# Patient Record
Sex: Female | Born: 1945
Health system: Southern US, Community
[De-identification: ages and names within clinical notes are randomized; demographics above are authoritative.]

## PROBLEM LIST (undated history)

## (undated) DIAGNOSIS — E785 Hyperlipidemia, unspecified: Secondary | ICD-10-CM

## (undated) DIAGNOSIS — F419 Anxiety disorder, unspecified: Secondary | ICD-10-CM

## (undated) DIAGNOSIS — R569 Unspecified convulsions: Secondary | ICD-10-CM

## (undated) DIAGNOSIS — I82409 Acute embolism and thrombosis of unspecified deep veins of unspecified lower extremity: Secondary | ICD-10-CM

## (undated) HISTORY — DX: Hyperlipidemia, unspecified: E78.5

## (undated) HISTORY — DX: Anxiety disorder, unspecified: F41.9

## (undated) HISTORY — PX: TUMOR EXCISION: SHX421

## (undated) HISTORY — DX: Unspecified convulsions: R56.9

---

## 2009-07-11 ENCOUNTER — Encounter: Admission: RE | Admit: 2009-07-11 | Discharge: 2009-07-11 | Payer: Self-pay | Admitting: Family Medicine

## 2010-02-25 ENCOUNTER — Encounter: Payer: Self-pay | Admitting: Gastroenterology

## 2012-02-27 DIAGNOSIS — D329 Benign neoplasm of meninges, unspecified: Secondary | ICD-10-CM | POA: Insufficient documentation

## 2012-11-12 ENCOUNTER — Other Ambulatory Visit: Payer: Self-pay | Admitting: *Deleted

## 2012-11-12 MED ORDER — ATORVASTATIN CALCIUM 40 MG PO TABS: 40.0000 mg | ORAL_TABLET | Freq: Every day | ORAL | Status: DC

## 2012-11-12 NOTE — Telephone Encounter (Signed)
LAST LABS 1/14. DISCUSSED LIPTRUZET ON LAB NOTE BUT PT STILL TAKING LIPITOR. LAST RF 10/11/12. NTBS

## 2012-11-12 NOTE — Telephone Encounter (Signed)
Need to know what pharmacy she uses to refill  lipitor

## 2013-01-05 ENCOUNTER — Encounter (INDEPENDENT_AMBULATORY_CARE_PROVIDER_SITE_OTHER): Payer: Self-pay

## 2013-01-05 ENCOUNTER — Encounter: Payer: Self-pay | Admitting: Family Medicine

## 2013-01-05 ENCOUNTER — Ambulatory Visit (INDEPENDENT_AMBULATORY_CARE_PROVIDER_SITE_OTHER): Payer: Medicare Other | Admitting: Family Medicine

## 2013-01-05 VITALS — BP 141/83 | HR 97 | Temp 96.5°F | Wt 171.0 lb

## 2013-01-05 DIAGNOSIS — J069 Acute upper respiratory infection, unspecified: Secondary | ICD-10-CM

## 2013-01-05 DIAGNOSIS — Z0000 Encounter for general adult medical examination without abnormal findings: Secondary | ICD-10-CM

## 2013-01-05 DIAGNOSIS — E785 Hyperlipidemia, unspecified: Secondary | ICD-10-CM

## 2013-01-05 MED ORDER — ATORVASTATIN CALCIUM 40 MG PO TABS: 40.0000 mg | ORAL_TABLET | Freq: Every day | ORAL | Status: DC

## 2013-01-05 MED ORDER — BENZONATATE 100 MG PO CAPS: 200.0000 mg | ORAL_CAPSULE | Freq: Three times a day (TID) | ORAL | Status: DC | PRN

## 2013-01-05 MED ORDER — AZITHROMYCIN 250 MG PO TABS: ORAL_TABLET | ORAL | Status: DC

## 2013-01-05 NOTE — Progress Notes (Signed)
   Subjective:    Patient ID: Luna Glasgow, female    DOB: [birth year], 67 y.o.   MRN: [id number]  HPI   This 67 y.o. female presents for evaluation of URI sx's and head congestion. Review of Systems No chest pain, SOB, HA, dizziness, vision change, N/V, diarrhea, constipation, dysuria, urinary urgency or frequency, myalgias, arthralgias or rash.     Objective:   Physical Exam  Vital signs noted  Well developed well nourished female.  HEENT - Head atraumatic Normocephalic                Eyes - PERRLA, Conjuctiva - clear Sclera- Clear EOMI                Ears - EAC's Wnl TM's Wnl Gross Hearing WNL                Throat - oropharanx wnl Respiratory - Lungs CTA bilateral Cardiac - RRR S1 and S2 without murmur GI - Abdomen soft Nontender and bowel sounds active x 4 Extremities - No edema. Neuro - Grossly intact.      Assessment & Plan:  URI (upper respiratory infection) - Plan: azithromycin (ZITHROMAX) 250 MG tablet, benzonatate (TESSALON PERLES) 100 MG capsule, CBC With differential/Platelet  Routine general medical examination at a health care facility - Plan: CMP14+EGFR, Lipid panel, Thyroid Panel With TSH, Vit D  25 hydroxy (rtn osteoporosis monitoring), CBC With differential/Platelet, CANCELED: POCT CBC  Hyperlipidemia - Plan: atorvastatin (LIPITOR) 40 MG tablet, CBC With differential/Platelet, DISCONTINUED: atorvastatin (LIPITOR) 40 MG tablet  Deatra Canter FNP

## 2013-01-06 ENCOUNTER — Other Ambulatory Visit: Payer: Self-pay | Admitting: Family Medicine

## 2013-01-06 LAB — CBC WITH DIFFERENTIAL
Basophils Absolute: 0 10*3/uL (ref 0.0–0.2)
Basos: 0 %
Eos: 2 %
Eosinophils Absolute: 0.2 10*3/uL (ref 0.0–0.4)
HCT: 35.1 % (ref 34.0–46.6)
Hemoglobin: 12 g/dL (ref 11.1–15.9)
Immature Grans (Abs): 0 10*3/uL (ref 0.0–0.1)
Immature Granulocytes: 0 %
Lymphocytes Absolute: 2.3 10*3/uL (ref 0.7–3.1)
Lymphs: 27 %
MCH: 27.3 pg (ref 26.6–33.0)
MCHC: 34.2 g/dL (ref 31.5–35.7)
MCV: 80 fL (ref 79–97)
Monocytes Absolute: 0.8 10*3/uL (ref 0.1–0.9)
Monocytes: 9 %
Neutrophils Absolute: 5.2 10*3/uL (ref 1.4–7.0)
Neutrophils Relative %: 62 %
Platelets: 241 10*3/uL (ref 150–379)
RBC: 4.4 x10E6/uL (ref 3.77–5.28)
RDW: 15.6 % — ABNORMAL HIGH (ref 12.3–15.4)
WBC: 8.5 10*3/uL (ref 3.4–10.8)

## 2013-01-06 LAB — CMP14+EGFR
ALT: 62 IU/L — ABNORMAL HIGH (ref 0–32)
AST: 49 IU/L — ABNORMAL HIGH (ref 0–40)
Albumin/Globulin Ratio: 1.5 (ref 1.1–2.5)
Albumin: 4.1 g/dL (ref 3.6–4.8)
Alkaline Phosphatase: 48 IU/L (ref 39–117)
BUN/Creatinine Ratio: 22 (ref 11–26)
BUN: 14 mg/dL (ref 8–27)
CO2: 25 mmol/L (ref 18–29)
Calcium: 10.1 mg/dL (ref 8.6–10.2)
Chloride: 101 mmol/L (ref 97–108)
Creatinine, Ser: 0.64 mg/dL (ref 0.57–1.00)
GFR calc Af Amer: 107 mL/min/{1.73_m2} (ref >59)
GFR calc non Af Amer: 93 mL/min/{1.73_m2} (ref >59)
Globulin, Total: 2.8 g/dL (ref 1.5–4.5)
Glucose: 98 mg/dL (ref 65–99)
Potassium: 5.4 mmol/L — ABNORMAL HIGH (ref 3.5–5.2)
Sodium: 141 mmol/L (ref 134–144)
Total Bilirubin: 0.2 mg/dL (ref 0.0–1.2)
Total Protein: 6.9 g/dL (ref 6.0–8.5)

## 2013-01-06 LAB — LIPID PANEL
Chol/HDL Ratio: 3.2 ratio units (ref 0.0–4.4)
Cholesterol, Total: 191 mg/dL (ref 100–199)
HDL: 60 mg/dL (ref >39)
LDL Calculated: 104 mg/dL — ABNORMAL HIGH (ref 0–99)
Triglycerides: 135 mg/dL (ref 0–149)
VLDL Cholesterol Cal: 27 mg/dL (ref 5–40)

## 2013-01-06 LAB — THYROID PANEL WITH TSH
Free Thyroxine Index: 2.2 (ref 1.2–4.9)
T3 Uptake Ratio: 28 % (ref 24–39)
T4, Total: 7.9 ug/dL (ref 4.5–12.0)
TSH: 1.83 u[IU]/mL (ref 0.450–4.500)

## 2013-01-06 LAB — VITAMIN D 25 HYDROXY (VIT D DEFICIENCY, FRACTURES): Vit D, 25-Hydroxy: 20.1 ng/mL — ABNORMAL LOW (ref 30.0–100.0)

## 2013-01-06 MED ORDER — VITAMIN D (ERGOCALCIFEROL) 1.25 MG (50000 UNIT) PO CAPS: 50000.0000 [IU] | ORAL_CAPSULE | ORAL | Status: DC

## 2013-01-06 NOTE — Patient Instructions (Signed)

## 2013-01-25 ENCOUNTER — Telehealth: Payer: Self-pay | Admitting: *Deleted

## 2013-01-25 NOTE — Telephone Encounter (Signed)
Message copied by Baltazar Apo on Mon Jan 25, 2013  2:23 PM ------      Message from: Deatra Canter      Created: Wed Jan 06, 2013  9:25 AM       Potassium is elevated and would repeat bmp, transaminases elevated so would repeat LFT and vitamin D is low so would take vitamin D otc 1000 iu poqd and will send in a rx for vitamin D q week ------

## 2013-01-25 NOTE — Telephone Encounter (Signed)
Pt notified and verbalized understanding.

## 2013-02-09 ENCOUNTER — Encounter: Payer: Self-pay | Admitting: *Deleted

## 2013-04-15 ENCOUNTER — Encounter: Payer: Self-pay | Admitting: Family Medicine

## 2013-04-15 ENCOUNTER — Ambulatory Visit (INDEPENDENT_AMBULATORY_CARE_PROVIDER_SITE_OTHER): Payer: Medicare HMO | Admitting: Family Medicine

## 2013-04-15 ENCOUNTER — Encounter (INDEPENDENT_AMBULATORY_CARE_PROVIDER_SITE_OTHER): Payer: Self-pay

## 2013-04-15 VITALS — BP 140/76 | HR 91 | Temp 98.1°F | Ht 62.0 in | Wt 172.0 lb

## 2013-04-15 DIAGNOSIS — J069 Acute upper respiratory infection, unspecified: Secondary | ICD-10-CM

## 2013-04-15 MED ORDER — AZITHROMYCIN 250 MG PO TABS: ORAL_TABLET | ORAL | Status: DC

## 2013-04-15 NOTE — Progress Notes (Signed)
   Subjective:    Patient ID: Christina Gonzales, female    DOB: 02/06/1945, 68 y.o.   MRN: [id number]  HPI This 68 y.o. female presents for evaluation of URI sx's for over a week.   Review of Systems No chest pain, SOB, HA, dizziness, vision change, N/V, diarrhea, constipation, dysuria, urinary urgency or frequency, myalgias, arthralgias or rash.     Objective:   Physical Exam  Vital signs noted  Well developed well nourished female.  HEENT - Head atraumatic Normocephalic                Eyes - PERRLA, Conjuctiva - clear Sclera- Clear EOMI                Ears - EAC's Wnl TM's Wnl Gross Hearing WNL                Nose - Nares patent                 Throat - oropharanx wnl Respiratory - Lungs CTA bilateral Cardiac - RRR S1 and S2 without murmur GI - Abdomen soft Nontender and bowel sounds active x 4 Extremities - No edema. Neuro - Grossly intact.      Assessment & Plan:  URI (upper respiratory infection) - Plan: azithromycin (ZITHROMAX) 250 MG tablet Push po fluids, rest, tylenol and motrin otc prn as directed for fever, arthralgias, and myalgias.  Follow up prn if sx's continue or persist. Lysbeth Penner FNP

## 2013-09-01 ENCOUNTER — Ambulatory Visit (INDEPENDENT_AMBULATORY_CARE_PROVIDER_SITE_OTHER): Payer: Medicare HMO | Admitting: Family

## 2013-09-01 ENCOUNTER — Encounter: Payer: Self-pay | Admitting: Family

## 2013-09-01 VITALS — BP 146/66 | HR 74 | Temp 98.0°F | Ht 62.0 in | Wt 180.8 lb

## 2013-09-01 DIAGNOSIS — R569 Unspecified convulsions: Secondary | ICD-10-CM

## 2013-09-01 DIAGNOSIS — Z1321 Encounter for screening for nutritional disorder: Secondary | ICD-10-CM

## 2013-09-01 DIAGNOSIS — E785 Hyperlipidemia, unspecified: Secondary | ICD-10-CM | POA: Insufficient documentation

## 2013-09-01 DIAGNOSIS — I10 Essential (primary) hypertension: Secondary | ICD-10-CM | POA: Insufficient documentation

## 2013-09-01 MED ORDER — HYDROCHLOROTHIAZIDE 12.5 MG PO TABS: 12.5000 mg | ORAL_TABLET | Freq: Every day | ORAL | Status: DC

## 2013-09-01 NOTE — Patient Instructions (Signed)
Health Maintenance Adopting a healthy lifestyle and getting preventive care can go a long way to promote health and wellness. Talk with your health care provider about what schedule of regular examinations is right for you. This is a good chance for you to check in with your provider about disease prevention and staying healthy. In between checkups, there are plenty of things you can do on your own. Experts have done a lot of research about which lifestyle changes and preventive measures are most likely to keep you healthy. Ask your health care provider for more information. WEIGHT AND DIET  Eat a healthy diet  Be sure to include plenty of vegetables, fruits, low-fat dairy products, and lean protein.  Do not eat a lot of foods high in solid fats, added sugars, or salt.  Get regular exercise. This is one of the most important things you can do for your health.  Most adults should exercise for at least 150 minutes each week. The exercise should increase your heart rate and make you sweat (moderate-intensity exercise).  Most adults should also do strengthening exercises at least twice a week. This is in addition to the moderate-intensity exercise.  Maintain a healthy weight  Body mass index (BMI) is a measurement that can be used to identify possible weight problems. It estimates body fat based on height and weight. Your health care provider can help determine your BMI and help you achieve or maintain a healthy weight.  For females 25 years of age and older:   A BMI below 18.5 is considered underweight.  A BMI of 18.5 to 24.9 is normal.  A BMI of 25 to 29.9 is considered overweight.  A BMI of 30 and above is considered obese.  Watch levels of cholesterol and blood lipids  You should start having your blood tested for lipids and cholesterol at 68 years of age, then have this test every 5 years.  You may need to have your cholesterol levels checked more often if:  Your lipid or  cholesterol levels are high.  You are older than 68 years of age.  You are at high risk for heart disease.  CANCER SCREENING   Lung Cancer  Lung cancer screening is recommended for adults 97-92 years old who are at high risk for lung cancer because of a history of smoking.  A yearly low-dose CT scan of the lungs is recommended for people who:  Currently smoke.  Have quit within the past 15 years.  Have at least a 30-pack-year history of smoking. A pack year is smoking an average of one pack of cigarettes a day for 1 year.  Yearly screening should continue until it has been 15 years since you quit.  Yearly screening should stop if you develop a health problem that would prevent you from having lung cancer treatment.  Breast Cancer  Practice breast self-awareness. This means understanding how your breasts normally appear and feel.  It also means doing regular breast self-exams. Let your health care provider know about any changes, no matter how small.  If you are in your 20s or 30s, you should have a clinical breast exam (CBE) by a health care provider every 1-3 years as part of a regular health exam.  If you are 76 or older, have a CBE every year. Also consider having a breast X-ray (mammogram) every year.  If you have a family history of breast cancer, talk to your health care provider about genetic screening.  If you are  at high risk for breast cancer, talk to your health care provider about having an MRI and a mammogram every year.  Breast cancer gene (BRCA) assessment is recommended for women who have family members with BRCA-related cancers. BRCA-related cancers include:  Breast.  Ovarian.  Tubal.  Peritoneal cancers.  Results of the assessment will determine the need for genetic counseling and BRCA1 and BRCA2 testing. Cervical Cancer Routine pelvic examinations to screen for cervical cancer are no longer recommended for nonpregnant women who are considered low  risk for cancer of the pelvic organs (ovaries, uterus, and vagina) and who do not have symptoms. A pelvic examination may be necessary if you have symptoms including those associated with pelvic infections. Ask your health care provider if a screening pelvic exam is right for you.   The Pap test is the screening test for cervical cancer for women who are considered at risk.  If you had a hysterectomy for a problem that was not cancer or a condition that could lead to cancer, then you no longer need Pap tests.  If you are older than 65 years, and you have had normal Pap tests for the past 10 years, you no longer need to have Pap tests.  If you have had past treatment for cervical cancer or a condition that could lead to cancer, you need Pap tests and screening for cancer for at least 20 years after your treatment.  If you no longer get a Pap test, assess your risk factors if they change (such as having a new sexual partner). This can affect whether you should start being screened again.  Some women have medical problems that increase their chance of getting cervical cancer. If this is the case for you, your health care provider may recommend more frequent screening and Pap tests.  The human papillomavirus (HPV) test is another test that may be used for cervical cancer screening. The HPV test looks for the virus that can cause cell changes in the cervix. The cells collected during the Pap test can be tested for HPV.  The HPV test can be used to screen women 30 years of age and older. Getting tested for HPV can extend the interval between normal Pap tests from three to five years.  An HPV test also should be used to screen women of any age who have unclear Pap test results.  After 68 years of age, women should have HPV testing as often as Pap tests.  Colorectal Cancer  This type of cancer can be detected and often prevented.  Routine colorectal cancer screening usually begins at 68 years of  age and continues through 68 years of age.  Your health care provider may recommend screening at an earlier age if you have risk factors for colon cancer.  Your health care provider may also recommend using home test kits to check for hidden blood in the stool.  A small camera at the end of a tube can be used to examine your colon directly (sigmoidoscopy or colonoscopy). This is done to check for the earliest forms of colorectal cancer.  Routine screening usually begins at age 50.  Direct examination of the colon should be repeated every 5-10 years through 68 years of age. However, you may need to be screened more often if early forms of precancerous polyps or small growths are found. Skin Cancer  Check your skin from head to toe regularly.  Tell your health care provider about any new moles or changes in   moles, especially if there is a change in a mole's shape or color.  Also tell your health care provider if you have a mole that is larger than the size of a pencil eraser.  Always use sunscreen. Apply sunscreen liberally and repeatedly throughout the day.  Protect yourself by wearing long sleeves, pants, a wide-brimmed hat, and sunglasses whenever you are outside. HEART DISEASE, DIABETES, AND HIGH BLOOD PRESSURE   Have your blood pressure checked at least every 1-2 years. High blood pressure causes heart disease and increases the risk of stroke.  If you are between 75 years and 42 years old, ask your health care provider if you should take aspirin to prevent strokes.  Have regular diabetes screenings. This involves taking a blood sample to check your fasting blood sugar level.  If you are at a normal weight and have a low risk for diabetes, have this test once every three years after 68 years of age.  If you are overweight and have a high risk for diabetes, consider being tested at a younger age or more often. PREVENTING INFECTION  Hepatitis B  If you have a higher risk for  hepatitis B, you should be screened for this virus. You are considered at high risk for hepatitis B if:  You were born in a country where hepatitis B is common. Ask your health care provider which countries are considered high risk.  Your parents were born in a high-risk country, and you have not been immunized against hepatitis B (hepatitis B vaccine).  You have HIV or AIDS.  You use needles to inject street drugs.  You live with someone who has hepatitis B.  You have had sex with someone who has hepatitis B.  You get hemodialysis treatment.  You take certain medicines for conditions, including cancer, organ transplantation, and autoimmune conditions. Hepatitis C  Blood testing is recommended for:  Everyone born from [birth year] through 1965.  Anyone with known risk factors for hepatitis C. Sexually transmitted infections (STIs)  You should be screened for sexually transmitted infections (STIs) including gonorrhea and chlamydia if:  You are sexually active and are younger than 68 years of age.  You are older than 68 years of age and your health care provider tells you that you are at risk for this type of infection.  Your sexual activity has changed since you were last screened and you are at an increased risk for chlamydia or gonorrhea. Ask your health care provider if you are at risk.  If you do not have HIV, but are at risk, it may be recommended that you take a prescription medicine daily to prevent HIV infection. This is called pre-exposure prophylaxis (PrEP). You are considered at risk if:  You are sexually active and do not regularly use condoms or know the HIV status of your partner(s).  You take drugs by injection.  You are sexually active with a partner who has HIV. Talk with your health care provider about whether you are at high risk of being infected with HIV. If you choose to begin PrEP, you should first be tested for HIV. You should then be tested every 3 months for  as long as you are taking PrEP.  PREGNANCY   If you are premenopausal and you may become pregnant, ask your health care provider about preconception counseling.  If you may become pregnant, take 400 to 800 micrograms (mcg) of folic acid every day.  If you want to prevent pregnancy, talk to your  health care provider about birth control (contraception). OSTEOPOROSIS AND MENOPAUSE   Osteoporosis is a disease in which the bones lose minerals and strength with aging. This can result in serious bone fractures. Your risk for osteoporosis can be identified using a bone density scan.  If you are 65 years of age or older, or if you are at risk for osteoporosis and fractures, ask your health care provider if you should be screened.  Ask your health care provider whether you should take a calcium or vitamin D supplement to lower your risk for osteoporosis.  Menopause may have certain physical symptoms and risks.  Hormone replacement therapy may reduce some of these symptoms and risks. Talk to your health care provider about whether hormone replacement therapy is right for you.  HOME CARE INSTRUCTIONS   Schedule regular health, dental, and eye exams.  Stay current with your immunizations.   Do not use any tobacco products including cigarettes, chewing tobacco, or electronic cigarettes.  If you are pregnant, do not drink alcohol.  If you are breastfeeding, limit how much and how often you drink alcohol.  Limit alcohol intake to no more than 1 drink per day for nonpregnant women. One drink equals 12 ounces of beer, 5 ounces of wine, or 1 ounces of hard liquor.  Do not use street drugs.  Do not share needles.  Ask your health care provider for help if you need support or information about quitting drugs.  Tell your health care provider if you often feel depressed.  Tell your health care provider if you have ever been abused or do not feel safe at home. Document Released: 08/06/2010  Document Revised: 06/07/2013 Document Reviewed: 12/23/2012 Exit[employer] Patient Information 2015 [employer]. This information is not intended to replace advice given to you by your health care provider. Make sure you discuss any questions you have with your health care provider. Hypertension Hypertension, commonly called high blood pressure, is when the force of blood pumping through your arteries is too strong. Your arteries are the blood vessels that carry blood from your heart throughout your body. A blood pressure reading consists of a higher number over a lower number, such as 110/72. The higher number (systolic) is the pressure inside your arteries when your heart pumps. The lower number (diastolic) is the pressure inside your arteries when your heart relaxes. Ideally you want your blood pressure below 120/80. Hypertension forces your heart to work harder to pump blood. Your arteries may become narrow or stiff. Having hypertension puts you at risk for heart disease, stroke, and other problems.  RISK FACTORS Some risk factors for high blood pressure are controllable. Others are not.  Risk factors you cannot control include:   Race. You may be at higher risk if you are African American.  Age. Risk increases with age.  Gender. Men are at higher risk than women before age 45 years. After age 65, women are at higher risk than men. Risk factors you can control include:  Not getting enough exercise or physical activity.  Being overweight.  Getting too much fat, sugar, calories, or salt in your diet.  Drinking too much alcohol. SIGNS AND SYMPTOMS Hypertension does not usually cause signs or symptoms. Extremely high blood pressure (hypertensive crisis) may cause headache, anxiety, shortness of breath, and nosebleed. DIAGNOSIS  To check if you have hypertension, your health care provider will measure your blood pressure while you are seated, with your arm held at the level of your heart. It    should be measured at least twice using the same arm. Certain conditions can cause a difference in blood pressure between your right and left arms. A blood pressure reading that is higher than normal on one occasion does not mean that you need treatment. If one blood pressure reading is high, ask your health care provider about having it checked again. TREATMENT  Treating high blood pressure includes making lifestyle changes and possibly taking medicine. Living a healthy lifestyle can help lower high blood pressure. You may need to change some of your habits. Lifestyle changes may include:  Following the DASH diet. This diet is high in fruits, vegetables, and whole grains. It is low in salt, red meat, and added sugars.  Getting at least 2 hours of brisk physical activity every week.  Losing weight if necessary.  Not smoking.  Limiting alcoholic beverages.  Learning ways to reduce stress. If lifestyle changes are not enough to get your blood pressure under control, your health care provider may prescribe medicine. You may need to take more than one. Work closely with your health care provider to understand the risks and benefits. HOME CARE INSTRUCTIONS  Have your blood pressure rechecked as directed by your health care provider.   Take medicines only as directed by your health care provider. Follow the directions carefully. Blood pressure medicines must be taken as prescribed. The medicine does not work as well when you skip doses. Skipping doses also puts you at risk for problems.   Do not smoke.   Monitor your blood pressure at home as directed by your health care provider. SEEK MEDICAL CARE IF:   You think you are having a reaction to medicines taken.  You have recurrent headaches or feel dizzy.  You have swelling in your ankles.  You have trouble with your vision. SEEK IMMEDIATE MEDICAL CARE IF:  You develop a severe headache or confusion.  You have unusual weakness,  numbness, or feel faint.  You have severe chest or abdominal pain.  You vomit repeatedly.  You have trouble breathing. MAKE SURE YOU:   Understand these instructions.  Will watch your condition.  Will get help right away if you are not doing well or get worse. Document Released: 01/21/2005 Document Revised: 06/07/2013 Document Reviewed: 11/13/2012 Exit[employer] Patient Information 2015 [employer]. This information is not intended to replace advice given to you by your health care provider. Make sure you discuss any questions you have with your health care provider.  

## 2013-09-01 NOTE — Progress Notes (Signed)
   Subjective:    Patient ID: Christina Gonzales, female    DOB: Apr 07, 1945, 68 y.o.   MRN: [id number]  Hyperlipidemia This is a chronic problem. The current episode started more than 1 year ago. The problem is uncontrolled. Recent lipid tests were reviewed and are high. She has no history of diabetes or hypothyroidism. Pertinent negatives include no shortness of breath. Current antihyperlipidemic treatment includes diet change. The current treatment provides no improvement of lipids. Compliance problems include adherence to diet and medication side effects.  Risk factors for coronary artery disease include dyslipidemia, hypertension and post-menopausal.  Hypertension This is a new problem. The current episode started more than 1 month ago. The problem has been waxing and waning since onset. The problem is uncontrolled. Pertinent negatives include no anxiety, headaches, palpitations, peripheral edema or shortness of breath. Risk factors for coronary artery disease include dyslipidemia, obesity and post-menopausal state. Past treatments include nothing. The current treatment provides no improvement. There is no history of kidney disease, CAD/MI, heart failure or a thyroid problem. There is no history of sleep apnea.  Pt has hx of seizures-States last seizure was 8 months ago. Pt has a neurologists in [place] that manages her keppra doses.     Review of Systems  Constitutional: Negative.   HENT: Negative.   Eyes: Negative.   Respiratory: Negative.  Negative for shortness of breath.   Cardiovascular: Negative.  Negative for palpitations.  Gastrointestinal: Negative.   Endocrine: Negative.   Genitourinary: Negative.   Musculoskeletal: Negative.   Neurological: Negative.  Negative for headaches.  Hematological: Negative.   Psychiatric/Behavioral: Negative.   All other systems reviewed and are negative.      Objective:   Physical Exam  Vitals reviewed. Constitutional: She is oriented  to person, place, and time. She appears well-developed and well-nourished. No distress.  HENT:  Head: Normocephalic and atraumatic.  Right Ear: External ear normal.  Mouth/Throat: Oropharynx is clear and moist.  Eyes: Pupils are equal, round, and reactive to light.  Neck: Normal range of motion. Neck supple. No thyromegaly present.  Cardiovascular: Normal rate, regular rhythm, normal heart sounds and intact distal pulses.   No murmur heard. Pulmonary/Chest: Effort normal and breath sounds normal. No respiratory distress. She has no wheezes.  Abdominal: Soft. Bowel sounds are normal. She exhibits no distension. There is no tenderness.  Musculoskeletal: Normal range of motion. She exhibits no edema and no tenderness.  Neurological: She is alert and oriented to person, place, and time. She has normal reflexes. No cranial nerve deficit.  Skin: Skin is warm and dry.  Psychiatric: She has a normal mood and affect. Her behavior is normal. Judgment and thought content normal.    BP 146/66  Pulse 74  Temp(Src) 98 F (36.7 C) (Oral)  Ht _0  (1.575 m)  Wt 180 lb 12.8 oz (82.01 kg)  BMI 33.06 kg/m2       Assessment & Plan:  1. Hyperlipidemia - CMP14+EGFR; Future - Lipid panel  2. Essential hypertension, benign - CMP14+EGFR; Future - hydrochlorothiazide (HYDRODIURIL) 12.5 MG tablet; Take 1 tablet (12.5 mg total) by mouth daily.  Dispense: 90 tablet; Refill: 3  3. Seizures - CMP14+EGFR; Future  4. Encounter for vitamin deficiency screening - Vit D  25 hydroxy (rtn osteoporosis monitoring); Future   Continue all meds Labs pending Health Maintenance reviewed Diet and exercise encouraged RTO 2 weeks for blood pressure  [name], FNP

## 2013-11-12 ENCOUNTER — Telehealth: Payer: Self-pay | Admitting: Family Medicine

## 2013-11-16 ENCOUNTER — Ambulatory Visit (INDEPENDENT_AMBULATORY_CARE_PROVIDER_SITE_OTHER): Payer: Medicare HMO | Admitting: Family

## 2013-11-16 ENCOUNTER — Encounter: Payer: Self-pay | Admitting: Family

## 2013-11-16 VITALS — BP 157/79 | HR 79 | Temp 97.8°F | Ht 62.0 in | Wt 180.8 lb

## 2013-11-16 DIAGNOSIS — I10 Essential (primary) hypertension: Secondary | ICD-10-CM

## 2013-11-16 MED ORDER — LISINOPRIL 20 MG PO TABS: 20.0000 mg | ORAL_TABLET | Freq: Every day | ORAL | Status: DC

## 2013-11-16 NOTE — Patient Instructions (Signed)

## 2013-11-16 NOTE — Progress Notes (Signed)
   Subjective:    Patient ID: Christina Gonzales, female    DOB: 05/14/1945, 68 y.o.   MRN: [id number]  Hypertension This is a recurrent problem. The current episode started more than 1 month ago. The problem has been waxing and waning since onset. The problem is uncontrolled. Pertinent negatives include no blurred vision, headaches, palpitations, peripheral edema or shortness of breath. Risk factors for coronary artery disease include dyslipidemia, obesity, post-menopausal state and family history. Past treatments include diuretics. The current treatment provides mild improvement. There is no history of kidney disease, CAD/MI, CVA, heart failure or a thyroid problem. There is no history of sleep apnea.      Review of Systems  Constitutional: Negative.   HENT: Negative.   Eyes: Negative.  Negative for blurred vision.  Respiratory: Negative.  Negative for shortness of breath.   Cardiovascular: Negative.  Negative for palpitations.  Gastrointestinal: Negative.   Endocrine: Negative.   Genitourinary: Negative.   Musculoskeletal: Negative.   Neurological: Negative.  Negative for headaches.  Hematological: Negative.   Psychiatric/Behavioral: Negative.   All other systems reviewed and are negative.      Objective:   Physical Exam  Vitals reviewed. Constitutional: She is oriented to person, place, and time. She appears well-developed and well-nourished. No distress.  Eyes: Pupils are equal, round, and reactive to light.  Neck: Normal range of motion. Neck supple. No thyromegaly present.  Cardiovascular: Normal rate, regular rhythm, normal heart sounds and intact distal pulses.   No murmur heard. Pulmonary/Chest: Effort normal and breath sounds normal. No respiratory distress. She has no wheezes.  Abdominal: Soft. Bowel sounds are normal. She exhibits no distension. There is no tenderness.  Musculoskeletal: Normal range of motion. She exhibits no edema and no tenderness.  Neurological:  She is alert and oriented to person, place, and time. She has normal reflexes. No cranial nerve deficit.  Skin: Skin is warm and dry.  Psychiatric: She has a normal mood and affect. Her behavior is normal. Judgment and thought content normal.    BP 157/79  Pulse 79  Temp(Src) 97.8 F (36.6 C)  Ht 5\' 2"  (1.575 m)  Wt 180 lb 12.8 oz (82.01 kg)  BMI 33.06 kg/m2       Assessment & Plan:  1. Essential hypertension, benign -Daily blood pressure log given with instructions on how to fill out and told to bring to next visit -Dash diet information given -Exercise encouraged - Stress Management  -Continue current meds -RTO in 2 weeks for nurse to check BP - lisinopril (PRINIVIL,ZESTRIL) 20 MG tablet; Take 1 tablet (20 mg total) by mouth daily.  Dispense: 90 tablet; Refill: [place], FNP

## 2013-11-16 NOTE — Telephone Encounter (Signed)
Patient was seen today.

## 2013-11-17 ENCOUNTER — Other Ambulatory Visit: Payer: Medicare HMO

## 2013-12-03 ENCOUNTER — Encounter: Payer: Self-pay | Admitting: Nurse Practitioner

## 2013-12-03 ENCOUNTER — Ambulatory Visit (INDEPENDENT_AMBULATORY_CARE_PROVIDER_SITE_OTHER): Payer: Medicare HMO | Admitting: Nurse Practitioner

## 2013-12-03 VITALS — BP 129/70 | HR 71 | Temp 97.1°F | Ht 62.0 in | Wt 179.6 lb

## 2013-12-03 DIAGNOSIS — I10 Essential (primary) hypertension: Secondary | ICD-10-CM

## 2013-12-03 DIAGNOSIS — Z1321 Encounter for screening for nutritional disorder: Secondary | ICD-10-CM

## 2013-12-03 DIAGNOSIS — E785 Hyperlipidemia, unspecified: Secondary | ICD-10-CM

## 2013-12-03 DIAGNOSIS — R569 Unspecified convulsions: Secondary | ICD-10-CM

## 2013-12-03 NOTE — Progress Notes (Signed)
   Subjective:    Patient ID: Christina Gonzales, female    DOB: [birth year], 68 y.o.   MRN: [id number]  HPI Patient here today for recheck of blood pressure. She was put on lisinopril several weeks ago and needs recheck- SHe says thatshe feels better and energy level has increased- Denies any side effects.    Review of Systems  Constitutional: Negative.   HENT: Negative.   Respiratory: Negative.   Cardiovascular: Negative.   Genitourinary: Negative.   Psychiatric/Behavioral: Negative.   All other systems reviewed and are negative.      Objective:   Physical Exam  Constitutional: She is oriented to person, place, and time. She appears well-developed and well-nourished.  Cardiovascular: Normal rate, regular rhythm and normal heart sounds.   Pulmonary/Chest: Effort normal.  Neurological: She is alert and oriented to person, place, and time.  Skin: Skin is warm and dry.  Psychiatric: She has a normal mood and affect. Her behavior is normal. Judgment and thought content normal.    BP 129/70  Pulse 71  Temp(Src) 97.1 F (36.2 C) (Oral)  Ht 5\' 2"  (1.575 m)  Wt 179 lb 9.6 oz (81.466 kg)  BMI 32.84 kg/m2       Assessment & Plan:  1. Essential hypertension, benign Low fat low NA+ diet  Labs pending Health maintenance reviewed Diet and exercise encouraged Continue all meds Follow up  In 3 months   [place], FNP

## 2013-12-03 NOTE — Addendum Note (Signed)
Addended by: Selmer Dominion on: 12/03/2013 09:48 AM   Modules accepted: Orders

## 2013-12-03 NOTE — Patient Instructions (Signed)

## 2013-12-04 LAB — CMP14+EGFR
A/G RATIO: 1.6 (ref 1.1–2.5)
ALBUMIN: 4.2 g/dL (ref 3.6–4.8)
ALT: 34 IU/L — ABNORMAL HIGH (ref 0–32)
AST: 29 IU/L (ref 0–40)
Alkaline Phosphatase: 33 IU/L — ABNORMAL LOW (ref 39–117)
BILIRUBIN TOTAL: 0.3 mg/dL (ref 0.0–1.2)
BUN / CREAT RATIO: 25 (ref 11–26)
BUN: 18 mg/dL (ref 8–27)
CO2: 21 mmol/L (ref 18–29)
Calcium: 9.2 mg/dL (ref 8.7–10.3)
Chloride: 102 mmol/L (ref 97–108)
Creatinine, Ser: 0.72 mg/dL (ref 0.57–1.00)
GFR, EST AFRICAN AMERICAN: 100 mL/min/{1.73_m2} (ref >59)
GFR, EST NON AFRICAN AMERICAN: 86 mL/min/{1.73_m2} (ref >59)
GLUCOSE: 100 mg/dL — AB (ref 65–99)
Globulin, Total: 2.7 g/dL (ref 1.5–4.5)
Potassium: 4.6 mmol/L (ref 3.5–5.2)
Sodium: 139 mmol/L (ref 134–144)
TOTAL PROTEIN: 6.9 g/dL (ref 6.0–8.5)

## 2013-12-04 LAB — LIPID PANEL
CHOL/HDL RATIO: 4.3 ratio (ref 0.0–4.4)
Cholesterol, Total: 251 mg/dL — ABNORMAL HIGH (ref 100–199)
HDL: 58 mg/dL (ref >39)
LDL Calculated: 167 mg/dL — ABNORMAL HIGH (ref 0–99)
Triglycerides: 130 mg/dL (ref 0–149)
VLDL CHOLESTEROL CAL: 26 mg/dL (ref 5–40)

## 2013-12-04 LAB — VITAMIN D 25 HYDROXY (VIT D DEFICIENCY, FRACTURES): Vit D, 25-Hydroxy: 20 ng/mL — ABNORMAL LOW (ref 30.0–100.0)

## 2013-12-14 ENCOUNTER — Telehealth: Payer: Self-pay | Admitting: Nurse Practitioner

## 2013-12-14 NOTE — Telephone Encounter (Signed)
Kidney and liver function stable ldl are up at 157- low fat diet and exercise Vitamin d low- vitamin d 2000iu OTC daily  Continue current meds- low fat diet and exercise and recheck in 3 months

## 2013-12-14 NOTE — Telephone Encounter (Signed)
Can you review labs for 12/03/13 and send back to pool to call pt?

## 2013-12-15 NOTE — Telephone Encounter (Signed)
Stp reviewed results and provider feedback, advised to follow up in 3 months for repeat labs, pt voiced understanding, will close call-kc

## 2014-01-02 ENCOUNTER — Other Ambulatory Visit: Payer: Self-pay | Admitting: Family

## 2014-01-02 DIAGNOSIS — E785 Hyperlipidemia, unspecified: Secondary | ICD-10-CM

## 2014-01-02 MED ORDER — PRAVASTATIN SODIUM 20 MG PO TABS: 20.0000 mg | ORAL_TABLET | Freq: Every day | ORAL | Status: DC

## 2014-04-06 ENCOUNTER — Ambulatory Visit (INDEPENDENT_AMBULATORY_CARE_PROVIDER_SITE_OTHER): Payer: Medicare HMO | Admitting: Family Medicine

## 2014-04-06 VITALS — BP 148/70 | HR 86 | Temp 98.9°F | Ht 62.0 in | Wt 184.6 lb

## 2014-04-06 DIAGNOSIS — J0101 Acute recurrent maxillary sinusitis: Secondary | ICD-10-CM | POA: Diagnosis not present

## 2014-04-06 MED ORDER — LEVOFLOXACIN 750 MG PO TABS: 750.0000 mg | ORAL_TABLET | Freq: Every day | ORAL | Status: DC

## 2014-04-06 MED ORDER — PSEUDOEPHEDRINE-GUAIFENESIN ER 60-600 MG PO TB12: 1.0000 | ORAL_TABLET | Freq: Two times a day (BID) | ORAL | Status: DC

## 2014-04-06 NOTE — Progress Notes (Signed)
Subjective:  Patient ID: [name], female    DOB: 1945-05-13  Age: 69 y.o. MRN: [id number]  CC: URI   HPI JAZZY PARMER presents for Sinus Pain: Patient complains of congestion, facial pain, headache described as frontal, moderate pressure, nasal congestion,  non productive cough, post nasal drip and sinus pressure with no fever, chills, night sweats or weight loss. Onset of symptoms was 7days ago, gradually worsening since that time. She is drinking moderate amounts of fluids.  Past history is significant for occasionalsinus infections. History Ashyla has a past medical history of Anxiety; Hyperlipidemia; and Seizures.   She has past surgical history that includes Tumor excision.   Her family history is not on file.She reports that she has never smoked. She does not have any smokeless tobacco history on file. Her alcohol and drug histories are not on file.  Current Outpatient Prescriptions on File Prior to Visit  Medication Sig Dispense Refill  . cholecalciferol (VITAMIN D) 1000 UNITS tablet Take 1,000 Units by mouth daily.    [name] levETIRAcetam (KEPPRA) 500 MG tablet Take 500 mg by mouth 2 (two) times daily.     [name] lisinopril (PRINIVIL,ZESTRIL) 20 MG tablet Take 1 tablet (20 mg total) by mouth daily. 90 tablet 3  . Multiple Vitamins tablet Take 1 tablet by mouth.    . Omega-3 1000 MG CAPS Take 1 g by mouth.    . [DISCONTINUED] hydrochlorothiazide (HYDRODIURIL) 12.5 MG tablet Take 1 tablet (12.5 mg total) by mouth daily. 90 tablet 3   No current facility-administered medications on file prior to visit.    ROS Review of Systems  Constitutional: Negative for fever, chills, diaphoresis, appetite change and fatigue.  HENT: Negative for congestion, ear pain, hearing loss, postnasal drip, rhinorrhea, sore throat and trouble swallowing.   Respiratory: Negative for cough, chest tightness and shortness of breath.   Cardiovascular: Negative for chest pain and palpitations.    Gastrointestinal: Negative for abdominal pain.  Musculoskeletal: Negative for arthralgias.  Skin: Negative for rash.    Objective:  BP 148/70 mmHg  Pulse 86  Temp(Src) 98.9 F (37.2 C) (Oral)  Ht 5\' 2"  (1.575 m)  Wt 184 lb 9.6 oz (83.734 kg)  BMI 33.76 kg/m2  BP Readings from Last 3 Encounters:  04/06/14 148/70  12/03/13 129/70  11/16/13 157/79    Wt Readings from Last 3 Encounters:  04/06/14 184 lb 9.6 oz (83.734 kg)  12/03/13 179 lb 9.6 oz (81.466 kg)  11/16/13 180 lb 12.8 oz (82.01 kg)     Physical Exam  Constitutional: She appears well-developed and well-nourished.  HENT:  Head: Normocephalic and atraumatic.  Right Ear: Tympanic membrane and external ear normal. No decreased hearing is noted.  Left Ear: Tympanic membrane and external ear normal. No decreased hearing is noted.  Nose: Mucosal edema present. Right sinus exhibits maxillary sinus tenderness. Right sinus exhibits no frontal sinus tenderness. Left sinus exhibits maxillary sinus tenderness. Left sinus exhibits no frontal sinus tenderness.  Mouth/Throat: No oropharyngeal exudate or posterior oropharyngeal erythema.  Neck: No Brudzinski's sign noted.  Pulmonary/Chest: No respiratory distress. She has no rales. She exhibits no tenderness.  Lymphadenopathy:       Head (right side): No preauricular adenopathy present.       Head (left side): No preauricular adenopathy present.       Right cervical: No superficial cervical adenopathy present.      Left cervical: No superficial cervical adenopathy present.    No results found for: HGBA1C  Lab Results  Component Value Date   WBC 8.5 01/05/2013   HGB 12.0 01/05/2013   HCT 35.1 01/05/2013   PLT 241 01/05/2013   GLUCOSE 100* 12/03/2013   CHOL 251* 12/03/2013   TRIG 130 12/03/2013   HDL 58 12/03/2013   LDLCALC 167* 12/03/2013   ALT 34* 12/03/2013   AST 29 12/03/2013   NA 139 12/03/2013   K 4.6 12/03/2013   CL 102 12/03/2013   CREATININE 0.72 12/03/2013    BUN 18 12/03/2013   CO2 21 12/03/2013   TSH 1.830 01/05/2013    US Venous Imaging Unilateral Left  07/11/2009   Clinical Data: Left leg pain, swelling   LEFT LOWER EXTREMITY VENOUS DUPLEX ULTRASOUND   Technique:  Gray-scale sonography with graded compression, as well as color Doppler and duplex ultrasound were performed to evaluate the deep venous system of the lower extremity from the level of the common femoral vein through the popliteal and proximal calf veins. Spectral Doppler was utilized to evaluate flow at rest and with distal augmentation maneuvers.   Comparison:  None available   Findings:  Normal compressibility of the common femoral, superficial femoral, and popliteal veins is demonstrated, as well as the visualized proximal calf veins.  No filling defects to suggest DVT on grayscale or color Doppler imaging.  Doppler waveforms show normal direction of venous flow, normal respiratory phasicity and response to augmentation.   IMPRESSION: No evidence of lower extremity deep vein thrombosis.  Provider: [place]:   Katerra was seen today for uri.  Diagnoses and all orders for this visit:  Acute recurrent maxillary sinusitis  Other orders -     levofloxacin (LEVAQUIN) 750 MG tablet; Take 1 tablet (750 mg total) by mouth daily. -     pseudoephedrine-guaifenesin (MUCINEX D) 60-600 MG per tablet; Take 1 tablet by mouth every 12 (twelve) hours.  I have discontinued Ms. Wilbon's pyridOXINE and pravastatin. I am also having her start on levofloxacin and pseudoephedrine-guaifenesin. Additionally, I am having her maintain her levETIRAcetam, Multiple Vitamins, Omega-3, cholecalciferol, and lisinopril.  Meds ordered this encounter  Medications  . levofloxacin (LEVAQUIN) 750 MG tablet    Sig: Take 1 tablet (750 mg total) by mouth daily.    Dispense:  5 tablet    Refill:  0  . pseudoephedrine-guaifenesin (MUCINEX D) 60-600 MG per tablet    Sig: Take 1 tablet by  mouth every 12 (twelve) hours.    Dispense:  20 tablet    Refill:  0      Follow-up: Return if symptoms worsen or fail to improve.  Claretta Fraise, M.D.

## 2014-04-07 ENCOUNTER — Telehealth: Payer: Self-pay | Admitting: Family Medicine

## 2014-04-07 MED ORDER — SULFAMETHOXAZOLE-TRIMETHOPRIM 800-160 MG PO TABS: 1.0000 | ORAL_TABLET | Freq: Two times a day (BID) | ORAL | Status: DC

## 2014-04-07 NOTE — Telephone Encounter (Signed)
Patient prescribed Levaquin and it is too expensive. Can you change to something else?

## 2014-04-07 NOTE — Telephone Encounter (Signed)
Done, please tell patient 

## 2014-04-08 NOTE — Telephone Encounter (Signed)
Left detailed message on patients answering machine that another rx was sent to pharmacy

## 2014-09-20 ENCOUNTER — Encounter: Payer: Self-pay | Admitting: Nurse Practitioner

## 2014-09-20 ENCOUNTER — Ambulatory Visit (INDEPENDENT_AMBULATORY_CARE_PROVIDER_SITE_OTHER): Payer: Medicare HMO | Admitting: Nurse Practitioner

## 2014-09-20 VITALS — BP 149/83 | HR 76 | Temp 98.6°F | Ht 62.0 in | Wt 168.4 lb

## 2014-09-20 DIAGNOSIS — R040 Epistaxis: Secondary | ICD-10-CM | POA: Diagnosis not present

## 2014-09-20 DIAGNOSIS — I10 Essential (primary) hypertension: Secondary | ICD-10-CM

## 2014-09-20 DIAGNOSIS — J0100 Acute maxillary sinusitis, unspecified: Secondary | ICD-10-CM | POA: Diagnosis not present

## 2014-09-20 MED ORDER — LISINOPRIL 20 MG PO TABS: 20.0000 mg | ORAL_TABLET | Freq: Every day | ORAL | Status: DC

## 2014-09-20 MED ORDER — AZITHROMYCIN 250 MG PO TABS: ORAL_TABLET | ORAL | Status: DC

## 2014-09-20 NOTE — Patient Instructions (Signed)

## 2014-09-20 NOTE — Progress Notes (Signed)
   Subjective:    Patient ID: Christina Gonzales, female    DOB: 12-20-1945, 69 y.o.   MRN: [id number]  HPI Patient in this evening c/o nose bleed intermittently for 2 weeks- last about 5 minutes when occurs- has nit done anything for it. She also has cough and congestion with slight facial pain.    Review of Systems  Constitutional: Negative for fever and chills.  HENT: Positive for congestion and sinus pressure. Negative for ear pain, postnasal drip, rhinorrhea and sore throat.   Respiratory: Positive for cough (productive at times- clear).   Cardiovascular: Negative.   Gastrointestinal: Negative.   Genitourinary: Negative.   Neurological: Negative.   Psychiatric/Behavioral: Negative.   All other systems reviewed and are negative.      Objective:   Physical Exam  Constitutional: She is oriented to person, place, and time. She appears well-developed and well-nourished.  HENT:  Right Ear: Hearing, tympanic membrane, external ear and ear canal normal.  Left Ear: Hearing, tympanic membrane, external ear and ear canal normal.  Nose: Mucosal edema and rhinorrhea present. Epistaxis is observed.  Mouth/Throat: Uvula is midline, oropharynx is clear and moist and mucous membranes are normal.  Neck: Normal range of motion.  Cardiovascular: Normal rate, regular rhythm and normal heart sounds.   Pulmonary/Chest: Effort normal and breath sounds normal.  Neurological: She is alert and oriented to person, place, and time.  Skin: Skin is warm and dry.  Psychiatric: She has a normal mood and affect. Her behavior is normal. Judgment and thought content normal.   BP 149/83 mmHg  Pulse 76  Temp(Src) 98.6 F (37 C) (Oral)  Ht 5\' 2"  (1.575 m)  Wt 168 lb 6.4 oz (76.386 kg)  BMI 30.79 kg/m2  * cauterization of area in right nostril       Assessment & Plan:  1. Epistaxis Triple antibiotic ointment in nostril Qhs RTO if bleeding still occuring on regular bais  2. Acute maxillary  sinusitis, recurrence not specified 1. Take meds as prescribed 2. Use a cool mist humidifier especially during the winter months and when heat has been humid. 3. Use saline nose sprays frequently 4. Saline irrigations of the nose can be very helpful if done frequently.  * 4X daily for 1 week*  * Use of a nettie pot can be helpful with this. Follow directions with this* 5. Drink plenty of fluids 6. Keep thermostat turn down low 7.For any cough or congestion  Use plain Mucinex- regular strength or max strength is fine   * Children- consult with Pharmacist for dosing 8. For fever or aces or pains- take tylenol or ibuprofen appropriate for age and weight.  * for fevers greater than 101 orally you may alternate ibuprofen and tylenol every  3 hours.   Mary-Margaret Hassell Done, FNP  - azithromycin (ZITHROMAX Z-PAK) 250 MG tablet; As directed  Dispense: 1 each; Refill: 0

## 2014-09-20 NOTE — Addendum Note (Signed)
Addended by: Chevis Pretty on: 09/20/2014 06:27 PM   Modules accepted: Orders

## 2014-09-26 ENCOUNTER — Telehealth: Payer: Self-pay | Admitting: Nurse Practitioner

## 2014-09-26 NOTE — Telephone Encounter (Signed)
Instructed pt to add Mucinex - regular or maximum strength for the cough, had not yet and informed her the shot is Prevnar 13 for pneumonia

## 2014-11-14 ENCOUNTER — Ambulatory Visit (INDEPENDENT_AMBULATORY_CARE_PROVIDER_SITE_OTHER): Payer: Medicare HMO | Admitting: Family

## 2014-11-14 ENCOUNTER — Telehealth: Payer: Self-pay | Admitting: Family

## 2014-11-14 ENCOUNTER — Encounter: Payer: Self-pay | Admitting: Family

## 2014-11-14 VITALS — BP 143/83 | HR 75 | Temp 97.5°F | Ht 62.0 in | Wt 164.2 lb

## 2014-11-14 DIAGNOSIS — Z23 Encounter for immunization: Secondary | ICD-10-CM | POA: Diagnosis not present

## 2014-11-14 DIAGNOSIS — F411 Generalized anxiety disorder: Secondary | ICD-10-CM

## 2014-11-14 DIAGNOSIS — R69 Illness, unspecified: Secondary | ICD-10-CM | POA: Diagnosis not present

## 2014-11-14 MED ORDER — ESCITALOPRAM OXALATE 10 MG PO TABS: 10.0000 mg | ORAL_TABLET | Freq: Every day | ORAL | Status: DC

## 2014-11-14 NOTE — Progress Notes (Signed)
   Subjective:    Patient ID: Christina Gonzales, female    DOB: [birth year], 69 y.o.   MRN: [id number]  HPI Pt presents to the office today for GAD. PT states her daughter's husband died and her mother-in-law died within the last 2-3 months. Pt states her daughter lives out of the state and her daughter is calling the patient for support. Pt states she just feels overwhelmed and feels like she needs something to help keep her calm. Pt states she is trying to get her daughter to move down her with her. Pt states she is worried about her daughter, because she has issues with alcohol. Pt states she does not want her health to be effected.    Review of Systems  Constitutional: Negative.   HENT: Negative.   Eyes: Negative.   Respiratory: Negative.  Negative for shortness of breath.   Cardiovascular: Negative.  Negative for palpitations.  Gastrointestinal: Negative.   Endocrine: Negative.   Genitourinary: Negative.   Musculoskeletal: Negative.   Neurological: Negative.  Negative for headaches.  Hematological: Negative.   Psychiatric/Behavioral: Negative.   All other systems reviewed and are negative.      Objective:   Physical Exam  Constitutional: She is oriented to person, place, and time. She appears well-developed and well-nourished. No distress.  HENT:  Head: Normocephalic and atraumatic.  Right Ear: External ear normal.  Left Ear: External ear normal.  Nose: Nose normal.  Mouth/Throat: Oropharynx is clear and moist.  Eyes: Pupils are equal, round, and reactive to light.  Neck: Normal range of motion. Neck supple. No thyromegaly present.  Cardiovascular: Normal rate, regular rhythm, normal heart sounds and intact distal pulses.   No murmur heard. Pulmonary/Chest: Effort normal and breath sounds normal. No respiratory distress. She has no wheezes.  Abdominal: Soft. Bowel sounds are normal. She exhibits no distension. There is no tenderness.  Musculoskeletal: Normal range of  motion. She exhibits no edema or tenderness.  Neurological: She is alert and oriented to person, place, and time. She has normal reflexes. No cranial nerve deficit.  Skin: Skin is warm and dry.  Psychiatric: She has a normal mood and affect. Her behavior is normal. Judgment and thought content normal.  Vitals reviewed.   BP 143/83 mmHg  Pulse 75  Temp(Src) 97.5 F (36.4 C) (Oral)  Ht 5\' 2"  (1.575 m)  Wt 164 lb 3.2 oz (74.481 kg)  BMI 30.03 kg/m2       Assessment & Plan:  1. GAD (generalized anxiety disorder) -Stress management discussed -PT to take lexapro daily -RTO in 2 months- Will increase lexapro as needed - escitalopram (LEXAPRO) 10 MG tablet; Take 1 tablet (10 mg total) by mouth daily.  Dispense: 90 tablet; Refill: 3  -Prevnar and Flu vaccine given today  [name], FNP

## 2014-11-14 NOTE — Patient Instructions (Signed)
Generalized Anxiety Disorder Generalized anxiety disorder (GAD) is a mental disorder. It interferes with life functions, including relationships, work, and school. GAD is different from normal anxiety, which everyone experiences at some point in their lives in response to specific life events and activities. Normal anxiety actually helps us prepare for and get through these life events and activities. Normal anxiety goes away after the event or activity is over.  GAD causes anxiety that is not necessarily related to specific events or activities. It also causes excess anxiety in proportion to specific events or activities. The anxiety associated with GAD is also difficult to control. GAD can vary from mild to severe. People with severe GAD can have intense waves of anxiety with physical symptoms (panic attacks).  SYMPTOMS The anxiety and worry associated with GAD are difficult to control. This anxiety and worry are related to many life events and activities and also occur more days than not for 6 months or longer. People with GAD also have three or more of the following symptoms (one or more in children):  Restlessness.   Fatigue.  Difficulty concentrating.   Irritability.  Muscle tension.  Difficulty sleeping or unsatisfying sleep. DIAGNOSIS GAD is diagnosed through an assessment by your health care provider. Your health care provider will ask you questions aboutyour mood,physical symptoms, and events in your life. Your health care provider may ask you about your medical history and use of alcohol or drugs, including prescription medicines. Your health care provider may also do a physical exam and blood tests. Certain medical conditions and the use of certain substances can cause symptoms similar to those associated with GAD. Your health care provider may refer you to a mental health specialist for further evaluation. TREATMENT The following therapies are usually used to treat GAD:    Medication. Antidepressant medication usually is prescribed for long-term daily control. Antianxiety medicines may be added in severe cases, especially when panic attacks occur.   Talk therapy (psychotherapy). Certain types of talk therapy can be helpful in treating GAD by providing support, education, and guidance. A form of talk therapy called cognitive behavioral therapy can teach you healthy ways to think about and react to daily life events and activities.  Stress managementtechniques. These include yoga, meditation, and exercise and can be very helpful when they are practiced regularly. A mental health specialist can help determine which treatment is best for you. Some people see improvement with one therapy. However, other people require a combination of therapies.   This information is not intended to replace advice given to you by your health care provider. Make sure you discuss any questions you have with your health care provider.   Document Released: 05/18/2012 Document Revised: 02/11/2014 Document Reviewed: 05/18/2012 Elsevier Interactive Patient Education 2016 [employer]  

## 2014-11-15 MED ORDER — SERTRALINE HCL 100 MG PO TABS: 100.0000 mg | ORAL_TABLET | Freq: Every day | ORAL | Status: DC

## 2014-11-15 NOTE — Telephone Encounter (Signed)
Left message stating that new Rx was sent to the pharmacy.

## 2014-11-15 NOTE — Telephone Encounter (Signed)
New RX sent to pharmacy.

## 2014-12-15 ENCOUNTER — Ambulatory Visit (INDEPENDENT_AMBULATORY_CARE_PROVIDER_SITE_OTHER): Payer: Medicare HMO | Admitting: Nurse Practitioner

## 2014-12-15 ENCOUNTER — Encounter: Payer: Self-pay | Admitting: Nurse Practitioner

## 2014-12-15 VITALS — BP 160/78 | HR 72 | Temp 97.6°F | Ht 62.0 in | Wt 163.0 lb

## 2014-12-15 DIAGNOSIS — S8011XA Contusion of right lower leg, initial encounter: Secondary | ICD-10-CM | POA: Diagnosis not present

## 2014-12-15 NOTE — Patient Instructions (Signed)

## 2014-12-15 NOTE — Progress Notes (Signed)
   Subjective:    Patient ID: Christina Gonzales, female    DOB: 1945-07-21, 69 y.o.   MRN: [id number]  HPI Patient in today c/o right shin pain- said that she has hit it a couple of times on door. Sore to touch.    Review of Systems  Constitutional: Negative.   HENT: Negative.   Respiratory: Negative.   Cardiovascular: Negative.   Gastrointestinal: Negative.   Genitourinary: Negative.   Neurological: Negative.   Psychiatric/Behavioral: Negative.   All other systems reviewed and are negative.      Objective:   Physical Exam  Constitutional: She is oriented to person, place, and time. She appears well-developed and well-nourished.  Cardiovascular: Normal rate, regular rhythm and normal heart sounds.   Pulmonary/Chest: Effort normal and breath sounds normal.  Musculoskeletal:  Pain on palpation right anterior shin- no erythema - no echymosis  Neurological: She is alert and oriented to person, place, and time.  Skin: Skin is warm.  Psychiatric: She has a normal mood and affect. Her behavior is normal. Judgment and thought content normal.   BP 160/78 mmHg  Pulse 72  Temp(Src) 97.6 F (36.4 C) (Oral)  Ht 5\' 2"  (1.575 m)  Wt 163 lb (73.936 kg)  BMI 29.81 kg/m2        Assessment & Plan:   1. Contusion of right leg, initial encounter    Motrin OTC every 6 hours Ice Elevate when sitting Ace wrap if helps RTO prn  Mary-Margaret Hassell Done, FNP

## 2014-12-23 ENCOUNTER — Other Ambulatory Visit: Payer: Self-pay | Admitting: Nurse Practitioner

## 2015-01-02 DIAGNOSIS — D329 Benign neoplasm of meninges, unspecified: Secondary | ICD-10-CM | POA: Diagnosis not present

## 2015-01-02 DIAGNOSIS — Z9889 Other specified postprocedural states: Secondary | ICD-10-CM | POA: Diagnosis not present

## 2015-01-19 ENCOUNTER — Ambulatory Visit: Payer: Medicare HMO | Admitting: Family

## 2015-01-24 DIAGNOSIS — D329 Benign neoplasm of meninges, unspecified: Secondary | ICD-10-CM | POA: Diagnosis not present

## 2015-01-24 DIAGNOSIS — R569 Unspecified convulsions: Secondary | ICD-10-CM | POA: Diagnosis not present

## 2015-03-21 ENCOUNTER — Telehealth: Payer: Self-pay | Admitting: Nurse Practitioner

## 2015-03-22 NOTE — Telephone Encounter (Signed)
WILL CALL BACK TO SCHEDULE °

## 2015-05-26 ENCOUNTER — Encounter: Payer: Self-pay | Admitting: Family Medicine

## 2015-05-26 ENCOUNTER — Encounter (INDEPENDENT_AMBULATORY_CARE_PROVIDER_SITE_OTHER): Payer: Self-pay

## 2015-05-26 ENCOUNTER — Ambulatory Visit (INDEPENDENT_AMBULATORY_CARE_PROVIDER_SITE_OTHER): Payer: Medicare HMO | Admitting: Family Medicine

## 2015-05-26 VITALS — BP 134/78 | HR 88 | Temp 97.8°F | Ht 62.0 in | Wt 168.0 lb

## 2015-05-26 DIAGNOSIS — J309 Allergic rhinitis, unspecified: Secondary | ICD-10-CM

## 2015-05-26 DIAGNOSIS — B079 Viral wart, unspecified: Secondary | ICD-10-CM | POA: Diagnosis not present

## 2015-05-26 MED ORDER — LISINOPRIL 20 MG PO TABS: 20.0000 mg | ORAL_TABLET | Freq: Every day | ORAL | Status: DC

## 2015-05-26 MED ORDER — FLUTICASONE PROPIONATE 50 MCG/ACT NA SUSP: 1.0000 | Freq: Two times a day (BID) | NASAL | Status: DC | PRN
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
## 2015-05-31 ENCOUNTER — Telehealth: Payer: Self-pay | Admitting: Nurse Practitioner

## 2015-05-31 NOTE — Telephone Encounter (Signed)
denied °

## 2015-08-09 ENCOUNTER — Encounter: Payer: Self-pay | Admitting: Family

## 2015-08-09 ENCOUNTER — Ambulatory Visit: Payer: Medicare HMO | Admitting: Physician Assistant

## 2015-08-09 ENCOUNTER — Ambulatory Visit (INDEPENDENT_AMBULATORY_CARE_PROVIDER_SITE_OTHER): Payer: Medicare HMO | Admitting: Family

## 2015-08-09 VITALS — BP 127/70 | HR 86 | Temp 98.8°F | Ht 62.0 in | Wt 166.0 lb

## 2015-08-09 DIAGNOSIS — F411 Generalized anxiety disorder: Secondary | ICD-10-CM | POA: Diagnosis not present

## 2015-08-09 MED ORDER — SERTRALINE HCL 100 MG PO TABS: 100.0000 mg | ORAL_TABLET | Freq: Every day | ORAL | Status: DC
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
## 2015-08-09 NOTE — Patient Instructions (Signed)
Stress and Stress Management Stress is a normal reaction to life events. It is what you feel when life demands more than you are used to or more than you can handle. Some stress can be useful. For example, the stress reaction can help you catch the last bus of the day, study for a test, or meet a deadline at work. But stress that occurs too often or for too long can cause problems. It can affect your emotional health and interfere with relationships and normal daily activities. Too much stress can weaken your immune system and increase your risk for physical illness. If you already have a medical problem, stress can make it worse. CAUSES  All sorts of life events may cause stress. An event that causes stress for one person may not be stressful for another person. Major life events commonly cause stress. These may be positive or negative. Examples include losing your job, moving into a new home, getting married, having a baby, or losing a loved one. Less obvious life events may also cause stress, especially if they occur day after day or in combination. Examples include working long hours, driving in traffic, caring for children, being in debt, or being in a difficult relationship. SIGNS AND SYMPTOMS Stress may cause emotional symptoms including, the following:  Anxiety. This is feeling worried, afraid, on edge, overwhelmed, or out of control.  Anger. This is feeling irritated or impatient.  Depression. This is feeling sad, down, helpless, or guilty.  Difficulty focusing, remembering, or making decisions. Stress may cause physical symptoms, including the following:   Aches and pains. These may affect your head, neck, back, stomach, or other areas of your body.  Tight muscles or clenched jaw.  Low energy or trouble sleeping. Stress may cause unhealthy behaviors, including the following:   Eating to feel better (overeating) or skipping meals.  Sleeping too little, too much, or both.  Working  too much or putting off tasks (procrastination).  Smoking, drinking alcohol, or using drugs to feel better. DIAGNOSIS  Stress is diagnosed through an assessment by your health care provider. Your health care provider will ask questions about your symptoms and any stressful life events.Your health care provider will also ask about your medical history and may order blood tests or other tests. Certain medical conditions and medicine can cause physical symptoms similar to stress. Mental illness can cause emotional symptoms and unhealthy behaviors similar to stress. Your health care provider may refer you to a mental health professional for further evaluation.  TREATMENT  Stress management is the recommended treatment for stress.The goals of stress management are reducing stressful life events and coping with stress in healthy ways.  Techniques for reducing stressful life events include the following:  Stress identification. Self-monitor for stress and identify what causes stress for you. These skills may help you to avoid some stressful events.  Time management. Set your priorities, keep a calendar of events, and learn to say "no." These tools can help you avoid making too many commitments. Techniques for coping with stress include the following:  Rethinking the problem. Try to think realistically about stressful events rather than ignoring them or overreacting. Try to find the positives in a stressful situation rather than focusing on the negatives.  Exercise. Physical exercise can release both physical and emotional tension. The key is to find a form of exercise you enjoy and do it regularly.  Relaxation techniques. These relax the body and mind. Examples include yoga, meditation, tai chi, biofeedback, deep  breathing, progressive muscle relaxation, listening to music, being out in nature, journaling, and other hobbies. Again, the key is to find one or more that you enjoy and can do  regularly.  Healthy lifestyle. Eat a balanced diet, get plenty of sleep, and do not smoke. Avoid using alcohol or drugs to relax.  Strong support network. Spend time with family, friends, or other people you enjoy being around.Express your feelings and talk things over with someone you trust. Counseling or talktherapy with a mental health professional may be helpful if you are having difficulty managing stress on your own. Medicine is typically not recommended for the treatment of stress.Talk to your health care provider if you think you need medicine for symptoms of stress. HOME CARE INSTRUCTIONS  Keep all follow-up visits as directed by your health care provider.  Take all medicines as directed by your health care provider. SEEK MEDICAL CARE IF:  Your symptoms get worse or you start having new symptoms.  You feel overwhelmed by your problems and can no longer manage them on your own. SEEK IMMEDIATE MEDICAL CARE IF:  You feel like hurting yourself or someone else.   This information is not intended to replace advice given to you by your health care provider. Make sure you discuss any questions you have with your health care provider.   Document Released: 07/17/2000 Document Revised: 02/11/2014 Document Reviewed: 09/15/2012 Elsevier Interactive Patient Education 2016 [employer]  

## 2015-08-15 DIAGNOSIS — R569 Unspecified convulsions: Secondary | ICD-10-CM | POA: Diagnosis not present

## 2015-08-15 DIAGNOSIS — D329 Benign neoplasm of meninges, unspecified: Secondary | ICD-10-CM | POA: Diagnosis not present

## 2015-09-04 ENCOUNTER — Encounter: Payer: Medicare HMO | Admitting: *Deleted

## 2015-09-07 ENCOUNTER — Encounter: Payer: Self-pay | Admitting: Family

## 2015-09-07 ENCOUNTER — Ambulatory Visit (INDEPENDENT_AMBULATORY_CARE_PROVIDER_SITE_OTHER): Payer: Medicare HMO | Admitting: Family

## 2015-09-07 VITALS — BP 139/72 | HR 81 | Temp 98.5°F | Ht 62.0 in | Wt 164.4 lb

## 2015-09-07 DIAGNOSIS — F411 Generalized anxiety disorder: Secondary | ICD-10-CM | POA: Diagnosis not present

## 2015-09-07 DIAGNOSIS — F32A Depression, unspecified: Secondary | ICD-10-CM

## 2015-09-07 DIAGNOSIS — F329 Major depressive disorder, single episode, unspecified: Secondary | ICD-10-CM

## 2015-09-07 MED ORDER — LISINOPRIL 20 MG PO TABS: 20.0000 mg | ORAL_TABLET | Freq: Every day | ORAL | 1 refills | Status: DC

## 2015-09-07 NOTE — Progress Notes (Signed)
   Subjective:    Patient ID: Christina Gonzales, female    DOB: 1945-06-20, 70 y.o.   MRN: [id number]  Pt presents to the office today recheck GAD. PT was placed on zoloft 100 mg. PT states she is doing better. Depression       The patient presents with depression.  This is a chronic problem.  The current episode started more than 1 year ago.   The onset quality is gradual.   The problem occurs intermittently.  The problem has been rapidly improving since onset.  Associated symptoms include no helplessness, no hopelessness, does not have insomnia, no restlessness, no decreased interest, no headaches, not sad and no suicidal ideas.     The symptoms are aggravated by family issues.  Past treatments include SSRIs - Selective serotonin reuptake inhibitors.  Past compliance problems include medical issues.  Risk factors include major life event.   Past medical history includes anxiety and depression.   Anxiety  Presents for follow-up visit. Onset was 1 to 5 years ago. Patient reports no depressed mood, excessive worry, insomnia, irritability, nervous/anxious behavior, palpitations, panic, restlessness, shortness of breath or suicidal ideas. Symptoms occur rarely. The severity of symptoms is moderate. The symptoms are aggravated by family issues. The quality of sleep is good. Nighttime awakenings: occasional.   Her past medical history is significant for anxiety/panic attacks and depression. Past treatments include nothing.      Review of Systems  Constitutional: Negative for irritability.  HENT: Negative.   Eyes: Negative.   Respiratory: Negative.  Negative for shortness of breath.   Cardiovascular: Negative.  Negative for palpitations.  Gastrointestinal: Negative.   Endocrine: Negative.   Genitourinary: Negative.   Musculoskeletal: Negative.   Neurological: Negative.  Negative for headaches.  Hematological: Negative.   Psychiatric/Behavioral: Positive for depression. Negative for suicidal  ideas. The patient is not nervous/anxious and does not have insomnia.   All other systems reviewed and are negative.      Objective:   Physical Exam  Constitutional: She is oriented to person, place, and time. She appears well-developed and well-nourished. No distress.  HENT:  Head: Normocephalic and atraumatic.  Right Ear: External ear normal.  Cardiovascular: Normal rate, regular rhythm, normal heart sounds and intact distal pulses.   No murmur heard. Pulmonary/Chest: Effort normal and breath sounds normal. No respiratory distress. She has no wheezes.  Abdominal: Soft. Bowel sounds are normal. She exhibits no distension. There is no tenderness.  Musculoskeletal: Normal range of motion. She exhibits no edema or tenderness.  Neurological: She is alert and oriented to person, place, and time. She has normal reflexes. No cranial nerve deficit.  Skin: Skin is warm and dry.  Psychiatric: She has a normal mood and affect. Her behavior is normal. Judgment and thought content normal.  Vitals reviewed.     BP 139/72   Pulse 81   Temp 98.5 F (36.9 C) (Oral)   Ht 5\' 2"  (1.575 m)   Wt 164 lb 6.4 oz (74.6 kg)   BMI 30.07 kg/m      Assessment & Plan:  1. GAD (generalized anxiety disorder)  2. Depression  Continue zoloft at current dose Stress management discussed RTO in 6 months   [name], FNP

## 2015-09-07 NOTE — Patient Instructions (Signed)
Generalized Anxiety Disorder Generalized anxiety disorder (GAD) is a mental disorder. It interferes with life functions, including relationships, work, and school. GAD is different from normal anxiety, which everyone experiences at some point in their lives in response to specific life events and activities. Normal anxiety actually helps us prepare for and get through these life events and activities. Normal anxiety goes away after the event or activity is over.  GAD causes anxiety that is not necessarily related to specific events or activities. It also causes excess anxiety in proportion to specific events or activities. The anxiety associated with GAD is also difficult to control. GAD can vary from mild to severe. People with severe GAD can have intense waves of anxiety with physical symptoms (panic attacks).  SYMPTOMS The anxiety and worry associated with GAD are difficult to control. This anxiety and worry are related to many life events and activities and also occur more days than not for 6 months or longer. People with GAD also have three or more of the following symptoms (one or more in children):  Restlessness.   Fatigue.  Difficulty concentrating.   Irritability.  Muscle tension.  Difficulty sleeping or unsatisfying sleep. DIAGNOSIS GAD is diagnosed through an assessment by your health care provider. Your health care provider will ask you questions aboutyour mood,physical symptoms, and events in your life. Your health care provider may ask you about your medical history and use of alcohol or drugs, including prescription medicines. Your health care provider may also do a physical exam and blood tests. Certain medical conditions and the use of certain substances can cause symptoms similar to those associated with GAD. Your health care provider may refer you to a mental health specialist for further evaluation. TREATMENT The following therapies are usually used to treat GAD:    Medication. Antidepressant medication usually is prescribed for long-term daily control. Antianxiety medicines may be added in severe cases, especially when panic attacks occur.   Talk therapy (psychotherapy). Certain types of talk therapy can be helpful in treating GAD by providing support, education, and guidance. A form of talk therapy called cognitive behavioral therapy can teach you healthy ways to think about and react to daily life events and activities.  Stress managementtechniques. These include yoga, meditation, and exercise and can be very helpful when they are practiced regularly. A mental health specialist can help determine which treatment is best for you. Some people see improvement with one therapy. However, other people require a combination of therapies.   This information is not intended to replace advice given to you by your health care provider. Make sure you discuss any questions you have with your health care provider.   Document Released: 05/18/2012 Document Revised: 02/11/2014 Document Reviewed: 05/18/2012 Elsevier Interactive Patient Education 2016 [employer]  

## 2015-12-25 DIAGNOSIS — R69 Illness, unspecified: Secondary | ICD-10-CM | POA: Diagnosis not present

## 2016-04-10 ENCOUNTER — Encounter: Payer: Self-pay | Admitting: Physician Assistant

## 2016-04-10 ENCOUNTER — Ambulatory Visit (INDEPENDENT_AMBULATORY_CARE_PROVIDER_SITE_OTHER): Payer: Medicare HMO | Admitting: Physician Assistant

## 2016-04-10 ENCOUNTER — Encounter (INDEPENDENT_AMBULATORY_CARE_PROVIDER_SITE_OTHER): Payer: Self-pay

## 2016-04-10 VITALS — BP 124/61 | HR 85 | Temp 97.6°F | Ht 62.0 in | Wt 172.0 lb

## 2016-04-10 DIAGNOSIS — N3001 Acute cystitis with hematuria: Secondary | ICD-10-CM | POA: Diagnosis not present

## 2016-04-10 DIAGNOSIS — R300 Dysuria: Secondary | ICD-10-CM

## 2016-04-10 LAB — MICROSCOPIC EXAMINATION: Renal Epithel, UA: NONE SEEN /hpf

## 2016-04-10 LAB — URINALYSIS, COMPLETE
Bilirubin, UA: NEGATIVE
GLUCOSE, UA: NEGATIVE
KETONES UA: NEGATIVE
NITRITE UA: NEGATIVE
SPEC GRAV UA: 1.01 (ref 1.005–1.030)
UUROB: 0.2 mg/dL (ref 0.2–1.0)
pH, UA: 7 (ref 5.0–7.5)

## 2016-04-10 MED ORDER — SULFAMETHOXAZOLE-TRIMETHOPRIM 800-160 MG PO TABS: 1.0000 | ORAL_TABLET | Freq: Two times a day (BID) | ORAL | 0 refills | Status: DC

## 2016-04-10 MED ORDER — LISINOPRIL 20 MG PO TABS: 20.0000 mg | ORAL_TABLET | Freq: Every day | ORAL | 3 refills | Status: DC

## 2016-04-10 NOTE — Patient Instructions (Signed)

## 2016-04-10 NOTE — Progress Notes (Signed)
BP 124/61   Pulse 85   Temp 97.6 F (36.4 C) (Oral)   Ht 5\' 2"  (1.575 m)   Wt 172 lb (78 kg)   BMI 31.46 kg/m    Subjective:    Patient ID: Christina Gonzales, female    DOB: January 14, 1946, 71 y.o.   MRN: [id number]  HPI: Christina Gonzales is a 71 y.o. female presenting on 04/10/2016 for Urinary Frequency and Hematuria  This patient has had a little over 2 days of increased frequency and urgency. And at times she'll have very minimal output. She has even had nocturia or the sense to go at night but still very little voiding at times. She is very tender over the suprapubic region. She denies any fever or chills at this time.  Relevant past medical, surgical, family and social history reviewed and updated as indicated. Allergies and medications reviewed and updated.  Past Medical History:  Diagnosis Date  . Anxiety   . Hyperlipidemia   . Seizures ([place])     Past Surgical History:  Procedure Laterality Date  . TUMOR EXCISION     Behind left eye    Review of Systems  Constitutional: Negative.   HENT: Negative.   Eyes: Negative.   Respiratory: Negative.   Gastrointestinal: Negative.   Genitourinary: Positive for difficulty urinating, dysuria and urgency. Negative for flank pain.    Allergies as of 04/10/2016      Reactions   Heparin Nausea And Vomiting   Warfarin Other (See Comments)   unknown   Penicillins Rash      Medication List       Accurate as of 04/10/16 11:15 AM. Always use your most recent med list.          cholecalciferol 1000 units tablet Commonly known as:  VITAMIN D Take 1,000 Units by mouth daily. Reported on 05/26/2015   fluticasone 50 MCG/ACT nasal spray Commonly known as:  FLONASE Place 1 spray into both nostrils 2 (two) times daily as needed for allergies or rhinitis.   lisinopril 20 MG tablet Commonly known as:  PRINIVIL,ZESTRIL Take 1 tablet (20 mg total) by mouth daily.   Multiple Vitamins tablet Take 1 tablet by mouth. Reported on  05/26/2015   Omega-3 1000 MG Caps Take 1 g by mouth. Reported on 05/26/2015   sulfamethoxazole-trimethoprim 800-160 MG tablet Commonly known as:  BACTRIM DS Take 1 tablet by mouth 2 (two) times daily.          Objective:    BP 124/61   Pulse 85   Temp 97.6 F (36.4 C) (Oral)   Ht 5\' 2"  (1.575 m)   Wt 172 lb (78 kg)   BMI 31.46 kg/m   Allergies  Allergen Reactions  . Heparin Nausea And Vomiting  . Warfarin Other (See Comments)    unknown  . Penicillins Rash    Physical Exam  Constitutional: She is oriented to person, place, and time. She appears well-developed and well-nourished.  HENT:  Head: Normocephalic and atraumatic.  Eyes: Conjunctivae are normal. Pupils are equal, round, and reactive to light.  Cardiovascular: Normal rate, regular rhythm, normal heart sounds and intact distal pulses.   Pulmonary/Chest: Effort normal and breath sounds normal.  Abdominal: Soft. Bowel sounds are normal. She exhibits no distension and no mass. There is tenderness in the suprapubic area. There is no rebound, no guarding and no CVA tenderness.  Neurological: She is alert and oriented to person, place, and time. She has normal reflexes.  Skin: Skin is warm and dry. No rash noted.  Psychiatric: She has a normal mood and affect. Her behavior is normal. Judgment and thought content normal.    Results for orders placed or performed in visit on 04/10/16  Microscopic Examination  Result Value Ref Range   WBC, UA >30 (A) 0 - 5 /hpf   RBC, UA >30 (A) 0 - 2 /hpf   Epithelial Cells (non renal) 0-10 0 - 10 /hpf   Renal Epithel, UA None seen None seen /hpf   Bacteria, UA Moderate (A) None seen/Few  Urinalysis, Complete  Result Value Ref Range   Specific Gravity, UA 1.010 1.005 - 1.030   pH, UA 7.0 5.0 - 7.5   Color, UA Brown (A) Yellow   Appearance Ur Hazy (A) Clear   Leukocytes, UA 2+ (A) Negative   Protein, UA 1+ (A) Negative/Trace   Glucose, UA Negative Negative   Ketones, UA  Negative Negative   RBC, UA 3+ (A) Negative   Bilirubin, UA Negative Negative   Urobilinogen, Ur 0.2 0.2 - 1.0 mg/dL   Nitrite, UA Negative Negative   Microscopic Examination See below:       Assessment & Plan:   1. Dysuria - Urine culture - Urinalysis, Complete - Microscopic Examination  2. Acute cystitis with hematuria - sulfamethoxazole-trimethoprim (BACTRIM DS) 800-160 MG tablet; Take 1 tablet by mouth 2 (two) times daily.  Dispense: 14 tablet; Refill: 0   Current Outpatient Prescriptions:  .  cholecalciferol (VITAMIN D) 1000 UNITS tablet, Take 1,000 Units by mouth daily. Reported on 05/26/2015, Disp: , Rfl:  .  fluticasone (FLONASE) 50 MCG/ACT nasal spray, Place 1 spray into both nostrils 2 (two) times daily as needed for allergies or rhinitis., Disp: 16 g, Rfl: 6 .  lisinopril (PRINIVIL,ZESTRIL) 20 MG tablet, Take 1 tablet (20 mg total) by mouth daily., Disp: 90 tablet, Rfl: 3 .  Multiple Vitamins tablet, Take 1 tablet by mouth. Reported on 05/26/2015, Disp: , Rfl:  .  Omega-3 1000 MG CAPS, Take 1 g by mouth. Reported on 05/26/2015, Disp: , Rfl:  .  sulfamethoxazole-trimethoprim (BACTRIM DS) 800-160 MG tablet, Take 1 tablet by mouth 2 (two) times daily., Disp: 14 tablet, Rfl: 0  Continue all other maintenance medications as listed above.  Follow up plan: Return if symptoms worsen or fail to improve.  Educational handout given for UTI  Terald Sleeper PA-C [facility] [address]  [place], [place] [address] [phone]   04/10/2016, 11:15 AM

## 2016-04-12 LAB — URINE CULTURE

## 2016-05-06 ENCOUNTER — Telehealth: Payer: Self-pay | Admitting: Nurse Practitioner

## 2016-05-06 NOTE — Telephone Encounter (Signed)
scheduled

## 2016-05-10 ENCOUNTER — Ambulatory Visit (INDEPENDENT_AMBULATORY_CARE_PROVIDER_SITE_OTHER): Payer: Medicare HMO | Admitting: Family

## 2016-05-10 ENCOUNTER — Encounter: Payer: Self-pay | Admitting: Family

## 2016-05-10 VITALS — BP 145/91 | HR 80 | Temp 96.4°F | Ht 62.0 in | Wt 172.8 lb

## 2016-05-10 DIAGNOSIS — E785 Hyperlipidemia, unspecified: Secondary | ICD-10-CM | POA: Diagnosis not present

## 2016-05-10 DIAGNOSIS — E669 Obesity, unspecified: Secondary | ICD-10-CM | POA: Diagnosis not present

## 2016-05-10 DIAGNOSIS — Z1231 Encounter for screening mammogram for malignant neoplasm of breast: Secondary | ICD-10-CM

## 2016-05-10 DIAGNOSIS — Z1211 Encounter for screening for malignant neoplasm of colon: Secondary | ICD-10-CM | POA: Diagnosis not present

## 2016-05-10 DIAGNOSIS — R69 Illness, unspecified: Secondary | ICD-10-CM | POA: Diagnosis not present

## 2016-05-10 DIAGNOSIS — I10 Essential (primary) hypertension: Secondary | ICD-10-CM

## 2016-05-10 DIAGNOSIS — Z1239 Encounter for other screening for malignant neoplasm of breast: Secondary | ICD-10-CM

## 2016-05-10 DIAGNOSIS — F411 Generalized anxiety disorder: Secondary | ICD-10-CM | POA: Diagnosis not present

## 2016-05-10 DIAGNOSIS — E663 Overweight: Secondary | ICD-10-CM | POA: Insufficient documentation

## 2016-05-10 NOTE — Progress Notes (Signed)
Subjective:    Patient ID: Christina Gonzales, female    DOB: February 01, 1946, 71 y.o.   MRN: [id number]  Pt presents to the office today for chronic follow up.  Hypertension  This is a chronic problem. The current episode started more than 1 year ago. The problem has been waxing and waning since onset. The problem is uncontrolled. Associated symptoms include anxiety. Pertinent negatives include no headaches, malaise/fatigue, palpitations, peripheral edema or shortness of breath. Risk factors for coronary artery disease include obesity and sedentary lifestyle. Past treatments include ACE inhibitors. The current treatment provides moderate improvement. There is no history of kidney disease, CAD/MI or heart failure.  Depression       The patient presents with depression.  This is a chronic problem.  The current episode started more than 1 year ago.   The onset quality is gradual.   The problem occurs intermittently.  The problem has been resolved since onset.  Associated symptoms include no helplessness, no hopelessness, does not have insomnia, no restlessness, no decreased interest, no headaches, not sad and no suicidal ideas.     The symptoms are aggravated by family issues.  Past treatments include nothing.  Past compliance problems include medical issues.  Risk factors include major life event.   Past medical history includes anxiety and depression.   Anxiety  Presents for follow-up visit. Onset was 1 to 5 years ago. Patient reports no depressed mood, excessive worry, insomnia, irritability, nervous/anxious behavior, palpitations, panic, restlessness, shortness of breath or suicidal ideas. Symptoms occur rarely. The severity of symptoms is moderate. The symptoms are aggravated by family issues. The quality of sleep is good. Nighttime awakenings: occasional.   Her past medical history is significant for anxiety/panic attacks and depression. Past treatments include nothing.  Hyperlipidemia  This is a  chronic problem. The current episode started more than 1 year ago. The problem is uncontrolled. Recent lipid tests were reviewed and are high. Exacerbating diseases include obesity. Pertinent negatives include no shortness of breath. Current antihyperlipidemic treatment includes diet change and herbal therapy. The current treatment provides no improvement of lipids. Risk factors for coronary artery disease include obesity, hypertension, a sedentary lifestyle and post-menopausal.      Review of Systems  Constitutional: Negative for irritability and malaise/fatigue.  HENT: Negative.   Eyes: Negative.   Respiratory: Negative.  Negative for shortness of breath.   Cardiovascular: Negative.  Negative for palpitations.  Gastrointestinal: Negative.   Endocrine: Negative.   Genitourinary: Negative.   Musculoskeletal: Negative.   Neurological: Negative.  Negative for headaches.  Hematological: Negative.   Psychiatric/Behavioral: Positive for depression. Negative for suicidal ideas. The patient is not nervous/anxious and does not have insomnia.   All other systems reviewed and are negative.      Objective:   Physical Exam  Constitutional: She is oriented to person, place, and time. She appears well-developed and well-nourished. No distress.  HENT:  Head: Normocephalic and atraumatic.  Right Ear: External ear normal.  Cardiovascular: Normal rate, regular rhythm, normal heart sounds and intact distal pulses.   No murmur heard. Pulmonary/Chest: Effort normal and breath sounds normal. No respiratory distress. She has no wheezes.  Abdominal: Soft. Bowel sounds are normal. She exhibits no distension. There is no tenderness.  Musculoskeletal: Normal range of motion. She exhibits no edema or tenderness.  Neurological: She is alert and oriented to person, place, and time. She has normal reflexes. No cranial nerve deficit.  Skin: Skin is warm and dry.  Psychiatric: She  has a normal mood and affect. Her  behavior is normal. Judgment and thought content normal.  Vitals reviewed.   BP (!) 148/85   Pulse 80   Temp (!) 96.4 F (35.8 C) (Oral)   Ht _0  (1.575 m)   Wt 172 lb 12.8 oz (78.4 kg)   BMI 31.61 kg/m      Assessment & Plan:  1. Hyperlipidemia, unspecified hyperlipidemia type - CMP14+EGFR - Lipid panel  2. GAD (generalized anxiety disorder) - CMP14+EGFR  3. Essential hypertension, benig - CMP14+EGFR  4. Obesity (BMI 30-39.9 - CMP14+EGFR  5. Colon cancer screening - CMP14+EGFR - Fecal occult blood, imunochemical; Future  6. Breast cancer screening - CMP14+EGFR - [facility] MAMMOGRAP[facility]   Continue all meds Labs pending Health Maintenance reviewed Diet and exercise encouraged RTO 6 months   [name], FNP

## 2016-05-10 NOTE — Patient Instructions (Signed)
Health Maintenance, Female Adopting a healthy lifestyle and getting preventive care can go a long way to promote health and wellness. Talk with your health care provider about what schedule of regular examinations is right for you. This is a good chance for you to check in with your provider about disease prevention and staying healthy. In between checkups, there are plenty of things you can do on your own. Experts have done a lot of research about which lifestyle changes and preventive measures are most likely to keep you healthy. Ask your health care provider for more information. Weight and diet Eat a healthy diet  Be sure to include plenty of vegetables, fruits, low-fat dairy products, and lean protein.  Do not eat a lot of foods high in solid fats, added sugars, or salt.  Get regular exercise. This is one of the most important things you can do for your health.  Most adults should exercise for at least 150 minutes each week. The exercise should increase your heart rate and make you sweat (moderate-intensity exercise).  Most adults should also do strengthening exercises at least twice a week. This is in addition to the moderate-intensity exercise. Maintain a healthy weight  Body mass index (BMI) is a measurement that can be used to identify possible weight problems. It estimates body fat based on height and weight. Your health care provider can help determine your BMI and help you achieve or maintain a healthy weight.  For females 76 years of age and older:  A BMI below 18.5 is considered underweight.  A BMI of 18.5 to 24.9 is normal.  A BMI of 25 to 29.9 is considered overweight.  A BMI of 30 and above is considered obese. Watch levels of cholesterol and blood lipids  You should start having your blood tested for lipids and cholesterol at 71 years of age, then have this test every 5 years.  You may need to have your cholesterol levels checked more often if:  Your lipid or  cholesterol levels are high.  You are older than 71 years of age.  You are at high risk for heart disease. Cancer screening Lung Cancer  Lung cancer screening is recommended for adults 64-42 years old who are at high risk for lung cancer because of a history of smoking.  A yearly low-dose CT scan of the lungs is recommended for people who:  Currently smoke.  Have quit within the past 15 years.  Have at least a 30-pack-year history of smoking. A pack year is smoking an average of one pack of cigarettes a day for 1 year.  Yearly screening should continue until it has been 15 years since you quit.  Yearly screening should stop if you develop a health problem that would prevent you from having lung cancer treatment. Breast Cancer  Practice breast self-awareness. This means understanding how your breasts normally appear and feel.  It also means doing regular breast self-exams. Let your health care provider know about any changes, no matter how small.  If you are in your 20s or 30s, you should have a clinical breast exam (CBE) by a health care provider every 1-3 years as part of a regular health exam.  If you are 34 or older, have a CBE every year. Also consider having a breast X-ray (mammogram) every year.  If you have a family history of breast cancer, talk to your health care provider about genetic screening.  If you are at high risk for breast cancer, talk  to your health care provider about having an MRI and a mammogram every year.  Breast cancer gene (BRCA) assessment is recommended for women who have family members with BRCA-related cancers. BRCA-related cancers include:  Breast.  Ovarian.  Tubal.  Peritoneal cancers.  Results of the assessment will determine the need for genetic counseling and BRCA1 and BRCA2 testing. Cervical Cancer  Your health care provider may recommend that you be screened regularly for cancer of the pelvic organs (ovaries, uterus, and vagina).  This screening involves a pelvic examination, including checking for microscopic changes to the surface of your cervix (Pap test). You may be encouraged to have this screening done every 3 years, beginning at age 24.  For women ages 66-65, health care providers may recommend pelvic exams and Pap testing every 3 years, or they may recommend the Pap and pelvic exam, combined with testing for human papilloma virus (HPV), every 5 years. Some types of HPV increase your risk of cervical cancer. Testing for HPV may also be done on women of any age with unclear Pap test results.  Other health care providers may not recommend any screening for nonpregnant women who are considered low risk for pelvic cancer and who do not have symptoms. Ask your health care provider if a screening pelvic exam is right for you.  If you have had past treatment for cervical cancer or a condition that could lead to cancer, you need Pap tests and screening for cancer for at least 20 years after your treatment. If Pap tests have been discontinued, your risk factors (such as having a new sexual partner) need to be reassessed to determine if screening should resume. Some women have medical problems that increase the chance of getting cervical cancer. In these cases, your health care provider may recommend more frequent screening and Pap tests. Colorectal Cancer  This type of cancer can be detected and often prevented.  Routine colorectal cancer screening usually begins at 71 years of age and continues through 71 years of age.  Your health care provider may recommend screening at an earlier age if you have risk factors for colon cancer.  Your health care provider may also recommend using home test kits to check for hidden blood in the stool.  A small camera at the end of a tube can be used to examine your colon directly (sigmoidoscopy or colonoscopy). This is done to check for the earliest forms of colorectal cancer.  Routine  screening usually begins at age 41.  Direct examination of the colon should be repeated every 5-10 years through 71 years of age. However, you may need to be screened more often if early forms of precancerous polyps or small growths are found. Skin Cancer  Check your skin from head to toe regularly.  Tell your health care provider about any new moles or changes in moles, especially if there is a change in a mole's shape or color.  Also tell your health care provider if you have a mole that is larger than the size of a pencil eraser.  Always use sunscreen. Apply sunscreen liberally and repeatedly throughout the day.  Protect yourself by wearing long sleeves, pants, a wide-brimmed hat, and sunglasses whenever you are outside. Heart disease, diabetes, and high blood pressure  High blood pressure causes heart disease and increases the risk of stroke. High blood pressure is more likely to develop in:  People who have blood pressure in the high end of the normal range (130-139/85-89 mm Hg).  People who are overweight or obese.  People who are African American.  If you are 59-24 years of age, have your blood pressure checked every 3-5 years. If you are 34 years of age or older, have your blood pressure checked every year. You should have your blood pressure measured twice-once when you are at a hospital or clinic, and once when you are not at a hospital or clinic. Record the average of the two measurements. To check your blood pressure when you are not at a hospital or clinic, you can use:  An automated blood pressure machine at a pharmacy.  A home blood pressure monitor.  If you are between 29 years and 60 years old, ask your health care provider if you should take aspirin to prevent strokes.  Have regular diabetes screenings. This involves taking a blood sample to check your fasting blood sugar level.  If you are at a normal weight and have a low risk for diabetes, have this test once  every three years after 71 years of age.  If you are overweight and have a high risk for diabetes, consider being tested at a younger age or more often. Preventing infection Hepatitis B  If you have a higher risk for hepatitis B, you should be screened for this virus. You are considered at high risk for hepatitis B if:  You were born in a country where hepatitis B is common. Ask your health care provider which countries are considered high risk.  Your parents were born in a high-risk country, and you have not been immunized against hepatitis B (hepatitis B vaccine).  You have HIV or AIDS.  You use needles to inject street drugs.  You live with someone who has hepatitis B.  You have had sex with someone who has hepatitis B.  You get hemodialysis treatment.  You take certain medicines for conditions, including cancer, organ transplantation, and autoimmune conditions. Hepatitis C  Blood testing is recommended for:  Everyone born from [birth year] through 1965.  Anyone with known risk factors for hepatitis C. Sexually transmitted infections (STIs)  You should be screened for sexually transmitted infections (STIs) including gonorrhea and chlamydia if:  You are sexually active and are younger than 71 years of age.  You are older than 70 years of age and your health care provider tells you that you are at risk for this type of infection.  Your sexual activity has changed since you were last screened and you are at an increased risk for chlamydia or gonorrhea. Ask your health care provider if you are at risk.  If you do not have HIV, but are at risk, it may be recommended that you take a prescription medicine daily to prevent HIV infection. This is called pre-exposure prophylaxis (PrEP). You are considered at risk if:  You are sexually active and do not regularly use condoms or know the HIV status of your partner(s).  You take drugs by injection.  You are sexually active with a partner  who has HIV. Talk with your health care provider about whether you are at high risk of being infected with HIV. If you choose to begin PrEP, you should first be tested for HIV. You should then be tested every 3 months for as long as you are taking PrEP. Pregnancy  If you are premenopausal and you may become pregnant, ask your health care provider about preconception counseling.  If you may become pregnant, take 400 to 800 micrograms (mcg) of folic acid  every day.  If you want to prevent pregnancy, talk to your health care provider about birth control (contraception). Osteoporosis and menopause  Osteoporosis is a disease in which the bones lose minerals and strength with aging. This can result in serious bone fractures. Your risk for osteoporosis can be identified using a bone density scan.  If you are 4 years of age or older, or if you are at risk for osteoporosis and fractures, ask your health care provider if you should be screened.  Ask your health care provider whether you should take a calcium or vitamin D supplement to lower your risk for osteoporosis.  Menopause may have certain physical symptoms and risks.  Hormone replacement therapy may reduce some of these symptoms and risks. Talk to your health care provider about whether hormone replacement therapy is right for you. Follow these instructions at home:  Schedule regular health, dental, and eye exams.  Stay current with your immunizations.  Do not use any tobacco products including cigarettes, chewing tobacco, or electronic cigarettes.  If you are pregnant, do not drink alcohol.  If you are breastfeeding, limit how much and how often you drink alcohol.  Limit alcohol intake to no more than 1 drink per day for nonpregnant women. One drink equals 12 ounces of beer, 5 ounces of wine, or 1 ounces of hard liquor.  Do not use street drugs.  Do not share needles.  Ask your health care provider for help if you need support  or information about quitting drugs.  Tell your health care provider if you often feel depressed.  Tell your health care provider if you have ever been abused or do not feel safe at home. This information is not intended to replace advice given to you by your health care provider. Make sure you discuss any questions you have with your health care provider. Document Released: 08/06/2010 Document Revised: 06/29/2015 Document Reviewed: 10/25/2014 Elsevier Interactive Patient Education  2017 [employer].

## 2016-05-11 LAB — LIPID PANEL
CHOL/HDL RATIO: 3.8 ratio (ref 0.0–4.4)
Cholesterol, Total: 238 mg/dL — ABNORMAL HIGH (ref 100–199)
HDL: 63 mg/dL (ref >39)
LDL CALC: 144 mg/dL — AB (ref 0–99)
TRIGLYCERIDES: 157 mg/dL — AB (ref 0–149)
VLDL Cholesterol Cal: 31 mg/dL (ref 5–40)

## 2016-05-11 LAB — CMP14+EGFR
ALBUMIN: 4 g/dL (ref 3.5–4.8)
ALK PHOS: 32 IU/L — AB (ref 39–117)
ALT: 58 IU/L — ABNORMAL HIGH (ref 0–32)
AST: 34 IU/L (ref 0–40)
Albumin/Globulin Ratio: 1.5 (ref 1.2–2.2)
BILIRUBIN TOTAL: 0.2 mg/dL (ref 0.0–1.2)
BUN / CREAT RATIO: 31 — AB (ref 12–28)
BUN: 21 mg/dL (ref 8–27)
CHLORIDE: 102 mmol/L (ref 96–106)
CO2: 25 mmol/L (ref 18–29)
CREATININE: 0.68 mg/dL (ref 0.57–1.00)
Calcium: 9.6 mg/dL (ref 8.7–10.3)
GFR calc Af Amer: 102 mL/min/{1.73_m2} (ref >59)
GFR calc non Af Amer: 88 mL/min/{1.73_m2} (ref >59)
GLUCOSE: 84 mg/dL (ref 65–99)
Globulin, Total: 2.7 g/dL (ref 1.5–4.5)
Potassium: 4.9 mmol/L (ref 3.5–5.2)
SODIUM: 142 mmol/L (ref 134–144)
Total Protein: 6.7 g/dL (ref 6.0–8.5)

## 2016-05-13 ENCOUNTER — Other Ambulatory Visit: Payer: Self-pay | Admitting: Family

## 2016-05-13 MED ORDER — ATORVASTATIN CALCIUM 20 MG PO TABS: 20.0000 mg | ORAL_TABLET | Freq: Every day | ORAL | 1 refills | Status: DC

## 2016-05-28 ENCOUNTER — Ambulatory Visit (INDEPENDENT_AMBULATORY_CARE_PROVIDER_SITE_OTHER): Payer: Medicare HMO | Admitting: Family

## 2016-05-28 ENCOUNTER — Encounter: Payer: Self-pay | Admitting: Family

## 2016-05-28 VITALS — BP 173/90 | HR 91 | Temp 97.0°F | Ht 62.0 in | Wt 172.8 lb

## 2016-05-28 DIAGNOSIS — R300 Dysuria: Secondary | ICD-10-CM | POA: Diagnosis not present

## 2016-05-28 DIAGNOSIS — N3001 Acute cystitis with hematuria: Secondary | ICD-10-CM | POA: Diagnosis not present

## 2016-05-28 LAB — MICROSCOPIC EXAMINATION

## 2016-05-28 LAB — URINALYSIS, COMPLETE
BILIRUBIN UA: NEGATIVE
GLUCOSE, UA: NEGATIVE
KETONES UA: NEGATIVE
NITRITE UA: NEGATIVE
SPEC GRAV UA: 1.01 (ref 1.005–1.030)
UUROB: 0.2 mg/dL (ref 0.2–1.0)
pH, UA: 7 (ref 5.0–7.5)

## 2016-05-28 MED ORDER — PRAVASTATIN SODIUM 20 MG PO TABS: 20.0000 mg | ORAL_TABLET | Freq: Every day | ORAL | 3 refills | Status: DC

## 2016-05-28 MED ORDER — CIPROFLOXACIN HCL 500 MG PO TABS: 500.0000 mg | ORAL_TABLET | Freq: Two times a day (BID) | ORAL | 0 refills | Status: DC

## 2016-05-28 NOTE — Patient Instructions (Signed)
Pyelonephritis, Adult Pyelonephritis is a kidney infection. The kidneys are the organs that filter a person's blood and move waste out of the bloodstream and into the urine. Urine passes from the kidneys, through the ureters, and into the bladder. There are two main types of pyelonephritis:  Infections that come on quickly without any warning (acute pyelonephritis).  Infections that last for a long period of time (chronic pyelonephritis). In most cases, the infection clears up with treatment and does not cause further problems. More severe infections or chronic infections can sometimes spread to the bloodstream or lead to other problems with the kidneys. What are the causes? This condition is usually caused by:  Bacteria traveling from the bladder to the kidney through infected urine. The urine in the bladder can become infected with bacteria from:  Bladder infection (cystitis).  Inflammation of the prostate gland (prostatitis).  Sexual intercourse, in females.  Bacteria traveling from the bloodstream to the kidney. What increases the risk? This condition is more likely to develop in:  Pregnant women.  Older people.  People who have diabetes.  People who have kidney stones or bladder stones.  People who have other abnormalities of the kidney or ureter.  People who have a catheter placed in the bladder.  People who have cancer.  People who are sexually active.  Women who use spermicides.  People who have had a prior urinary tract infection. What are the signs or symptoms? Symptoms of this condition include:  Frequent urination.  Strong or persistent urge to urinate.  Burning or stinging when urinating.  Abdominal pain.  Back pain.  Pain in the side or flank area.  Fever.  Chills.  Blood in the urine, or dark urine.  Nausea.  Vomiting. How is this diagnosed? This condition may be diagnosed based on:  Medical history and physical exam.  Urine  tests.  Blood tests. You may also have imaging tests of the kidneys, such as an ultrasound or CT scan. How is this treated? Treatment for this condition may depend on the severity of the infection.  If the infection is mild and is found early, you may be treated with antibiotic medicines taken by mouth. You will need to drink fluids to remain hydrated.  If the infection is more severe, you may need to stay in the hospital and receive antibiotics given directly into a vein through an IV tube. You may also need to receive fluids through an IV tube if you are not able to remain hydrated. After your hospital stay, you may need to take oral antibiotics for a period of time. Other treatments may be required, depending on the cause of the infection. Follow these instructions at home: Medicines   Take over-the-counter and prescription medicines only as told by your health care provider.  If you were prescribed an antibiotic medicine, take it as told by your health care provider. Do not stop taking the antibiotic even if you start to feel better. General instructions   Drink enough fluid to keep your urine clear or pale yellow.  Avoid caffeine, tea, and carbonated beverages. They tend to irritate the bladder.  Urinate often. Avoid holding in urine for long periods of time.  Urinate before and after sex.  After a bowel movement, women should cleanse from front to back. Use each tissue only once.  Keep all follow-up visits as told by your health care provider. This is important. Contact a health care provider if:  Your symptoms do not get better  after 2 days of treatment.  Your symptoms get worse.  You have a fever. Get help right away if:  You are unable to take your antibiotics or fluids.  You have shaking chills.  You vomit.  You have severe flank or back pain.  You have extreme weakness or fainting. This information is not intended to replace advice given to you by your  health care provider. Make sure you discuss any questions you have with your health care provider. Document Released: 01/21/2005 Document Revised: 06/29/2015 Document Reviewed: 05/16/2014 Elsevier Interactive Patient Education  2017 [employer].

## 2016-05-28 NOTE — Progress Notes (Signed)
   Subjective:    Patient ID: Christina Gonzales, female    DOB: 1945/03/01, 71 y.o.   MRN: [id number]  Dysuria   The current episode started in the past 7 days. The problem occurs every urination. The problem has been gradually worsening. The quality of the pain is described as aching and burning. The pain is at a severity of 10/10. The pain is moderate. Associated symptoms include frequency, hematuria, hesitancy and urgency. Pertinent negatives include no nausea or vomiting. She has tried increased fluids for the symptoms. The treatment provided mild relief.      Review of Systems  Gastrointestinal: Negative for nausea and vomiting.  Genitourinary: Positive for dysuria, frequency, hematuria, hesitancy and urgency.  All other systems reviewed and are negative.      Objective:   Physical Exam  Constitutional: She is oriented to person, place, and time. She appears well-developed and well-nourished. No distress.  HENT:  Head: Normocephalic.  Eyes: Pupils are equal, round, and reactive to light.  Neck: Normal range of motion. Neck supple. No thyromegaly present.  Cardiovascular: Normal rate, regular rhythm, normal heart sounds and intact distal pulses.   No murmur heard. Pulmonary/Chest: Effort normal and breath sounds normal. No respiratory distress. She has no wheezes.  Abdominal: Soft. Bowel sounds are normal. She exhibits no distension. There is tenderness (mild lower abd ).  Musculoskeletal: Normal range of motion. She exhibits no edema or tenderness.  Neurological: She is alert and oriented to person, place, and time.  Skin: Skin is warm and dry.  Psychiatric: She has a normal mood and affect. Her behavior is normal. Judgment and thought content normal.  Vitals reviewed.     BP (!) 191/88   Pulse 88   Temp 97 F (36.1 C) (Oral)   Ht 5\' 2"  (1.575 m)   Wt 172 lb 12.8 oz (78.4 kg)   BMI 31.61 kg/m      Assessment & Plan:  1. Dysuria - Urinalysis, Complete  2.  Acute cystitis with hematuria Force fluids AZO over the counter X2 days RTO prn Culture pending - ciprofloxacin (CIPRO) 500 MG tablet; Take 1 tablet (500 mg total) by mouth 2 (two) times daily.  Dispense: 20 tablet; Refill: 0 - Urine culture   [name], FNP

## 2016-05-30 ENCOUNTER — Other Ambulatory Visit: Payer: Self-pay | Admitting: Family

## 2016-05-30 LAB — URINE CULTURE

## 2016-05-30 MED ORDER — SULFAMETHOXAZOLE-TRIMETHOPRIM 800-160 MG PO TABS: 1.0000 | ORAL_TABLET | Freq: Two times a day (BID) | ORAL | 0 refills | Status: DC

## 2016-10-03 ENCOUNTER — Ambulatory Visit (INDEPENDENT_AMBULATORY_CARE_PROVIDER_SITE_OTHER): Payer: Medicare HMO | Admitting: Family

## 2016-10-03 ENCOUNTER — Encounter: Payer: Self-pay | Admitting: Family

## 2016-10-03 VITALS — BP 135/70 | HR 78 | Temp 98.1°F | Ht 62.0 in | Wt 182.0 lb

## 2016-10-03 DIAGNOSIS — E785 Hyperlipidemia, unspecified: Secondary | ICD-10-CM | POA: Diagnosis not present

## 2016-10-03 DIAGNOSIS — R69 Illness, unspecified: Secondary | ICD-10-CM | POA: Diagnosis not present

## 2016-10-03 DIAGNOSIS — R569 Unspecified convulsions: Secondary | ICD-10-CM | POA: Diagnosis not present

## 2016-10-03 DIAGNOSIS — E669 Obesity, unspecified: Secondary | ICD-10-CM

## 2016-10-03 DIAGNOSIS — L821 Other seborrheic keratosis: Secondary | ICD-10-CM | POA: Diagnosis not present

## 2016-10-03 DIAGNOSIS — F411 Generalized anxiety disorder: Secondary | ICD-10-CM | POA: Diagnosis not present

## 2016-10-03 DIAGNOSIS — I10 Essential (primary) hypertension: Secondary | ICD-10-CM | POA: Diagnosis not present

## 2016-10-03 DIAGNOSIS — B351 Tinea unguium: Secondary | ICD-10-CM

## 2016-10-03 NOTE — Patient Instructions (Addendum)
Onychomycosis/Fungal Toenails  WHAT IS IT? An infection that lies within the keratin of your nail plate that is caused by a fungus.  WHY ME? Fungal infections affect all ages, sexes, races, and creeds.  There may be many factors that predispose you to a fungal infection such as age, coexisting medical conditions such as diabetes, or an autoimmune disease; stress, medications, fatigue, genetics, etc.  Bottom line: fungus thrives in a warm, moist environment and your shoes offer such a location.  IS IT CONTAGIOUS? Theoretically, yes.  You do not want to share shoes, nail clippers or files with someone who has fungal toenails.  Walking around barefoot in the same room or sleeping in the same bed is unlikely to transfer the organism.  It is important to realize, however, that fungus can spread easily from one nail to the next on the same foot.  HOW DO WE TREAT THIS?  There are several ways to treat this condition.  Treatment may depend on many factors such as age, medications, pregnancy, liver and kidney conditions, etc.  It is best to ask your doctor which options are available to you.  1. No treatment.   Unlike many other medical concerns, you can live with this condition.  However for many people this can be a painful condition and may lead to ingrown toenails or a bacterial infection.  It is recommended that you keep the nails cut short to help reduce the amount of fungal nail. 2. Topical treatment.  These range from herbal remedies to prescription strength nail lacquers.  About 40-50% effective, topicals require twice daily application for approximately 9 to 12 months or until an entirely new nail has grown out.  The most effective topicals are medical grade medications available through physicians offices. 3. Oral antifungal medications.  With an 80-90% cure rate, the most common oral medication requires 3 to 4 months of therapy and stays in your system for a year as the new nail grows out.  Oral  antifungal medications do require blood work to make sure it is a safe drug for you.  A liver function panel will be performed prior to starting the medication and after the first month of treatment.  It is important to have the blood work performed to avoid any harmful side effects.  In general, this medication safe but blood work is required. 4. Laser Therapy.  This treatment is performed by applying a specialized laser to the affected nail plate.  This therapy is noninvasive, fast, and non-painful.  It is not covered by insurance and is therefore, out of pocket.  The results have been very good with a 80-95% cure rate.  [facility] is the only practice in the area to offer this therapy. 5. Permanent Nail Avulsion.  Removing the entire nail so that a new nail will not grow back.   Seborrheic Keratosis Seborrheic keratosis is a common, noncancerous (benign) skin growth. This condition causes waxy, rough, tan, brown, or black spots to appear on the skin. These skin growths can be flat or raised. What are the causes? The cause of this condition is not known. What increases the risk? This condition is more likely to develop in:  People who have a family history of seborrheic keratosis.  People who are 32 or older.  People who are pregnant.  People who have had estrogen replacement therapy.  What are the signs or symptoms? This condition often occurs on the face, chest, shoulders, back, or other areas.  These growths:  Are usually painless, but may become irritated and itchy.  Can be yellow, brown, black, or other colors.  Are slightly raised or have a flat surface.  Are sometimes rough or wart-like in texture.  Are often waxy on the surface.  Are round or oval-shaped.  Sometimes look like they are "stuck on."  Often occur in groups, but may occur as a single growth.  How is this diagnosed? This condition is diagnosed with a medical history and physical exam. A sample of  the growth may be tested (skin biopsy). You may need to see a skin specialist (dermatologist). How is this treated? Treatment is not usually needed for this condition, unless the growths are irritated or are often bleeding. You may also choose to have the growths removed if you do not like their appearance. Most commonly, these growths are treated with a procedure in which liquid nitrogen is applied to "freeze" off the growth (cryosurgery). They may also be burned off with electricity or cut off. Follow these instructions at home:  Watch your growth for any changes.  Keep all follow-up visits as told by your health care provider. This is important.  Do not scratch or pick at the growth or growths. This can cause them to become irritated or infected. Contact a health care provider if:  You suddenly have many new growths.  Your growth bleeds, itches, or hurts.  Your growth suddenly becomes larger or changes color. This information is not intended to replace advice given to you by your health care provider. Make sure you discuss any questions you have with your health care provider. Document Released: 02/23/2010 Document Revised: 06/29/2015 Document Reviewed: 06/08/2014 Elsevier Interactive Patient Education  2017 [employer].

## 2016-10-03 NOTE — Progress Notes (Signed)
Subjective:    Patient ID: Christina Gonzales, female    DOB: October 22, 1945, 71 y.o.   MRN: [id number]  Pt presents to the office today for chronic follow up. PT has not seen Neurologists in over a year and states she has not had a seizure in over a year and is doing well.    Pt complaining of toenail thickness and pain.  Hypertension  This is a chronic problem. The current episode started more than 1 year ago. The problem has been resolved since onset. The problem is controlled. Associated symptoms include anxiety and malaise/fatigue. Pertinent negatives include no peripheral edema or shortness of breath. Risk factors for coronary artery disease include dyslipidemia, obesity, post-menopausal state and sedentary lifestyle. The current treatment provides moderate improvement. There is no history of kidney disease, CAD/MI, CVA or heart failure.  Hyperlipidemia  This is a chronic problem. The current episode started more than 1 year ago. The problem is uncontrolled. Recent lipid tests were reviewed and are high. Exacerbating diseases include obesity. Pertinent negatives include no shortness of breath. Current antihyperlipidemic treatment includes diet change. The current treatment provides no improvement of lipids. Risk factors for coronary artery disease include dyslipidemia, obesity, post-menopausal, a sedentary lifestyle and family history.  Anxiety  Presents for follow-up visit. Symptoms include excessive worry and nervous/anxious behavior. Patient reports no decreased concentration, irritability or shortness of breath. Symptoms occur most days.    Rash  This is a new problem. The current episode started in the past 7 days. The problem is unchanged. The affected locations include the back. The rash is characterized by itchiness. She was exposed to a new detergent/soap. Pertinent negatives include no cough, diarrhea, joint pain, shortness of breath or vomiting. Past treatments include nothing.       Review of Systems  Constitutional: Positive for malaise/fatigue. Negative for irritability.  Respiratory: Negative for cough and shortness of breath.   Gastrointestinal: Negative for diarrhea and vomiting.  Musculoskeletal: Negative for joint pain.  Skin: Positive for rash.       Toenail changes  Psychiatric/Behavioral: Negative for decreased concentration. The patient is nervous/anxious.   All other systems reviewed and are negative.      Objective:   Physical Exam  Constitutional: She is oriented to person, place, and time. She appears well-developed and well-nourished. No distress.  HENT:  Head: Normocephalic and atraumatic.  Right Ear: External ear normal.  Left Ear: External ear normal.  Nose: Nose normal.  Mouth/Throat: Oropharynx is clear and moist.  Eyes: Pupils are equal, round, and reactive to light.  Neck: Normal range of motion. Neck supple. No thyromegaly present.  Cardiovascular: Normal rate, regular rhythm, normal heart sounds and intact distal pulses.   No murmur heard. Pulmonary/Chest: Effort normal and breath sounds normal. No respiratory distress. She has no wheezes.  Abdominal: Soft. Bowel sounds are normal. She exhibits no distension. There is no tenderness.  Musculoskeletal: Normal range of motion. She exhibits no edema or tenderness.  Neurological: She is alert and oriented to person, place, and time.  Skin: Skin is warm and dry.  Bilateral Great toenail thickness and discoloration, multiple seborrheic keratosis on back  Psychiatric: She has a normal mood and affect. Her behavior is normal. Judgment and thought content normal.  Vitals reviewed.     BP 135/70   Pulse 78   Temp 98.1 F (36.7 C) (Oral)   Ht '5\' 2"'  (1.575 m)   Wt 182 lb (82.6 kg)   BMI 33.29 kg/m  Assessment & Plan:  1. Hyperlipidemia, unspecified hyperlipidemia type - CMP14+EGFR - Lipid panel  2. GAD (generalized anxiety disorder) - CMP14+EGFR  3. Essential  hypertension, benign - CMP14+EGFR  4. Seizures (HCC) - CMP14+EGFR  5. Obesity (BMI 30-39.9)  - CMP14+EGFR  6. Onychomycosis Will check liver function today before ordering terbinafine - CMP14+EGFR  7. Seborrheic keratoses Do not pick at   Continue all meds Labs pending Health Maintenance reviewed Diet and exercise encouraged RTO 4 months  [name], FNP

## 2016-10-04 LAB — CMP14+EGFR
ALK PHOS: 31 IU/L — AB (ref 39–117)
ALT: 52 IU/L — ABNORMAL HIGH (ref 0–32)
AST: 35 IU/L (ref 0–40)
Albumin/Globulin Ratio: 1.6 (ref 1.2–2.2)
Albumin: 4.1 g/dL (ref 3.5–4.8)
BUN/Creatinine Ratio: 15 (ref 12–28)
BUN: 12 mg/dL (ref 8–27)
Bilirubin Total: 0.2 mg/dL (ref 0.0–1.2)
CO2: 26 mmol/L (ref 20–29)
CREATININE: 0.82 mg/dL (ref 0.57–1.00)
Calcium: 9.3 mg/dL (ref 8.7–10.3)
Chloride: 104 mmol/L (ref 96–106)
GFR calc Af Amer: 83 mL/min/{1.73_m2} (ref >59)
GFR calc non Af Amer: 72 mL/min/{1.73_m2} (ref >59)
GLOBULIN, TOTAL: 2.6 g/dL (ref 1.5–4.5)
GLUCOSE: 110 mg/dL — AB (ref 65–99)
Potassium: 5 mmol/L (ref 3.5–5.2)
SODIUM: 142 mmol/L (ref 134–144)
Total Protein: 6.7 g/dL (ref 6.0–8.5)

## 2016-10-04 LAB — LIPID PANEL
CHOL/HDL RATIO: 3.9 ratio (ref 0.0–4.4)
Cholesterol, Total: 243 mg/dL — ABNORMAL HIGH (ref 100–199)
HDL: 63 mg/dL (ref >39)
LDL CALC: 148 mg/dL — AB (ref 0–99)
TRIGLYCERIDES: 161 mg/dL — AB (ref 0–149)
VLDL Cholesterol Cal: 32 mg/dL (ref 5–40)

## 2016-10-24 DIAGNOSIS — L03031 Cellulitis of right toe: Secondary | ICD-10-CM | POA: Diagnosis not present

## 2016-10-24 DIAGNOSIS — M79674 Pain in right toe(s): Secondary | ICD-10-CM | POA: Diagnosis not present

## 2016-10-29 ENCOUNTER — Ambulatory Visit (INDEPENDENT_AMBULATORY_CARE_PROVIDER_SITE_OTHER): Payer: Medicare HMO | Admitting: Family Medicine

## 2016-10-29 ENCOUNTER — Encounter: Payer: Self-pay | Admitting: Family Medicine

## 2016-10-29 VITALS — BP 154/65 | HR 86 | Temp 96.8°F | Ht 62.0 in | Wt 183.0 lb

## 2016-10-29 DIAGNOSIS — R399 Unspecified symptoms and signs involving the genitourinary system: Secondary | ICD-10-CM

## 2016-10-29 LAB — URINALYSIS
BILIRUBIN UA: NEGATIVE
Glucose, UA: NEGATIVE
Ketones, UA: NEGATIVE
Nitrite, UA: POSITIVE — AB
PH UA: 5 (ref 5.0–7.5)
Specific Gravity, UA: 1.015 (ref 1.005–1.030)
Urobilinogen, Ur: 1 mg/dL (ref 0.2–1.0)

## 2016-10-29 MED ORDER — SULFAMETHOXAZOLE-TRIMETHOPRIM 800-160 MG PO TABS: 1.0000 | ORAL_TABLET | Freq: Two times a day (BID) | ORAL | 0 refills | Status: DC

## 2016-10-29 NOTE — Progress Notes (Signed)
Chief Complaint  Patient presents with  . Dysuria    HPI  Patient presents today for burning with urination and frequency for several days. Denies fever . No flank pain. No nausea, vomiting.   PMH: Smoking status noted ROS: Per HPI  Objective: BP (!) 154/65   Pulse 86   Temp (!) 96.8 F (36 C) (Oral)   Ht 5\' 2"  (1.575 m)   Wt 183 lb (83 kg)   BMI 33.47 kg/m  Gen: NAD, alert, cooperative with exam HEENT: NCAT, EOMI, PERRL  Abd: SNTND, BS present, no guarding or organomegaly Ext: No edema, warm Neuro: Alert and oriented, No gross deficits  Assessment and plan:  1. UTI symptoms     Meds ordered this encounter  Medications  . sulfamethoxazole-trimethoprim (BACTRIM DS) 800-160 MG tablet    Sig: Take 1 tablet by mouth 2 (two) times daily.    Dispense:  14 tablet    Refill:  0    Orders Placed This Encounter  Procedures  . Urine Culture  . Urinalysis    Follow up as needed.  Claretta Fraise, MD

## 2016-11-03 LAB — URINE CULTURE

## 2016-11-06 ENCOUNTER — Other Ambulatory Visit: Payer: Medicare HMO

## 2016-11-06 DIAGNOSIS — N390 Urinary tract infection, site not specified: Secondary | ICD-10-CM

## 2016-11-06 LAB — URINALYSIS
BILIRUBIN UA: NEGATIVE
GLUCOSE, UA: NEGATIVE
KETONES UA: NEGATIVE
LEUKOCYTES UA: NEGATIVE
Nitrite, UA: NEGATIVE
PH UA: 6 (ref 5.0–7.5)
PROTEIN UA: NEGATIVE
RBC UA: NEGATIVE
Urobilinogen, Ur: 0.2 mg/dL (ref 0.2–1.0)

## 2016-11-07 LAB — URINE CULTURE

## 2016-11-27 DIAGNOSIS — Z23 Encounter for immunization: Secondary | ICD-10-CM | POA: Diagnosis not present

## 2017-01-16 ENCOUNTER — Telehealth: Payer: Self-pay | Admitting: Family

## 2017-01-16 NOTE — Telephone Encounter (Signed)
Patient was not interested in an appointment for tomorrow for her infection because she would be working.  Advised to call in AM for same day appointment or after hours.

## 2017-02-26 ENCOUNTER — Ambulatory Visit (INDEPENDENT_AMBULATORY_CARE_PROVIDER_SITE_OTHER): Payer: Medicare HMO | Admitting: Physician Assistant

## 2017-02-26 ENCOUNTER — Encounter: Payer: Self-pay | Admitting: Physician Assistant

## 2017-02-26 VITALS — BP 135/63 | HR 79 | Temp 98.5°F | Ht 62.0 in | Wt 185.4 lb

## 2017-02-26 DIAGNOSIS — R252 Cramp and spasm: Secondary | ICD-10-CM

## 2017-02-26 DIAGNOSIS — L821 Other seborrheic keratosis: Secondary | ICD-10-CM | POA: Diagnosis not present

## 2017-02-26 DIAGNOSIS — Z0000 Encounter for general adult medical examination without abnormal findings: Secondary | ICD-10-CM

## 2017-02-26 DIAGNOSIS — I10 Essential (primary) hypertension: Secondary | ICD-10-CM | POA: Diagnosis not present

## 2017-02-26 DIAGNOSIS — E785 Hyperlipidemia, unspecified: Secondary | ICD-10-CM | POA: Diagnosis not present

## 2017-02-26 NOTE — Patient Instructions (Signed)
Seborrheic Keratosis Seborrheic keratosis is a common, noncancerous (benign) skin growth. This condition causes waxy, rough, tan, brown, or black spots to appear on the skin. These skin growths can be flat or raised. What are the causes? The cause of this condition is not known. What increases the risk? This condition is more likely to develop in:  People who have a family history of seborrheic keratosis.  People who are 45 or older.  People who are pregnant.  People who have had estrogen replacement therapy.  What are the signs or symptoms? This condition often occurs on the face, chest, shoulders, back, or other areas. These growths:  Are usually painless, but may become irritated and itchy.  Can be yellow, brown, black, or other colors.  Are slightly raised or have a flat surface.  Are sometimes rough or wart-like in texture.  Are often waxy on the surface.  Are round or oval-shaped.  Sometimes look like they are "stuck on."  Often occur in groups, but may occur as a single growth.  How is this diagnosed? This condition is diagnosed with a medical history and physical exam. A sample of the growth may be tested (skin biopsy). You may need to see a skin specialist (dermatologist). How is this treated? Treatment is not usually needed for this condition, unless the growths are irritated or are often bleeding. You may also choose to have the growths removed if you do not like their appearance. Most commonly, these growths are treated with a procedure in which liquid nitrogen is applied to "freeze" off the growth (cryosurgery). They may also be burned off with electricity or cut off. Follow these instructions at home:  Watch your growth for any changes.  Keep all follow-up visits as told by your health care provider. This is important.  Do not scratch or pick at the growth or growths. This can cause them to become irritated or infected. Contact a health care provider  if:  You suddenly have many new growths.  Your growth bleeds, itches, or hurts.  Your growth suddenly becomes larger or changes color. This information is not intended to replace advice given to you by your health care provider. Make sure you discuss any questions you have with your health care provider. Document Released: 02/23/2010 Document Revised: 06/29/2015 Document Reviewed: 06/08/2014 Elsevier Interactive Patient Education  2018 [place]. Leg Cramps Leg cramps occur when a muscle or muscles tighten and you have no control over this tightening (involuntary muscle contraction). Muscle cramps can develop in any muscle, but the most common place is in the calf muscles of the leg. Those cramps can occur during exercise or when you are at rest. Leg cramps are painful, and they may last for a few seconds to a few minutes. Cramps may return several times before they finally stop. Usually, leg cramps are not caused by a serious medical problem. In many cases, the cause is not known. Some common causes include:  Overexertion.  Overuse from repetitive motions, or doing the same thing over and over.  Remaining in a certain position for a long period of time.  Improper preparation, form, or technique while performing a sport or an activity.  Dehydration.  Injury.  Side effects of some medicines.  Abnormally low levels of the salts and ions in your blood (electrolytes), especially potassium and calcium. These levels could be low if you are taking water pills (diuretics) or if you are pregnant.  Follow these instructions at home: Watch your condition  for any changes. Taking the following actions may help to lessen any discomfort that you are feeling:  Stay well-hydrated. Drink enough fluid to keep your urine clear or pale yellow.  Try massaging, stretching, and relaxing the affected muscle. Do this for several minutes at a time.  For tight or tense muscles, use a warm towel, heating  pad, or hot shower water directed to the affected area.  If you are sore or have pain after a cramp, applying ice to the affected area may relieve discomfort. ? Put ice in a plastic bag. ? Place a towel between your skin and the bag. ? Leave the ice on for 20 minutes, 2-3 times per day.  Avoid strenuous exercise for several days if you have been having frequent leg cramps.  Make sure that your diet includes the essential minerals for your muscles to work normally.  Take medicines only as directed by your health care provider.  Contact a health care provider if:  Your leg cramps get more severe or more frequent, or they do not improve over time.  Your foot becomes cold, numb, or blue. This information is not intended to replace advice given to you by your health care provider. Make sure you discuss any questions you have with your health care provider. Document Released: 02/29/2004 Document Revised: 06/29/2015 Document Reviewed: 12/29/2013 Elsevier Interactive Patient Education  [employer].

## 2017-02-27 ENCOUNTER — Other Ambulatory Visit: Payer: Medicare HMO

## 2017-02-27 DIAGNOSIS — R252 Cramp and spasm: Secondary | ICD-10-CM | POA: Diagnosis not present

## 2017-02-27 DIAGNOSIS — E785 Hyperlipidemia, unspecified: Secondary | ICD-10-CM | POA: Diagnosis not present

## 2017-02-27 DIAGNOSIS — Z0000 Encounter for general adult medical examination without abnormal findings: Secondary | ICD-10-CM | POA: Diagnosis not present

## 2017-02-28 ENCOUNTER — Other Ambulatory Visit: Payer: Self-pay | Admitting: Family

## 2017-02-28 DIAGNOSIS — R748 Abnormal levels of other serum enzymes: Secondary | ICD-10-CM

## 2017-02-28 LAB — CBC WITH DIFFERENTIAL/PLATELET
BASOS: 1 %
Basophils Absolute: 0 10*3/uL (ref 0.0–0.2)
EOS (ABSOLUTE): 0.2 10*3/uL (ref 0.0–0.4)
EOS: 2 %
HEMOGLOBIN: 13.2 g/dL (ref 11.1–15.9)
Hematocrit: 38.9 % (ref 34.0–46.6)
IMMATURE GRANULOCYTES: 0 %
Immature Grans (Abs): 0 10*3/uL (ref 0.0–0.1)
LYMPHS: 34 %
Lymphocytes Absolute: 2.2 10*3/uL (ref 0.7–3.1)
MCH: 29.9 pg (ref 26.6–33.0)
MCHC: 33.9 g/dL (ref 31.5–35.7)
MCV: 88 fL (ref 79–97)
MONOCYTES: 7 %
Monocytes Absolute: 0.4 10*3/uL (ref 0.1–0.9)
NEUTROS PCT: 56 %
Neutrophils Absolute: 3.6 10*3/uL (ref 1.4–7.0)
PLATELETS: 200 10*3/uL (ref 150–379)
RBC: 4.41 x10E6/uL (ref 3.77–5.28)
RDW: 13.5 % (ref 12.3–15.4)
WBC: 6.3 10*3/uL (ref 3.4–10.8)

## 2017-02-28 LAB — TSH: TSH: 2.6 u[IU]/mL (ref 0.450–4.500)

## 2017-02-28 LAB — LIPID PANEL
CHOL/HDL RATIO: 3.6 ratio (ref 0.0–4.4)
Cholesterol, Total: 231 mg/dL — ABNORMAL HIGH (ref 100–199)
HDL: 65 mg/dL (ref >39)
LDL Calculated: 145 mg/dL — ABNORMAL HIGH (ref 0–99)
Triglycerides: 103 mg/dL (ref 0–149)
VLDL Cholesterol Cal: 21 mg/dL (ref 5–40)

## 2017-02-28 LAB — CMP14+EGFR
ALT: 69 IU/L — AB (ref 0–32)
AST: 48 IU/L — AB (ref 0–40)
Albumin/Globulin Ratio: 1.6 (ref 1.2–2.2)
Albumin: 4.2 g/dL (ref 3.5–4.8)
Alkaline Phosphatase: 30 IU/L — ABNORMAL LOW (ref 39–117)
BUN/Creatinine Ratio: 23 (ref 12–28)
BUN: 17 mg/dL (ref 8–27)
Bilirubin Total: 0.4 mg/dL (ref 0.0–1.2)
CALCIUM: 9.4 mg/dL (ref 8.7–10.3)
CO2: 24 mmol/L (ref 20–29)
CREATININE: 0.74 mg/dL (ref 0.57–1.00)
Chloride: 104 mmol/L (ref 96–106)
GFR, EST AFRICAN AMERICAN: 94 mL/min/{1.73_m2} (ref >59)
GFR, EST NON AFRICAN AMERICAN: 81 mL/min/{1.73_m2} (ref >59)
Globulin, Total: 2.6 g/dL (ref 1.5–4.5)
Glucose: 111 mg/dL — ABNORMAL HIGH (ref 65–99)
POTASSIUM: 5.1 mmol/L (ref 3.5–5.2)
Sodium: 143 mmol/L (ref 134–144)
TOTAL PROTEIN: 6.8 g/dL (ref 6.0–8.5)

## 2017-02-28 LAB — MAGNESIUM: MAGNESIUM: 2.2 mg/dL (ref 1.6–2.3)

## 2017-02-28 MED ORDER — ATORVASTATIN CALCIUM 20 MG PO TABS: 20.0000 mg | ORAL_TABLET | Freq: Every day | ORAL | 11 refills | Status: DC

## 2017-02-28 NOTE — Progress Notes (Signed)
BP 135/63   Pulse 79   Temp 98.5 F (36.9 C) (Oral)   Ht '5\' 2"'  (1.575 m)   Wt 185 lb 6.4 oz (84.1 kg)   BMI 33.91 kg/m    Subjective:    Patient ID: Christina Gonzales, female    DOB: 1945/08/25, 72 y.o.   MRN: [id number]  HPI: Christina Gonzales is a 72 y.o. female presenting on 02/26/2017 for leg and feet cramping  Patient comes in complaining of cramps in her feet and legs.  They have gone on for a long time.  She states she has tried mustard and potassium tablets with no relief she is also tried vitamin D, magnesium, iron.  She states that they have happened in her calves and feet.  It will be worse after she works.  She still has to work at this time.  She reports that she would also like to have her well labs performed.  We also discussed her hypertension and seborrheic keratoses that are on her back.  Relevant past medical, surgical, family and social history reviewed and updated as indicated. Allergies and medications reviewed and updated.  Past Medical History:  Diagnosis Date  . Anxiety   . Hyperlipidemia   . Seizures ([place])     Past Surgical History:  Procedure Laterality Date  . TUMOR EXCISION     Behind left eye    Review of Systems  Constitutional: Negative.  Negative for activity change, fatigue and fever.  HENT: Negative.   Eyes: Negative.   Respiratory: Negative.  Negative for cough.   Cardiovascular: Negative.  Negative for chest pain.  Gastrointestinal: Negative.  Negative for abdominal pain.  Endocrine: Negative.   Genitourinary: Negative.  Negative for dysuria.  Musculoskeletal: Negative.   Skin: Negative.   Neurological: Negative.     Allergies as of 02/26/2017      Reactions   Heparin Nausea And Vomiting   Warfarin Other (See Comments)   unknown   Penicillins Rash      Medication List        Accurate as of 02/26/17 11:59 PM. Always use your most recent med list.          cholecalciferol 1000 units tablet Commonly known as:   VITAMIN D Take 1,000 Units by mouth daily. Reported on 05/26/2015   lisinopril 20 MG tablet Commonly known as:  PRINIVIL,ZESTRIL Take 1 tablet (20 mg total) by mouth daily.   Multiple Vitamins tablet Take 1 tablet by mouth. Reported on 05/26/2015   Omega-3 1000 MG Caps Take 1 g by mouth. Reported on 05/26/2015          Objective:    BP 135/63   Pulse 79   Temp 98.5 F (36.9 C) (Oral)   Ht '5\' 2"'  (1.575 m)   Wt 185 lb 6.4 oz (84.1 kg)   BMI 33.91 kg/m   Allergies  Allergen Reactions  . Heparin Nausea And Vomiting  . Warfarin Other (See Comments)    unknown  . Penicillins Rash    Physical Exam  Constitutional: She is oriented to person, place, and time. She appears well-developed and well-nourished.  HENT:  Head: Normocephalic and atraumatic.  Right Ear: Tympanic membrane, external ear and ear canal normal.  Left Ear: Tympanic membrane, external ear and ear canal normal.  Nose: Nose normal. No rhinorrhea.  Mouth/Throat: Oropharynx is clear and moist and mucous membranes are normal. No oropharyngeal exudate or posterior oropharyngeal erythema.  Eyes: Conjunctivae and EOM are normal.  Pupils are equal, round, and reactive to light.  Neck: Normal range of motion. Neck supple.  Cardiovascular: Normal rate, regular rhythm, normal heart sounds and intact distal pulses.  Pulmonary/Chest: Effort normal and breath sounds normal.  Abdominal: Soft. Bowel sounds are normal.  Neurological: She is alert and oriented to person, place, and time. She has normal reflexes.  Skin: Skin is warm and dry. No rash noted.  Psychiatric: She has a normal mood and affect. Her behavior is normal. Judgment and thought content normal.  Nursing note and vitals reviewed.   Results for orders placed or performed in visit on 11/06/16  Urine Culture  Result Value Ref Range   Urine Culture, Routine Final report    Organism ID, Bacteria Comment   Urinalysis  Result Value Ref Range   Specific Gravity,  UA <1.005 (L) 1.005 - 1.030   pH, UA 6.0 5.0 - 7.5   Color, UA Yellow Yellow   Appearance Ur Clear Clear   Leukocytes, UA Negative Negative   Protein, UA Negative Negative/Trace   Glucose, UA Negative Negative   Ketones, UA Negative Negative   RBC, UA Negative Negative   Bilirubin, UA Negative Negative   Urobilinogen, Ur 0.2 0.2 - 1.0 mg/dL   Nitrite, UA Negative Negative      Assessment & Plan:   1. Hyperlipidemia, unspecified hyperlipidemia type - Lipid panel - Lipid panel; Future  2. Essential hypertension, benign - Lipid panel  3. Leg cramps - Lipid panel - CBC with Differential/Platelet; Future - CMP14+EGFR; Future - Magnesium; Future - TSH; Future  4. Well adult exam - Lipid panel - VITAMIN D 25 Hydroxy (Vit-D Deficiency, Fractures) - CBC with Differential/Platelet; Future - CMP14+EGFR; Future - Lipid panel; Future - Magnesium; Future - TSH; Future  5. Seborrheic keratosis    Current Outpatient Medications:  .  lisinopril (PRINIVIL,ZESTRIL) 20 MG tablet, Take 1 tablet (20 mg total) by mouth daily., Disp: 90 tablet, Rfl: 3 .  Omega-3 1000 MG CAPS, Take 1 g by mouth. Reported on 05/26/2015, Disp: , Rfl:  .  cholecalciferol (VITAMIN D) 1000 UNITS tablet, Take 1,000 Units by mouth daily. Reported on 05/26/2015, Disp: , Rfl:  .  Multiple Vitamins tablet, Take 1 tablet by mouth. Reported on 05/26/2015, Disp: , Rfl:  Continue all other maintenance medications as listed above.  Follow up plan: Return if symptoms worsen or fail to improve.  Educational handout given for [place] PA-C [facility] [address]  [place], [place] [address] [phone]   02/28/2017, 1:32 PM

## 2017-03-05 ENCOUNTER — Ambulatory Visit (HOSPITAL_COMMUNITY): Payer: Self-pay

## 2017-05-08 ENCOUNTER — Encounter: Payer: Self-pay | Admitting: Family Medicine

## 2017-05-08 ENCOUNTER — Ambulatory Visit (INDEPENDENT_AMBULATORY_CARE_PROVIDER_SITE_OTHER): Payer: Medicare HMO | Admitting: Family Medicine

## 2017-05-08 VITALS — BP 158/69 | HR 80 | Temp 97.0°F | Ht 62.0 in | Wt 181.0 lb

## 2017-05-08 DIAGNOSIS — M25562 Pain in left knee: Secondary | ICD-10-CM | POA: Diagnosis not present

## 2017-05-08 DIAGNOSIS — R300 Dysuria: Secondary | ICD-10-CM | POA: Diagnosis not present

## 2017-05-08 DIAGNOSIS — L600 Ingrowing nail: Secondary | ICD-10-CM

## 2017-05-08 DIAGNOSIS — N3000 Acute cystitis without hematuria: Secondary | ICD-10-CM

## 2017-05-08 LAB — URINALYSIS, COMPLETE
BILIRUBIN UA: NEGATIVE
Glucose, UA: NEGATIVE
KETONES UA: NEGATIVE
Leukocytes, UA: NEGATIVE
NITRITE UA: NEGATIVE
Protein, UA: NEGATIVE
RBC UA: NEGATIVE
Specific Gravity, UA: 1.01 (ref 1.005–1.030)
UUROB: 0.2 mg/dL (ref 0.2–1.0)
pH, UA: 6 (ref 5.0–7.5)

## 2017-05-08 LAB — MICROSCOPIC EXAMINATION
RBC MICROSCOPIC, UA: NONE SEEN /HPF (ref 0–2)
Renal Epithel, UA: NONE SEEN /hpf

## 2017-05-08 MED ORDER — SULFAMETHOXAZOLE-TRIMETHOPRIM 800-160 MG PO TABS: 1.0000 | ORAL_TABLET | Freq: Two times a day (BID) | ORAL | 0 refills | Status: AC

## 2017-05-08 MED ORDER — DICLOFENAC SODIUM 1 % TD GEL: 2.0000 g | Freq: Four times a day (QID) | TRANSDERMAL | 1 refills | Status: DC

## 2017-05-08 NOTE — Patient Instructions (Signed)
I think that you may have degenerative changes within the left knee, possible meniscal injury.  I have prescribed you topical Voltaren to use up to 4 times daily if needed for knee pain.  If for any reason your insurance does not cover this medication, call me and I will be happy to send in the oral version of this medicine.  Your toenail does not look infected today.  It does look to be ingrown.  I do agree that seeing Dr. Irving Shows will be a good idea.  In the meantime, perform Epsom salt soaks.  Soak your foot 3 times daily, for at least 15 minutes each time, in warm water mixed with Epsom salt.  Peel the skin near the ingrown nail back gently with each soak.  This will help the nail grow out.  You may take Tylenol for discomfort.  If you develop fevers, chills, worsening pain, worsening redness or swelling, please seek medical attention.  Remember to cut your nails straight across.  Avoid cutting them at an angle, as this will increase your risk of developing an ingrown nail.    Ingrown Toenail An ingrown toenail occurs when the corner or sides of your toenail grow into the surrounding skin. The big toe is most commonly affected, but it can happen to any of your toes. If your ingrown toenail is not treated, you will be at risk for infection. What are the causes? This condition may be caused by:  Wearing shoes that are too small or tight.  Injury or trauma, such as stubbing your toe or having your toe stepped on.  Improper cutting or care of your toenails.  Being born with (congenital) nail or foot abnormalities, such as having a nail that is too big for your toe.  What increases the risk? Risk factors for an ingrown toenail include:  Age. Your nails tend to thicken as you get older, so ingrown nails are more common in older people.  Diabetes.  Cutting your toenails incorrectly.  Blood circulation problems.  What are the signs or symptoms? Symptoms may include:  Pain, soreness, or  tenderness.  Redness.  Swelling.  Hardening of the skin surrounding the toe.  Your ingrown toenail may be infected if there is fluid, pus, or drainage. How is this diagnosed? An ingrown toenail may be diagnosed by medical history and physical exam. If your toenail is infected, your health care provider may test a sample of the drainage. How is this treated? Treatment depends on the severity of your ingrown toenail. Some ingrown toenails may be treated at home. More severe or infected ingrown toenails may require surgery to remove all or part of the nail. Infected ingrown toenails may also be treated with antibiotic medicines. Follow these instructions at home:  If you were prescribed an antibiotic medicine, finish all of it even if you start to feel better.  Soak your foot in warm soapy water for 20 minutes, 3 times per day or as directed by your health care provider.  Carefully lift the edge of the nail away from the sore skin by wedging a small piece of cotton under the corner of the nail. This may help with the pain. Be careful not to cause more injury to the area.  Wear shoes that fit well. If your ingrown toenail is causing you pain, try wearing sandals, if possible.  Trim your toenails regularly and carefully. Do not cut them in a curved shape. Cut your toenails straight across. This prevents injury  to the skin at the corners of the toenail.  Keep your feet clean and dry.  If you are having trouble walking and are given crutches by your health care provider, use them as directed.  Do not pick at your toenail or try to remove it yourself.  Take medicines only as directed by your health care provider.  Keep all follow-up visits as directed by your health care provider. This is important. Contact a health care provider if:  Your symptoms do not improve with treatment. Get help right away if:  You have red streaks that start at your foot and go up your leg.  You have a  fever.  You have increased redness, swelling, or pain.  You have fluid, blood, or pus coming from your toenail. This information is not intended to replace advice given to you by your health care provider. Make sure you discuss any questions you have with your health care provider. Document Released: 01/19/2000 Document Revised: 06/23/2015 Document Reviewed: 12/15/2013 Elsevier Interactive Patient Education  2018 [employer].   Knee Pain, Adult Many things can cause knee pain. The pain often goes away on its own with time and rest. If the pain does not go away, tests may be done to find out what is causing the pain. Follow these instructions at home: Activity  Rest your knee.  Do not do things that cause pain.  Avoid activities where both feet leave the ground at the same time (high-impact activities). Examples are running, jumping rope, and doing jumping jacks. General instructions  Take medicines only as told by your doctor.  Raise (elevate) your knee when you are resting. Make sure your knee is higher than your heart.  Sleep with a pillow under your knee.  If told, put ice on the knee: ? Put ice in a plastic bag. ? Place a towel between your skin and the bag. ? Leave the ice on for 20 minutes, 2-3 times a day.  Ask your doctor if you should wear an elastic knee support.  Lose weight if you are overweight. Being overweight can make your knee hurt more.  Do not use any tobacco products. These include cigarettes, chewing tobacco, or electronic cigarettes. If you need help quitting, ask your doctor. Smoking may slow down healing. Contact a doctor if:  The pain does not stop.  The pain changes or gets worse.  You have a fever along with knee pain.  Your knee gives out or locks up.  Your knee swells, and becomes worse. Get help right away if:  Your knee feels warm.  You cannot move your knee.  You have very bad knee pain.  You have chest pain.  You have  trouble breathing. Summary  Many things can cause knee pain. The pain often goes away on its own with time and rest.  Avoid activities that put stress on your knee. These include running and jumping rope.  Get help right away if you cannot move your knee, or if your knee feels warm, or if you have trouble breathing. This information is not intended to replace advice given to you by your health care provider. Make sure you discuss any questions you have with your health care provider. Document Released: 04/19/2008 Document Revised: 01/16/2016 Document Reviewed: 01/16/2016 Elsevier Interactive Patient Education  2017 [employer].

## 2017-05-08 NOTE — Progress Notes (Signed)
Subjective: CC: Multiple concerns PCP: Sharion Balloon, FNP Christina Gonzales is a 72 y.o. female presenting to clinic today for:  1.  Urinary symptoms Patient reports a one-week history of increased urinary frequency and dysuria.  Denies any associated urgency, nausea, vomiting, fevers, new back pain, hematuria.  2.  Left knee pain Patient reports that about 2 days ago, she feels like she may have twisted her left lower extremity causing pain in her left knee.  She describes the pain as an aching pain, exacerbated by walking.  No gross swelling or discoloration.  She has been using topical analgesics and elevating the left lower extremity with minimal improvement in symptoms.  No weakness, numbness or tingling.  3.  Concern for right great toe infection Patient reports that her right great toe has been somewhat tender along the lateral aspect of the cuticle.  She reports having had a history of ingrown toenail in the past that was removed by her podiatrist, Dr. Irving Shows.  Denies any exudate or bleeding from lesion.  She has Epsom salts at home for foot soaks.  She has not been taking any medications for the pain.   ROS: Per HPI  Allergies  Allergen Reactions  . Heparin Nausea And Vomiting  . Warfarin Other (See Comments)    unknown  . Penicillins Rash   Past Medical History:  Diagnosis Date  . Anxiety   . Hyperlipidemia   . Seizures ([place])     Current Outpatient Medications:  .  atorvastatin (LIPITOR) 20 MG tablet, Take 1 tablet (20 mg total) by mouth daily., Disp: 30 tablet, Rfl: 11 .  cholecalciferol (VITAMIN D) 1000 UNITS tablet, Take 1,000 Units by mouth daily. Reported on 05/26/2015, Disp: , Rfl:  .  lisinopril (PRINIVIL,ZESTRIL) 20 MG tablet, Take 1 tablet (20 mg total) by mouth daily., Disp: 90 tablet, Rfl: 3 .  Multiple Vitamins tablet, Take 1 tablet by mouth. Reported on 05/26/2015, Disp: , Rfl:  .  Omega-3 1000 MG CAPS, Take 1 g by mouth. Reported on 05/26/2015,  Disp: , Rfl:  Social History   Socioeconomic History  . Marital status: Married    Spouse name: Not on file  . Number of children: Not on file  . Years of education: Not on file  . Highest education level: Not on file  Occupational History  . Not on file  Social Needs  . Financial resource strain: Not on file  . Food insecurity:    Worry: Not on file    Inability: Not on file  . Transportation needs:    Medical: Not on file    Non-medical: Not on file  Tobacco Use  . Smoking status: Never Smoker  . Smokeless tobacco: Never Used  Substance and Sexual Activity  . Alcohol use: No  . Drug use: No  . Sexual activity: Not Currently  Lifestyle  . Physical activity:    Days per week: Not on file    Minutes per session: Not on file  . Stress: Not on file  Relationships  . Social connections:    Talks on phone: Not on file    Gets together: Not on file    Attends religious service: Not on file    Active member of club or organization: Not on file    Attends meetings of clubs or organizations: Not on file    Relationship status: Not on file  . Intimate partner violence:    Fear of current or ex partner: Not  on file    Emotionally abused: Not on file    Physically abused: Not on file    Forced sexual activity: Not on file  Other Topics Concern  . Not on file  Social History Narrative  . Not on file   Family History  Problem Relation Age of Onset  . Cancer Mother        lung    Objective: Office vital signs reviewed. BP (!) 158/69   Pulse 80   Temp (!) 97 F (36.1 C) (Oral)   Ht 5\' 2"  (1.575 m)   Wt 181 lb (82.1 kg)   BMI 33.11 kg/m   Physical Examination:  General: Awake, alert, well nourished, nontoxic, No acute distress HEENT: sclera white, MMM Cardio: regular rate and rhythm, S1S2 heard, no murmurs appreciated Extremities: warm, well perfused, No edema, cyanosis or clubbing; +2 pulses bilaterally MSK: normal gait and normal station  Left knee: No gross  deformities appreciated on visual inspection.  Patient has full active range of motion of left lower extremity.  No gross effusion or discoloration appreciated.  No tenderness to palpation to the patella, quads tendon, patellar tendon or posterior popliteal fossa.  She does have mild tenderness to palpation along the lateral aspect of the knee.  No ligamentous laxity.  Positive Thessaly noted.  Right foot: Lateral aspect of the great toe with mild inflammation.  No exudate or fluctuance appreciated. Skin: as above. Neuro: light touch sensation grossly in tact.  Results for orders placed or performed in visit on 05/08/17 (from the past 24 hour(s))  Urinalysis, Complete     Status: None   Collection Time: 05/08/17 10:51 AM  Result Value Ref Range   Specific Gravity, UA 1.010 1.005 - 1.030   pH, UA 6.0 5.0 - 7.5   Color, UA Yellow Yellow   Appearance Ur Clear Clear   Leukocytes, UA Negative Negative   Protein, UA Negative Negative/Trace   Glucose, UA Negative Negative   Ketones, UA Negative Negative   RBC, UA Negative Negative   Bilirubin, UA Negative Negative   Urobilinogen, Ur 0.2 0.2 - 1.0 mg/dL   Nitrite, UA Negative Negative   Microscopic Examination See below:    Narrative   Performed at:  [facility] [address] [place], [place]  [id number] Lab Director: Colletta Maryland [facility], Phone:  [phone]  Microscopic Examination     Status: Abnormal   Collection Time: 05/08/17 10:51 AM  Result Value Ref Range   WBC, UA 0-5 0 - 5 /hpf   RBC, UA None seen 0 - 2 /hpf   Epithelial Cells (non renal) 0-10 0 - 10 /hpf   Renal Epithel, UA None seen None seen /hpf   Bacteria, UA Moderate (A) None seen/Few   Narrative   Performed at:  [facility] [address], [place], [place]  [id number] Lab Director: Colletta Maryland [facility], Phone:  [phone]   Assessment/ Plan: 72 y.o. female   1. Acute cystitis without hematuria Patient is afebrile and nontoxic-appearing.   Urinalysis demonstrated no acute findings.  Urine microscopy with moderate bacteria and 0-5 white blood cells.  Given symptomatic nature, I have sent this for urine culture.  I have empirically started her on Septra DS p.o. twice daily for the next 3 days.  Will contact patient with results of culture once they are available.  Push oral fluids. - Urinalysis, Complete - Urine Culture  2. Acute pain of left knee Possibly related to degenerative  meniscal injury given positive Thessaly's on exam.  I wonder if she is altering her gait causing stress on the lateral aspect of the knee.  We discussed use of topical Voltaren gel versus oral NSAIDs.  Patient wished to proceed with topical analgesic.  This is been prescribed.  She may use 2 g 4 times daily as needed.  She will call me if this is not covered by insurance and we will plan to transition over to an oral analgesic.  If symptoms are persistent, would consider corticosteroid injection and imaging of the knee.  Follow-up as needed.  3. Ingrown toenail of right foot No evidence of gross infection.  Septra as above would cover.  I recommended Epsom salts soaks and follow-up with Dr. Irving Shows, her podiatrist if needed.   Orders Placed This Encounter  Procedures  . Microscopic Examination  . Urine Culture  . Urinalysis, Complete   Meds ordered this encounter  Medications  . diclofenac sodium (VOLTAREN) 1 % GEL    Sig: Apply 2 g topically 4 (four) times daily. (as needed for knee pain)    Dispense:  200 g    Refill:  1  . sulfamethoxazole-trimethoprim (BACTRIM DS) 800-160 MG tablet    Sig: Take 1 tablet by mouth 2 (two) times daily for 3 days.    Dispense:  6 tablet    Refill:  0     Christina Capili Windell Moulding, DO [facility] [phone]

## 2017-05-10 LAB — URINE CULTURE

## 2017-06-11 ENCOUNTER — Other Ambulatory Visit: Payer: Self-pay | Admitting: Physician Assistant

## 2017-07-03 DIAGNOSIS — Z86011 Personal history of benign neoplasm of the brain: Secondary | ICD-10-CM | POA: Diagnosis not present

## 2017-07-03 DIAGNOSIS — Z48811 Encounter for surgical aftercare following surgery on the nervous system: Secondary | ICD-10-CM | POA: Diagnosis not present

## 2017-07-03 DIAGNOSIS — D329 Benign neoplasm of meninges, unspecified: Secondary | ICD-10-CM | POA: Diagnosis not present

## 2017-07-29 ENCOUNTER — Ambulatory Visit: Payer: Medicare HMO | Admitting: Family

## 2017-07-30 ENCOUNTER — Ambulatory Visit (INDEPENDENT_AMBULATORY_CARE_PROVIDER_SITE_OTHER): Payer: Medicare HMO | Admitting: Family Medicine

## 2017-07-30 ENCOUNTER — Encounter: Payer: Self-pay | Admitting: Family Medicine

## 2017-07-30 VITALS — BP 138/77 | HR 86 | Temp 98.2°F | Ht 62.0 in | Wt 179.2 lb

## 2017-07-30 DIAGNOSIS — M79662 Pain in left lower leg: Secondary | ICD-10-CM | POA: Diagnosis not present
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
## 2017-07-31 ENCOUNTER — Telehealth: Payer: Self-pay | Admitting: Family

## 2017-07-31 ENCOUNTER — Ambulatory Visit (HOSPITAL_COMMUNITY)
Admission: RE | Admit: 2017-07-31 | Discharge: 2017-07-31 | Disposition: A | Discharge status: Home / Self Care | Payer: Medicare HMO | Source: Ambulatory Visit | Attending: Family Medicine | Admitting: Family Medicine

## 2017-07-31 ENCOUNTER — Telehealth: Payer: Self-pay | Admitting: Family Medicine

## 2017-07-31 DIAGNOSIS — I82412 Acute embolism and thrombosis of left femoral vein: Secondary | ICD-10-CM | POA: Insufficient documentation

## 2017-07-31 DIAGNOSIS — I82492 Acute embolism and thrombosis of other specified deep vein of left lower extremity: Secondary | ICD-10-CM | POA: Insufficient documentation

## 2017-07-31 DIAGNOSIS — I82432 Acute embolism and thrombosis of left popliteal vein: Secondary | ICD-10-CM | POA: Insufficient documentation

## 2017-07-31 DIAGNOSIS — M79662 Pain in left lower leg: Secondary | ICD-10-CM

## 2017-07-31 MED ORDER — APIXABAN 5 MG PO TABS: 5.0000 mg | ORAL_TABLET | Freq: Two times a day (BID) | ORAL | 1 refills | Status: DC

## 2017-07-31 NOTE — Telephone Encounter (Signed)
Patient to come by to pick up samples, directions given to patient.

## 2017-07-31 NOTE — Telephone Encounter (Signed)
Pt aware to go to [facility] now and will be worked in

## 2017-07-31 NOTE — Telephone Encounter (Signed)
Patient has DVT extensive throughout her left leg, will give Eliquis, she needs to start with 10 mg twice daily for 1 week and then go to 5 mg twice daily Caryl Pina, MD [facility] 07/31/2017, 11:13 AM

## 2017-08-01 ENCOUNTER — Telehealth: Payer: Self-pay | Admitting: Family Medicine

## 2017-08-01 ENCOUNTER — Other Ambulatory Visit: Payer: Self-pay | Admitting: Family Medicine

## 2017-08-01 MED ORDER — TRAMADOL HCL 50 MG PO TABS: 50.0000 mg | ORAL_TABLET | Freq: Three times a day (TID) | ORAL | 1 refills | Status: DC | PRN

## 2017-08-01 NOTE — Telephone Encounter (Signed)
Patient reports she is still having pain in her calf area, worse right behind her knee.  She would like to know if there is anything else she could do to help other than elevate the leg.  Please advise.

## 2017-08-01 NOTE — Progress Notes (Signed)
Patient's was given a blood thinner, that does not help with the pain is much, I sent in some tramadol for her and she can use that for pain until the clot dissipates some. Caryl Pina, MD [facility] 08/01/2017, 4:47 PM

## 2017-08-01 NOTE — Progress Notes (Signed)
Pt aware of med at [facility]

## 2017-08-21 ENCOUNTER — Inpatient Hospital Stay (HOSPITAL_COMMUNITY): Payer: Medicare HMO

## 2017-08-21 ENCOUNTER — Inpatient Hospital Stay (HOSPITAL_COMMUNITY): Payer: Medicare HMO | Attending: Internal Medicine | Admitting: Internal Medicine

## 2017-08-21 ENCOUNTER — Encounter (HOSPITAL_COMMUNITY): Payer: Self-pay | Admitting: Internal Medicine

## 2017-08-21 DIAGNOSIS — Z86718 Personal history of other venous thrombosis and embolism: Secondary | ICD-10-CM

## 2017-08-21 DIAGNOSIS — L821 Other seborrheic keratosis: Secondary | ICD-10-CM | POA: Diagnosis not present

## 2017-08-21 DIAGNOSIS — I82432 Acute embolism and thrombosis of left popliteal vein: Secondary | ICD-10-CM | POA: Diagnosis not present

## 2017-08-21 DIAGNOSIS — Z79899 Other long term (current) drug therapy: Secondary | ICD-10-CM | POA: Diagnosis not present

## 2017-08-21 DIAGNOSIS — I82412 Acute embolism and thrombosis of left femoral vein: Secondary | ICD-10-CM

## 2017-08-21 DIAGNOSIS — I82492 Acute embolism and thrombosis of other specified deep vein of left lower extremity: Secondary | ICD-10-CM

## 2017-08-21 DIAGNOSIS — Z7901 Long term (current) use of anticoagulants: Secondary | ICD-10-CM

## 2017-08-21 DIAGNOSIS — I10 Essential (primary) hypertension: Secondary | ICD-10-CM | POA: Diagnosis not present

## 2017-08-21 LAB — CBC WITH DIFFERENTIAL/PLATELET
BASOS ABS: 0 10*3/uL (ref 0.0–0.1)
Basophils Relative: 0 %
EOS PCT: 1 %
Eosinophils Absolute: 0.1 10*3/uL (ref 0.0–0.7)
HCT: 40.7 % (ref 36.0–46.0)
Hemoglobin: 13.5 g/dL (ref 12.0–15.0)
LYMPHS PCT: 37 %
Lymphs Abs: 3.1 10*3/uL (ref 0.7–4.0)
MCH: 29.6 pg (ref 26.0–34.0)
MCHC: 33.2 g/dL (ref 30.0–36.0)
MCV: 89.3 fL (ref 78.0–100.0)
Monocytes Absolute: 0.6 10*3/uL (ref 0.1–1.0)
Monocytes Relative: 7 %
NEUTROS ABS: 4.5 10*3/uL (ref 1.7–7.7)
Neutrophils Relative %: 55 %
PLATELETS: 181 10*3/uL (ref 150–400)
RBC: 4.56 MIL/uL (ref 3.87–5.11)
RDW: 12.9 % (ref 11.5–15.5)
WBC: 8.3 10*3/uL (ref 4.0–10.5)

## 2017-08-21 LAB — PROTIME-INR
INR: 1.15
Prothrombin Time: 14.7 seconds (ref 11.4–15.2)

## 2017-08-21 LAB — COMPREHENSIVE METABOLIC PANEL
ALT: 58 U/L — AB (ref 0–44)
AST: 43 U/L — ABNORMAL HIGH (ref 15–41)
Albumin: 4 g/dL (ref 3.5–5.0)
Alkaline Phosphatase: 29 U/L — ABNORMAL LOW (ref 38–126)
Anion gap: 8 (ref 5–15)
BUN: 20 mg/dL (ref 8–23)
CHLORIDE: 104 mmol/L (ref 98–111)
CO2: 28 mmol/L (ref 22–32)
CREATININE: 0.66 mg/dL (ref 0.44–1.00)
Calcium: 9.6 mg/dL (ref 8.9–10.3)
GFR calc Af Amer: >60 mL/min (ref >60)
Glucose, Bld: 110 mg/dL — ABNORMAL HIGH (ref 70–99)
POTASSIUM: 4.4 mmol/L (ref 3.5–5.1)
Sodium: 140 mmol/L (ref 135–145)
Total Bilirubin: 0.9 mg/dL (ref 0.3–1.2)
Total Protein: 7.6 g/dL (ref 6.5–8.1)

## 2017-08-21 LAB — FERRITIN: Ferritin: 84 ng/mL (ref 11–307)

## 2017-08-21 LAB — APTT: APTT: 28 s (ref 24–36)

## 2017-08-21 LAB — LACTATE DEHYDROGENASE: LDH: 184 U/L (ref 98–192)

## 2017-08-21 NOTE — Patient Instructions (Signed)
[facility] Discharge Instructions  You saw Dr. Higgs today.   Thank you for choosing [facility] at [facility] to provide your oncology and hematology care.  To afford each patient quality time with our provider, please arrive at least 15 minutes before your scheduled appointment time.   If you have a lab appointment with the Cancer Center please come in thru the  Main Entrance and check in at the main information desk  You need to re-schedule your appointment should you arrive 10 or more minutes late.  We strive to give you quality time with our providers, and arriving late affects you and other patients whose appointments are after yours.  Also, if you no show three or more times for appointments you may be dismissed from the clinic at the providers discretion.     Again, thank you for choosing [facility].  Our hope is that these requests will decrease the amount of time that you wait before being seen by our physicians.       _____________________________________________________________  Should you have questions after your visit to [facility], please contact our office at [phone] between the hours of 8:30 a.m. and 4:30 p.m.  Voicemails left after 4:30 p.m. will not be returned until the following business day.  For prescription refill requests, have your pharmacy contact our office.       Resources For Cancer Patients and their Caregivers ? American Cancer Society: Can assist with transportation, wigs, general needs, runs Look Good Feel Better.        1-888-227-6333 ? Cancer Care: Provides financial assistance, online support groups, medication/co-pay assistance.  1-800-813-HOPE (4673) ? [facility] Assists [place] cancer patients and their families through emotional , educational and financial support.  336-427-4357 ? [place] DSS Where to apply for food  stamps, Medicaid and utility assistance. 336-342-1394 ? [facility]: Transportation to medical appointments. 336-347-2287 ? Social Security Administration: May apply for disability if have a Stage IV cancer. 336-342-7796 1-800-772-1213 ? [place] Aging, Disability and Transit Services: Assists with nutrition, care and transit needs. 336-349-2343  Cancer Center Support Programs:   > Cancer Support Group  2nd Tuesday of the month 1pm-2pm, Journey Room   > Creative Journey  3rd Tuesday of the month 1130am-1pm, Journey Room     

## 2017-08-22 LAB — BETA-2-GLYCOPROTEIN I ABS, IGG/M/A: Beta-2 Glyco I IgG: <9 GPI IgG units (ref 0–20)

## 2017-08-22 LAB — LUPUS ANTICOAGULANT PANEL
DRVVT: 41.4 s (ref 0.0–47.0)
PTT Lupus Anticoagulant: 29.4 s (ref 0.0–51.9)
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
## 2017-08-23 LAB — ANTINUCLEAR ANTIBODIES, IFA: ANTINUCLEAR ANTIBODIES, IFA: NEGATIVE

## 2017-08-25 LAB — FACTOR 5 LEIDEN

## 2017-08-26 LAB — PROTHROMBIN GENE MUTATION

## 2017-09-04 ENCOUNTER — Ambulatory Visit (HOSPITAL_COMMUNITY)
Admission: RE | Admit: 2017-09-04 | Discharge: 2017-09-04 | Disposition: A | Discharge status: Home / Self Care | Payer: Medicare HMO | Source: Ambulatory Visit | Attending: Internal Medicine | Admitting: Internal Medicine

## 2017-09-04 DIAGNOSIS — I82429 Acute embolism and thrombosis of unspecified iliac vein: Secondary | ICD-10-CM | POA: Insufficient documentation

## 2017-09-04 DIAGNOSIS — R918 Other nonspecific abnormal finding of lung field: Secondary | ICD-10-CM | POA: Insufficient documentation

## 2017-09-04 DIAGNOSIS — I700 Atherosclerosis of aorta: Secondary | ICD-10-CM | POA: Insufficient documentation

## 2017-09-04 DIAGNOSIS — I2510 Atherosclerotic heart disease of native coronary artery without angina pectoris: Secondary | ICD-10-CM | POA: Insufficient documentation

## 2017-09-04 DIAGNOSIS — K449 Diaphragmatic hernia without obstruction or gangrene: Secondary | ICD-10-CM | POA: Diagnosis not present

## 2017-09-04 DIAGNOSIS — I82492 Acute embolism and thrombosis of other specified deep vein of left lower extremity: Secondary | ICD-10-CM

## 2017-09-04 MED ORDER — IOPAMIDOL (ISOVUE-300) INJECTION 61%
100.0000 mL | Freq: Once | INTRAVENOUS | Status: AC | PRN
  Administered 2017-09-04: 100 mL via INTRAVENOUS

## 2017-09-08 ENCOUNTER — Ambulatory Visit (HOSPITAL_COMMUNITY): Payer: Medicare HMO | Admitting: Internal Medicine

## 2017-09-09 ENCOUNTER — Encounter (HOSPITAL_COMMUNITY): Payer: Self-pay | Admitting: Internal Medicine

## 2017-09-09 ENCOUNTER — Inpatient Hospital Stay (HOSPITAL_COMMUNITY): Payer: Medicare HMO | Attending: Internal Medicine | Admitting: Internal Medicine

## 2017-09-09 VITALS — BP 161/59 | HR 71 | Temp 98.7°F | Resp 16 | Wt 176.4 lb

## 2017-09-09 DIAGNOSIS — I82432 Acute embolism and thrombosis of left popliteal vein: Secondary | ICD-10-CM

## 2017-09-09 DIAGNOSIS — Z7901 Long term (current) use of anticoagulants: Secondary | ICD-10-CM | POA: Insufficient documentation

## 2017-09-09 DIAGNOSIS — I10 Essential (primary) hypertension: Secondary | ICD-10-CM | POA: Diagnosis not present

## 2017-09-09 DIAGNOSIS — L821 Other seborrheic keratosis: Secondary | ICD-10-CM | POA: Diagnosis not present

## 2017-09-09 DIAGNOSIS — I82412 Acute embolism and thrombosis of left femoral vein: Secondary | ICD-10-CM | POA: Diagnosis not present

## 2017-09-09 DIAGNOSIS — Z86718 Personal history of other venous thrombosis and embolism: Secondary | ICD-10-CM | POA: Insufficient documentation

## 2017-09-09 DIAGNOSIS — R911 Solitary pulmonary nodule: Secondary | ICD-10-CM | POA: Diagnosis not present

## 2017-09-09 DIAGNOSIS — R918 Other nonspecific abnormal finding of lung field: Secondary | ICD-10-CM

## 2017-09-09 DIAGNOSIS — I82492 Acute embolism and thrombosis of other specified deep vein of left lower extremity: Secondary | ICD-10-CM

## 2017-09-09 MED ORDER — APIXABAN 5 MG PO TABS: 5.0000 mg | ORAL_TABLET | Freq: Two times a day (BID) | ORAL | 4 refills | Status: DC

## 2017-09-09 NOTE — Patient Instructions (Signed)
[facility] Discharge Instructions  You saw Dr. Higgs today.   Thank you for choosing  [facility] at [facility] to provide your oncology and hematology care.  To afford each patient quality time with our provider, please arrive at least 15 minutes before your scheduled appointment time.   If you have a lab appointment with the Cancer Center please come in thru the  Main Entrance and check in at the main information desk  You need to re-schedule your appointment should you arrive 10 or more minutes late.  We strive to give you quality time with our providers, and arriving late affects you and other patients whose appointments are after yours.  Also, if you no show three or more times for appointments you may be dismissed from the clinic at the providers discretion.     Again, thank you for choosing [facility].  Our hope is that these requests will decrease the amount of time that you wait before being seen by our physicians.       _____________________________________________________________  Should you have questions after your visit to [facility], please contact our office at [phone] between the hours of 8:00 a.m. and 4:30 p.m.  Voicemails left after 4:00 p.m. will not be returned until the following business day.  For prescription refill requests, have your pharmacy contact our office and allow 72 hours.    Cancer Center Support Programs:   > Cancer Support Group  2nd Tuesday of the month 1pm-2pm, Journey Room    
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
## 2017-09-23 ENCOUNTER — Other Ambulatory Visit: Payer: Self-pay | Admitting: Physician Assistant

## 2017-11-05 ENCOUNTER — Other Ambulatory Visit: Payer: Self-pay | Admitting: Physician Assistant

## 2017-11-07 NOTE — Telephone Encounter (Signed)
[place]. NTBS. 30 days given 09/24/17

## 2017-11-10 ENCOUNTER — Other Ambulatory Visit: Payer: Self-pay | Admitting: Family

## 2017-11-10 MED ORDER — LISINOPRIL 20 MG PO TABS: 20.0000 mg | ORAL_TABLET | Freq: Every day | ORAL | 0 refills | Status: DC

## 2017-11-10 NOTE — Telephone Encounter (Signed)
Pt aware refill sent to pharmacy 

## 2017-11-18 ENCOUNTER — Encounter: Payer: Self-pay | Admitting: Family

## 2017-11-18 ENCOUNTER — Ambulatory Visit (INDEPENDENT_AMBULATORY_CARE_PROVIDER_SITE_OTHER): Payer: Medicare HMO | Admitting: Family

## 2017-11-18 VITALS — BP 142/65 | HR 68 | Temp 99.3°F | Ht 62.0 in | Wt 177.8 lb

## 2017-11-18 DIAGNOSIS — R569 Unspecified convulsions: Secondary | ICD-10-CM

## 2017-11-18 DIAGNOSIS — I10 Essential (primary) hypertension: Secondary | ICD-10-CM | POA: Diagnosis not present

## 2017-11-18 DIAGNOSIS — E669 Obesity, unspecified: Secondary | ICD-10-CM | POA: Diagnosis not present

## 2017-11-18 DIAGNOSIS — Z23 Encounter for immunization: Secondary | ICD-10-CM

## 2017-11-18 DIAGNOSIS — E785 Hyperlipidemia, unspecified: Secondary | ICD-10-CM

## 2017-11-18 DIAGNOSIS — F411 Generalized anxiety disorder: Secondary | ICD-10-CM

## 2017-11-18 DIAGNOSIS — Z86718 Personal history of other venous thrombosis and embolism: Secondary | ICD-10-CM | POA: Diagnosis not present

## 2017-11-18 DIAGNOSIS — R69 Illness, unspecified: Secondary | ICD-10-CM | POA: Diagnosis not present

## 2017-11-18 MED ORDER — LISINOPRIL 20 MG PO TABS: 20.0000 mg | ORAL_TABLET | Freq: Every day | ORAL | 4 refills | Status: DC

## 2017-11-18 NOTE — Patient Instructions (Signed)

## 2017-11-18 NOTE — Progress Notes (Signed)
Subjective:    Patient ID: Christina Gonzales, female    DOB: August 07, 1945, 72 y.o.   MRN: [id number]  Chief Complaint  Patient presents with  . Medical Management of Chronic Issues    medication refill   PT presents to the office today for chronic follow up. PT is followed by Neurologists for seizures. Her last one was about 3 weeks ago. She has a MRI scan of her head scheduled.    Pt had a DVT in her left leg in 07/2017. She is currently taking Eliquis. Doing well.  Seizures   This is a chronic problem. Characteristics do not include bowel incontinence or bladder incontinence.  Hypertension  This is a chronic problem. The current episode started more than 1 year ago. The problem has been waxing and waning since onset. The problem is uncontrolled. Associated symptoms include anxiety. Pertinent negatives include no peripheral edema or shortness of breath. Past treatments include ACE inhibitors. The current treatment provides mild improvement. There is no history of CAD/MI, CVA or heart failure.  Hyperlipidemia  This is a chronic problem. The current episode started more than 1 year ago. The problem is uncontrolled. Recent lipid tests were reviewed and are high. Exacerbating diseases include obesity. Pertinent negatives include no shortness of breath. Current antihyperlipidemic treatment includes diet change. The current treatment provides mild improvement of lipids. Risk factors for coronary artery disease include dyslipidemia, hypertension and a sedentary lifestyle.  Anxiety  Presents for follow-up visit. Symptoms include depressed mood, excessive worry, irritability, nervous/anxious behavior and panic. Patient reports no shortness of breath. Symptoms occur most days. The severity of symptoms is mild.        Review of Systems  Constitutional: Positive for irritability.  Respiratory: Negative for shortness of breath.   Gastrointestinal: Negative for bowel incontinence.  Genitourinary:  Negative for bladder incontinence.  Neurological: Positive for seizures.  Psychiatric/Behavioral: The patient is nervous/anxious.   All other systems reviewed and are negative.      Objective:   Physical Exam  Constitutional: She is oriented to person, place, and time. She appears well-developed and well-nourished. No distress.  HENT:  Head: Normocephalic and atraumatic.  Right Ear: External ear normal.  Left Ear: External ear normal.  Mouth/Throat: Oropharynx is clear and moist.  Eyes: Pupils are equal, round, and reactive to light.  Neck: Normal range of motion. Neck supple. No thyromegaly present.  Cardiovascular: Normal rate, regular rhythm, normal heart sounds and intact distal pulses.  No murmur heard. Pulmonary/Chest: Effort normal and breath sounds normal. No respiratory distress. She has no wheezes.  Abdominal: Soft. Bowel sounds are normal. She exhibits no distension. There is no tenderness.  Musculoskeletal: Normal range of motion. She exhibits no edema or tenderness.  Neurological: She is alert and oriented to person, place, and time. She has normal reflexes. No cranial nerve deficit.  Skin: Skin is warm and dry.  Psychiatric: She has a normal mood and affect. Her behavior is normal. Judgment and thought content normal.  Vitals reviewed.   BP (!) 142/65   Pulse 68   Temp 99.3 F (37.4 C) (Oral)   Ht _0  (1.575 m)   Wt 177 lb 12.8 oz (80.6 kg)   BMI 32.52 kg/m      Assessment & Plan:  Christina Gonzales comes in today with chief complaint of Medical Management of Chronic Issues (medication refill)   Diagnosis and orders addressed:  1. Essential hypertension, benign - CMP14+EGFR - Lipid panel  2. Seizures (  [place]) - CMP14+EGFR - Lipid panel  3. Hyperlipidemia, unspecified hyperlipidemia type - CMP14+EGFR - Lipid panel  4. Obesity (BMI 30-39.9) - CMP14+EGFR - Lipid panel  5. GAD (generalized anxiety disorder) - CMP14+EGFR - Lipid panel  6.  History of deep vein thrombosis (DVT) of lower extremity - CMP14+EGFR   Labs pending Health Maintenance reviewed Diet and exercise encouraged  Follow up plan: 6 months    [name], FNP

## 2017-11-18 NOTE — Addendum Note (Signed)
Addended by: Shelbie Ammons on: 11/18/2017 04:28 PM   Modules accepted: Orders

## 2017-12-29 DIAGNOSIS — D320 Benign neoplasm of cerebral meninges: Secondary | ICD-10-CM | POA: Diagnosis not present

## 2017-12-29 DIAGNOSIS — Z923 Personal history of irradiation: Secondary | ICD-10-CM | POA: Diagnosis not present

## 2017-12-29 DIAGNOSIS — D329 Benign neoplasm of meninges, unspecified: Secondary | ICD-10-CM | POA: Diagnosis not present

## 2018-01-06 ENCOUNTER — Ambulatory Visit (HOSPITAL_COMMUNITY)
Admission: RE | Admit: 2018-01-06 | Discharge: 2018-01-06 | Disposition: A | Discharge status: Home / Self Care | Payer: Medicare HMO | Source: Ambulatory Visit | Attending: Internal Medicine | Admitting: Internal Medicine

## 2018-01-06 ENCOUNTER — Ambulatory Visit (HOSPITAL_COMMUNITY): Payer: Medicare HMO

## 2018-01-06 ENCOUNTER — Other Ambulatory Visit (HOSPITAL_COMMUNITY): Payer: Medicare HMO

## 2018-01-06 DIAGNOSIS — K760 Fatty (change of) liver, not elsewhere classified: Secondary | ICD-10-CM | POA: Diagnosis not present

## 2018-01-06 DIAGNOSIS — K449 Diaphragmatic hernia without obstruction or gangrene: Secondary | ICD-10-CM | POA: Diagnosis not present

## 2018-01-06 DIAGNOSIS — I700 Atherosclerosis of aorta: Secondary | ICD-10-CM | POA: Insufficient documentation

## 2018-01-06 DIAGNOSIS — R918 Other nonspecific abnormal finding of lung field: Secondary | ICD-10-CM | POA: Diagnosis not present

## 2018-01-06 DIAGNOSIS — Z86718 Personal history of other venous thrombosis and embolism: Secondary | ICD-10-CM | POA: Diagnosis not present

## 2018-01-06 DIAGNOSIS — I2510 Atherosclerotic heart disease of native coronary artery without angina pectoris: Secondary | ICD-10-CM | POA: Diagnosis not present

## 2018-01-06 DIAGNOSIS — R911 Solitary pulmonary nodule: Secondary | ICD-10-CM | POA: Diagnosis not present

## 2018-01-06 DIAGNOSIS — I82492 Acute embolism and thrombosis of other specified deep vein of left lower extremity: Secondary | ICD-10-CM | POA: Diagnosis present

## 2018-01-06 LAB — POCT I-STAT CREATININE: CREATININE: 0.7 mg/dL (ref 0.44–1.00)

## 2018-01-06 MED ORDER — IOHEXOL 300 MG/ML  SOLN
75.0000 mL | Freq: Once | INTRAMUSCULAR | Status: AC | PRN
  Administered 2018-01-06: 75 mL via INTRAVENOUS

## 2018-01-08 ENCOUNTER — Inpatient Hospital Stay (HOSPITAL_COMMUNITY): Payer: Medicare HMO | Attending: Hematology | Admitting: Internal Medicine

## 2018-01-08 ENCOUNTER — Other Ambulatory Visit: Payer: Self-pay

## 2018-01-08 ENCOUNTER — Inpatient Hospital Stay (HOSPITAL_COMMUNITY): Payer: Medicare HMO

## 2018-01-08 DIAGNOSIS — Z86718 Personal history of other venous thrombosis and embolism: Secondary | ICD-10-CM | POA: Diagnosis not present

## 2018-01-08 DIAGNOSIS — Z7901 Long term (current) use of anticoagulants: Secondary | ICD-10-CM | POA: Diagnosis not present

## 2018-01-08 DIAGNOSIS — R918 Other nonspecific abnormal finding of lung field: Secondary | ICD-10-CM

## 2018-01-08 DIAGNOSIS — L821 Other seborrheic keratosis: Secondary | ICD-10-CM | POA: Diagnosis not present

## 2018-01-08 DIAGNOSIS — R945 Abnormal results of liver function studies: Secondary | ICD-10-CM | POA: Insufficient documentation

## 2018-01-08 DIAGNOSIS — I82492 Acute embolism and thrombosis of other specified deep vein of left lower extremity: Secondary | ICD-10-CM

## 2018-01-08 DIAGNOSIS — R911 Solitary pulmonary nodule: Secondary | ICD-10-CM | POA: Insufficient documentation

## 2018-01-08 DIAGNOSIS — I10 Essential (primary) hypertension: Secondary | ICD-10-CM | POA: Diagnosis not present

## 2018-01-08 DIAGNOSIS — I82432 Acute embolism and thrombosis of left popliteal vein: Secondary | ICD-10-CM | POA: Diagnosis not present

## 2018-01-08 DIAGNOSIS — I82412 Acute embolism and thrombosis of left femoral vein: Secondary | ICD-10-CM | POA: Diagnosis not present

## 2018-01-08 LAB — CBC WITH DIFFERENTIAL/PLATELET
Abs Immature Granulocytes: 0.02 10*3/uL (ref 0.00–0.07)
BASOS ABS: 0.1 10*3/uL (ref 0.0–0.1)
Basophils Relative: 1 %
Eosinophils Absolute: 0.1 10*3/uL (ref 0.0–0.5)
Eosinophils Relative: 2 %
HCT: 43.3 % (ref 36.0–46.0)
Hemoglobin: 13.8 g/dL (ref 12.0–15.0)
Immature Granulocytes: 0 %
Lymphocytes Relative: 36 %
Lymphs Abs: 2.3 10*3/uL (ref 0.7–4.0)
MCH: 28.7 pg (ref 26.0–34.0)
MCHC: 31.9 g/dL (ref 30.0–36.0)
MCV: 90 fL (ref 80.0–100.0)
Monocytes Absolute: 0.5 10*3/uL (ref 0.1–1.0)
Monocytes Relative: 8 %
NEUTROS ABS: 3.5 10*3/uL (ref 1.7–7.7)
Neutrophils Relative %: 53 %
PLATELETS: 182 10*3/uL (ref 150–400)
RBC: 4.81 MIL/uL (ref 3.87–5.11)
RDW: 12.7 % (ref 11.5–15.5)
WBC: 6.5 10*3/uL (ref 4.0–10.5)
nRBC: 0 % (ref 0.0–0.2)

## 2018-01-08 LAB — COMPREHENSIVE METABOLIC PANEL
ALT: 55 U/L — ABNORMAL HIGH (ref 0–44)
AST: 43 U/L — ABNORMAL HIGH (ref 15–41)
Albumin: 3.9 g/dL (ref 3.5–5.0)
Alkaline Phosphatase: 27 U/L — ABNORMAL LOW (ref 38–126)
Anion gap: 7 (ref 5–15)
BUN: 15 mg/dL (ref 8–23)
CO2: 26 mmol/L (ref 22–32)
Calcium: 9.6 mg/dL (ref 8.9–10.3)
Chloride: 104 mmol/L (ref 98–111)
Creatinine, Ser: 0.73 mg/dL (ref 0.44–1.00)
GFR calc Af Amer: >60 mL/min (ref >60)
GFR calc non Af Amer: >60 mL/min (ref >60)
GLUCOSE: 93 mg/dL (ref 70–99)
Potassium: 4.2 mmol/L (ref 3.5–5.1)
Sodium: 137 mmol/L (ref 135–145)
Total Bilirubin: 0.4 mg/dL (ref 0.3–1.2)
Total Protein: 7.6 g/dL (ref 6.5–8.1)

## 2018-01-08 LAB — LACTATE DEHYDROGENASE: LDH: 167 U/L (ref 98–192)

## 2018-01-08 MED ORDER — APIXABAN 5 MG PO TABS: 5.0000 mg | ORAL_TABLET | Freq: Two times a day (BID) | ORAL | 12 refills | Status: DC
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
## 2018-01-08 NOTE — Patient Instructions (Signed)
[facility]                                                                       You saw Dr. Higgs today.  _______________________________________________________________  Thank you for choosing [facility] to provide your oncology and hematology care.  To afford each patient quality time with our providers, please arrive at least 15 minutes before your scheduled appointment.  You need to re-schedule your appointment if you arrive 10 or more minutes late.  We strive to give you quality time with our providers, and arriving late affects you and other patients whose appointments are after yours.  Also, if you no show three or more times for appointments you may be dismissed from the clinic.  Again, thank you for choosing [facility]. Our hope is that these requests will allow you access to exceptional care and in a timely manner. _______________________________________________________________  If you have questions after your visit, please contact our office at [phone] between the hours of 8:30 a.m. and 5:00 p.m. Voicemails left after 4:30 p.m. will not be returned until the following business day. _______________________________________________________________  For prescription refill requests, have your pharmacy contact our office. _______________________________________________________________  Recommendations made by the consultant and any test results will be sent to your referring physician. _______________________________________________________________ 

## 2018-01-12 ENCOUNTER — Encounter: Payer: Self-pay | Admitting: Internal Medicine

## 2018-02-01 DIAGNOSIS — H524 Presbyopia: Secondary | ICD-10-CM | POA: Diagnosis not present

## 2018-02-02 DIAGNOSIS — H524 Presbyopia: Secondary | ICD-10-CM | POA: Diagnosis not present

## 2018-02-02 DIAGNOSIS — H52222 Regular astigmatism, left eye: Secondary | ICD-10-CM | POA: Diagnosis not present

## 2018-03-03 ENCOUNTER — Ambulatory Visit (INDEPENDENT_AMBULATORY_CARE_PROVIDER_SITE_OTHER): Payer: Medicare HMO | Admitting: Family Medicine

## 2018-03-03 ENCOUNTER — Encounter: Payer: Self-pay | Admitting: Family Medicine

## 2018-03-03 VITALS — BP 134/80 | HR 76 | Temp 97.4°F | Ht 62.0 in | Wt 181.0 lb

## 2018-03-03 DIAGNOSIS — F320 Major depressive disorder, single episode, mild: Secondary | ICD-10-CM

## 2018-03-03 DIAGNOSIS — F4321 Adjustment disorder with depressed mood: Secondary | ICD-10-CM | POA: Diagnosis not present

## 2018-03-03 DIAGNOSIS — R69 Illness, unspecified: Secondary | ICD-10-CM | POA: Diagnosis not present

## 2018-03-03 MED ORDER — HYDROXYZINE HCL 25 MG PO TABS: 25.0000 mg | ORAL_TABLET | Freq: Every evening | ORAL | 0 refills | Status: DC | PRN

## 2018-03-03 MED ORDER — SERTRALINE HCL 50 MG PO TABS: 50.0000 mg | ORAL_TABLET | Freq: Every day | ORAL | 3 refills | Status: DC

## 2018-03-03 NOTE — Patient Instructions (Signed)
If your symptoms worsen or you have thoughts of suicide/homicide, PLEASE SEEK IMMEDIATE MEDICAL ATTENTION.  You may always call the National Suicide Hotline.  This is available 24 hours a day, 7 days a week.  Their number is: 1-800-273-8255  Taking the medicine as directed and not missing any doses is one of the best things you can do to treat your depression.  Here are some things to keep in mind:  1) Side effects (stomach upset, some increased anxiety) may happen before you notice a benefit.  These side effects typically go away over time. 2) Changes to your dose of medicine or a change in medication all together is sometimes necessary 3) Most people need to be on medication at least 12 months 4) Many people will notice an improvement within two weeks but the full effect of the medication can take up to 4-6 weeks 5) Stopping the medication when you start feeling better often results in a return of symptoms 6) Never discontinue your medication without contacting a health care professional first.  Some medications require gradual discontinuation/ taper and can make you sick if you stop them abruptly.  If your symptoms worsen or you have thoughts of suicide/homicide, PLEASE SEEK IMMEDIATE MEDICAL ATTENTION.  You may always call:  National Suicide Hotline: 800-273-8255 [place] Crisis Line: 336-832-9700 Crisis Recovery in [place]: 800-939-5911   These are available 24 hours a day, 7 days a week.   

## 2018-03-03 NOTE — Progress Notes (Signed)
Subjective:    Patient ID: Christina Gonzales, female    DOB: 10-05-1945, 73 y.o.   MRN: [id number]  Chief Complaint:  Depression (son passed away in January 10, 2023, brother passed away 4 days ago)   HPI: Christina Gonzales is a 73 y.o. female presenting on 03/03/2018 for Depression (son passed away in 10-Jan-2023, brother passed away 4 days ago)  Pt presents today with complaints of depression and grief. Pt states her brother passed away unexpectedly on 2022/05/13. States her son passed away a few months ago. States the passing of her brother has caused a resurface of her grief from her sons death. Pt states she is having trouble concentrating and sleeping due to this. States she has noticed some fatigue and decreased energy. She denies SI.    Office Visit from 03/03/2018 in [place]  PHQ-9 Total Score  8       Relevant past medical, surgical, family, and social history reviewed and updated as indicated.  Allergies and medications reviewed and updated.   Past Medical History:  Diagnosis Date  . Anxiety   . Hyperlipidemia   . Seizures ([place])     Past Surgical History:  Procedure Laterality Date  . TUMOR EXCISION     Behind left eye    Social History   Socioeconomic History  . Marital status: Married    Spouse name: Not on file  . Number of children: Not on file  . Years of education: Not on file  . Highest education level: Not on file  Occupational History  . Not on file  Social Needs  . Financial resource strain: Not on file  . Food insecurity:    Worry: Not on file    Inability: Not on file  . Transportation needs:    Medical: Not on file    Non-medical: Not on file  Tobacco Use  . Smoking status: Never Smoker  . Smokeless tobacco: Never Used  Substance and Sexual Activity  . Alcohol use: No  . Drug use: No  . Sexual activity: Not Currently  Lifestyle  . Physical activity:    Days per week: Not on file    Minutes per session: Not on  file  . Stress: Not on file  Relationships  . Social connections:    Talks on phone: Not on file    Gets together: Not on file    Attends religious service: Not on file    Active member of club or organization: Not on file    Attends meetings of clubs or organizations: Not on file    Relationship status: Not on file  . Intimate partner violence:    Fear of current or ex partner: Not on file    Emotionally abused: Not on file    Physically abused: Not on file    Forced sexual activity: Not on file  Other Topics Concern  . Not on file  Social History Narrative  . Not on file    Outpatient Encounter Medications as of 03/03/2018  Medication Sig  . apixaban (ELIQUIS) 5 MG TABS tablet Take 1 tablet (5 mg total) by mouth 2 (two) times daily.  . cholecalciferol (VITAMIN D) 1000 UNITS tablet Take 1,000 Units by mouth daily. Reported on 05/26/2015  . lisinopril (PRINIVIL,ZESTRIL) 20 MG tablet Take 1 tablet (20 mg total) by mouth daily.  . Multiple Vitamins tablet Take 1 tablet by mouth. Reported on 05/26/2015  . Omega-3 1000 MG CAPS Take 1  g by mouth. Reported on 05/26/2015  . hydrOXYzine (ATARAX/VISTARIL) 25 MG tablet Take 1 tablet (25 mg total) by mouth at bedtime as needed (insomnia).  . sertraline (ZOLOFT) 50 MG tablet Take 1 tablet (50 mg total) by mouth daily.   No facility-administered encounter medications on file as of 03/03/2018.     Allergies  Allergen Reactions  . Heparin Nausea And Vomiting  . Warfarin Other (See Comments)    unknown  . Penicillins Rash    Review of Systems  Constitutional: Positive for activity change, appetite change and fatigue. Negative for chills, fever and unexpected weight change.  Respiratory: Negative for cough, chest tightness and shortness of breath.   Cardiovascular: Negative for chest pain, palpitations and leg swelling.  Gastrointestinal: Negative for abdominal pain, constipation, diarrhea, nausea and vomiting.  Neurological: Negative for  dizziness, weakness, light-headedness and headaches.  Psychiatric/Behavioral: Positive for decreased concentration, dysphoric mood and sleep disturbance. Negative for agitation, behavioral problems, confusion, hallucinations, self-injury and suicidal ideas. The patient is not nervous/anxious and is not hyperactive.   All other systems reviewed and are negative.       Objective:    BP 134/80   Pulse 76   Temp (!) 97.4 F (36.3 C) (Oral)   Ht 5\' 2"  (1.575 m)   Wt 181 lb (82.1 kg)   BMI 33.11 kg/m    Wt Readings from Last 3 Encounters:  03/03/18 181 lb (82.1 kg)  01/08/18 177 lb 3.2 oz (80.4 kg)  11/18/17 177 lb 12.8 oz (80.6 kg)    Physical Exam Vitals signs and nursing note reviewed.  Constitutional:      General: She is not in acute distress.    Appearance: Normal appearance. She is well-developed and well-groomed. She is not ill-appearing.  HENT:     Head: Normocephalic and atraumatic.  Eyes:     Conjunctiva/sclera: Conjunctivae normal.     Pupils: Pupils are equal, round, and reactive to light.  Cardiovascular:     Rate and Rhythm: Normal rate and regular rhythm.     Heart sounds: Normal heart sounds.  Pulmonary:     Effort: Pulmonary effort is normal.     Breath sounds: Normal breath sounds.  Skin:    General: Skin is warm and dry.     Capillary Refill: Capillary refill takes less than 2 seconds.  Neurological:     General: No focal deficit present.     Mental Status: She is alert and oriented to person, place, and time.  Psychiatric:        Mood and Affect: Mood is depressed.        Speech: Speech normal.        Behavior: Behavior normal. Behavior is cooperative.        Thought Content: Thought content normal. Thought content does not include homicidal or suicidal ideation. Thought content does not include homicidal or suicidal plan.        Cognition and Memory: Cognition and memory normal.        Judgment: Judgment normal.     Results for orders placed or  performed in visit on 01/08/18  CBC with Differential/Platelet  Result Value Ref Range   WBC 6.5 4.0 - 10.5 K/uL   RBC 4.81 3.87 - 5.11 MIL/uL   Hemoglobin 13.8 12.0 - 15.0 g/dL   HCT 43.3 36.0 - 46.0 %   MCV 90.0 80.0 - 100.0 fL   MCH 28.7 26.0 - 34.0 pg   MCHC 31.9 30.0 - 36.0  g/dL   RDW 12.7 11.5 - 15.5 %   Platelets 182 150 - 400 K/uL   nRBC 0.0 0.0 - 0.2 %   Neutrophils Relative % 53 %   Neutro Abs 3.5 1.7 - 7.7 K/uL   Lymphocytes Relative 36 %   Lymphs Abs 2.3 0.7 - 4.0 K/uL   Monocytes Relative 8 %   Monocytes Absolute 0.5 0.1 - 1.0 K/uL   Eosinophils Relative 2 %   Eosinophils Absolute 0.1 0.0 - 0.5 K/uL   Basophils Relative 1 %   Basophils Absolute 0.1 0.0 - 0.1 K/uL   Immature Granulocytes 0 %   Abs Immature Granulocytes 0.02 0.00 - 0.07 K/uL  Comprehensive metabolic panel  Result Value Ref Range   Sodium 137 135 - 145 mmol/L   Potassium 4.2 3.5 - 5.1 mmol/L   Chloride 104 98 - 111 mmol/L   CO2 26 22 - 32 mmol/L   Glucose, Bld 93 70 - 99 mg/dL   BUN 15 8 - 23 mg/dL   Creatinine, Ser 0.73 0.44 - 1.00 mg/dL   Calcium 9.6 8.9 - 10.3 mg/dL   Total Protein 7.6 6.5 - 8.1 g/dL   Albumin 3.9 3.5 - 5.0 g/dL   AST 43 (H) 15 - 41 U/L   ALT 55 (H) 0 - 44 U/L   Alkaline Phosphatase 27 (L) 38 - 126 U/L   Total Bilirubin 0.4 0.3 - 1.2 mg/dL   GFR calc non Af Amer >60 >60 mL/min   GFR calc Af Amer >60 >60 mL/min   Anion gap 7 5 - 15  Lactate dehydrogenase  Result Value Ref Range   LDH 167 98 - 192 U/L       Pertinent labs & imaging results that were available during my care of the patient were reviewed by me and considered in my medical decision making.  Assessment & Plan:  Chabely was seen today for depression.  Diagnoses and all orders for this visit:  Current mild episode of major depressive disorder without prior episode ([place]) Will start at 25 mg daily for 2 weeks and then increase to 50 mg daily. Report any new or worsening symptoms. -     sertraline  (ZOLOFT) 50 MG tablet; Take 1 tablet (50 mg total) by mouth daily. -     hydrOXYzine (ATARAX/VISTARIL) 25 MG tablet; Take 1 tablet (25 mg total) by mouth at bedtime as needed (insomnia).  Grief Will trial Atarax at night for insomnia due to depression and grief. Report any new or worsening symptoms.  -     sertraline (ZOLOFT) 50 MG tablet; Take 1 tablet (50 mg total) by mouth daily. -     hydrOXYzine (ATARAX/VISTARIL) 25 MG tablet; Take 1 tablet (25 mg total) by mouth at bedtime as needed (insomnia).     Continue all other maintenance medications.  Follow up plan: Return in about 4 weeks (around 03/31/2018), or if symptoms worsen or fail to improve, for depression reevaluation .  Educational handout given for depression   The above assessment and management plan was discussed with the patient. The patient verbalized understanding of and has agreed to the management plan. Patient is aware to call the clinic if symptoms persist or worsen. Patient is aware when to return to the clinic for a follow-up visit. Patient educated on when it is appropriate to go to the emergency department.   Monia Pouch, FNP-C [facility] [phone]

## 2018-03-10 ENCOUNTER — Telehealth: Payer: Self-pay | Admitting: Family

## 2018-03-12 ENCOUNTER — Telehealth (HOSPITAL_COMMUNITY): Payer: Self-pay | Admitting: Emergency Medicine

## 2018-03-12 NOTE — Telephone Encounter (Signed)
Patient called to inform us she can not afford her eliquis this month. Her part is roughly $200 and she can not pay that currently. Angie, Financial advisor made aware and states she will call the patient to get information to fill out a assistance application. Patient made aware of this and is awaiting return call.

## 2018-03-13 ENCOUNTER — Telehealth (HOSPITAL_COMMUNITY): Payer: Self-pay | Admitting: Hematology

## 2018-03-20 ENCOUNTER — Ambulatory Visit: Payer: Medicare HMO | Admitting: Family Medicine

## 2018-03-26 ENCOUNTER — Ambulatory Visit (INDEPENDENT_AMBULATORY_CARE_PROVIDER_SITE_OTHER): Payer: Medicare HMO | Admitting: Family

## 2018-03-26 ENCOUNTER — Encounter: Payer: Self-pay | Admitting: Family

## 2018-03-26 VITALS — BP 189/79 | HR 87 | Temp 96.5°F | Ht 62.0 in | Wt 182.0 lb

## 2018-03-26 DIAGNOSIS — J209 Acute bronchitis, unspecified: Secondary | ICD-10-CM | POA: Diagnosis not present

## 2018-03-26 DIAGNOSIS — Z86718 Personal history of other venous thrombosis and embolism: Secondary | ICD-10-CM | POA: Diagnosis not present

## 2018-03-26 MED ORDER — BENZONATATE 200 MG PO CAPS: 200.0000 mg | ORAL_CAPSULE | Freq: Three times a day (TID) | ORAL | 1 refills | Status: DC | PRN

## 2018-03-26 MED ORDER — PREDNISONE 10 MG (21) PO TBPK: ORAL_TABLET | ORAL | 0 refills | Status: DC

## 2018-03-26 NOTE — Progress Notes (Signed)
Subjective:    Patient ID: Christina Gonzales, female    DOB: [birth year], 73 y.o.   MRN: [id number]  Chief Complaint  Patient presents with  . Cough and chest congestion for two weeks    Cough  This is a new problem. The current episode started 1 to 4 weeks ago. The problem has been waxing and waning. The problem occurs every few minutes. The cough is non-productive. Associated symptoms include ear pain (about a week ago), nasal congestion, postnasal drip and a sore throat. Pertinent negatives include no chills, ear congestion, fever, headaches, myalgias, shortness of breath or wheezing. The symptoms are aggravated by lying down. She has tried rest and OTC cough suppressant for the symptoms. The treatment provided mild relief.      Review of Systems  Constitutional: Negative for chills and fever.  HENT: Positive for ear pain (about a week ago), postnasal drip and sore throat.   Respiratory: Positive for cough. Negative for shortness of breath and wheezing.   Musculoskeletal: Negative for myalgias.  Neurological: Negative for headaches.  All other systems reviewed and are negative.      Objective:   Physical Exam Vitals signs reviewed.  Constitutional:      General: She is not in acute distress.    Appearance: She is well-developed.  HENT:     Head: Normocephalic and atraumatic.     Right Ear: Tympanic membrane normal.     Left Ear: Tympanic membrane normal.     Nose: Congestion present.  Eyes:     Pupils: Pupils are equal, round, and reactive to light.  Neck:     Musculoskeletal: Normal range of motion and neck supple.     Thyroid: No thyromegaly.  Cardiovascular:     Rate and Rhythm: Normal rate and regular rhythm.     Heart sounds: Normal heart sounds. No murmur.  Pulmonary:     Effort: Pulmonary effort is normal. No respiratory distress.     Breath sounds: Normal breath sounds. No wheezing.     Comments: Intermittent nonproductive cough Abdominal:     General:  Bowel sounds are normal. There is no distension.     Palpations: Abdomen is soft.     Tenderness: There is no abdominal tenderness.  Musculoskeletal: Normal range of motion.        General: No tenderness.  Skin:    General: Skin is warm and dry.  Neurological:     Mental Status: She is alert and oriented to person, place, and time.     Cranial Nerves: No cranial nerve deficit.     Deep Tendon Reflexes: Reflexes are normal and symmetric.  Psychiatric:        Behavior: Behavior normal.        Thought Content: Thought content normal.        Judgment: Judgment normal.       BP (!) 189/79   Pulse 87   Temp (!) 96.5 F (35.8 C) (Oral)   Ht 5\' 2"  (1.575 m)   Wt 182 lb (82.6 kg)   BMI 33.29 kg/m      Assessment & Plan:  Christina Gonzales comes in today with chief complaint of Cough and chest congestion for two weeks   Diagnosis and orders addressed:  1. Acute bronchitis, unspecified organism - Take meds as prescribed - Use a cool mist humidifier  -Use saline nose sprays frequently -Force fluids -For any cough or congestion  Use plain Mucinex- regular strength or max strength  is fine -For fever or aces or pains- take tylenol or ibuprofen. -Throat lozenges if help -New toothbrush in 3 days RTO if symptoms worsen or do not improve  - predniSONE (STERAPRED UNI-PAK 21 TAB) 10 MG (21) TBPK tablet; Use as directed  Dispense: 21 tablet; Refill: 0 - benzonatate (TESSALON) 200 MG capsule; Take 1 capsule (200 mg total) by mouth 3 (three) times daily as needed.  Dispense: 30 capsule; Refill: Christina Airy, FNP

## 2018-03-26 NOTE — Patient Instructions (Signed)

## 2018-03-30 ENCOUNTER — Ambulatory Visit (INDEPENDENT_AMBULATORY_CARE_PROVIDER_SITE_OTHER): Payer: Medicare HMO | Admitting: Licensed Clinical Social Worker

## 2018-03-30 DIAGNOSIS — F320 Major depressive disorder, single episode, mild: Secondary | ICD-10-CM | POA: Diagnosis not present

## 2018-03-30 DIAGNOSIS — I10 Essential (primary) hypertension: Secondary | ICD-10-CM | POA: Diagnosis not present

## 2018-03-30 DIAGNOSIS — R69 Illness, unspecified: Secondary | ICD-10-CM | POA: Diagnosis not present

## 2018-03-30 DIAGNOSIS — F411 Generalized anxiety disorder: Secondary | ICD-10-CM

## 2018-03-30 DIAGNOSIS — F4321 Adjustment disorder with depressed mood: Secondary | ICD-10-CM

## 2018-03-30 NOTE — Chronic Care Management (AMB) (Addendum)
Chronic Care Management    Clinical Social Work General Note  03/30/2018 Name: Christina Gonzales MRN: [id number] DOB: [birth year]  Referred by: PCP, Christina Balloon, FNP for psychosocial assessment and possible grief counseling  [name] was given information about Chronic Care Management services today including:  1. CCM service includes personalized support from designated clinical staff supervised by her physician, including individualized plan of care and coordination with other care providers 2. 24/7 contact phone numbers for assistance for urgent and routine care needs. 3. Service will only be billed when office clinical staff spend 20 minutes or more in a month to coordinate care. 4. Only one practitioner may furnish and bill the service in a calendar month. 5. The patient may stop CCM services at any time (effective at the end of the month) by phone call to the office staff. 6. The patient will be responsible for cost sharing (co-pay) of up to 20% of the service fee (after annual deductible is met).  Patient agreed to services and verbal consent obtained.   Review of patient status, including review of consultants reports, relevant laboratory and other test results, and collaboration with appropriate care team members and the patient's provider was performed as part of comprehensive patient evaluation and provision of chronic care management services.    Last CCM Appointment: 03/30/2018  Depression screen [facility] 2/9 03/26/2018 Mar 29, 2018 11/18/2017  Decreased Interest 0 3 0  Down, Depressed, Hopeless 0 3 0  PHQ - 2 Score 0 6 0  Altered sleeping - 1 -  Tired, decreased energy - 0 -  Change in appetite - 0 -  Feeling bad or failure about yourself  - 0 -  Trouble concentrating - 1 -  Moving slowly or fidgety/restless - 0 -  Suicidal thoughts - 0 -  PHQ-9 Score - 8 -     GAD 7 : Generalized Anxiety Score 08/09/2015  Nervous, Anxious, on Edge 3  Control/stop worrying 1    Worry too much - different things 1  Trouble relaxing 2  Restless 1  Easily annoyed or irritable 0  Afraid - awful might happen 0  Total GAD 7 Score 8  Anxiety Difficulty Somewhat difficult     Current Barriers:  [name] Grief issues related to past death of her son. . Grief issues related to death of her brother 2018/03/29) . Anxiety issues  Goal: Patient Stated:  "I want to talk with someone about grief issues and managing grief symptoms."   Clinical Social Work Clinical Goal(s):  [name] Over the next 30  days, client will work with SW to address concerns related to grief issues of client and managing grief issues experienced by client  Interventions: . Provided patient with information about CCM program services . Collaborated with RN Case Manager re: patient medication cost issue . Patient interviewed and appropriate assessments performed . Provided counseling support for client  Patient Self Care Activities:  . Attends all scheduled provider appointments . Self administers medications as prescribed . Performs ADL's independently  . Calls provider office as needed to ask medical questions regarding client medical needs.  Plan:  . Patient will communicate with RN CM Christina Gonzales regarding medication cost issue of client . LCSW will call client in one week to further assess psychosocial needs of client . Patient will call LCSW as needed to discuss grief management of client  . Client will attend medical appointments as scheduled. [name] LCSW to talk further with client regarding relaxation techniques to  help client manage grief symptoms experienced  *initial goal documentation   Follow Up Plan: LCSW to call client in one week to further assess psychosocial needs of client.      Christina Gonzales.Christina Gonzales MSW, LCSW Licensed Clinical Social Worker [facility] Care [facility] [phone]  I have reviewed and agree with the above documentation.   Christina Dun,  FNP

## 2018-03-30 NOTE — Patient Instructions (Signed)
Licensed Clinical Social Worker Visit Information  Materials Provided:  No  Christina Gonzales was given information about Chronic Care Management services today including:  1. CCM service includes personalized support from designated clinical staff supervised by her physician, including individualized plan of care and coordination with other care providers 2. 24/7 contact phone numbers for assistance for urgent and routine care needs. 3. Service will only be billed when office clinical staff spend 20 minutes or more in a month to coordinate care. 4. Only one practitioner may furnish and bill the service in a calendar month. 5. The patient may stop CCM services at any time (effective at the end of the month) by phone call to the office staff. 6. The patient will be responsible for cost sharing (co-pay) of up to 20% of the service fee (after annual deductible is met).  Patient agreed to services and verbal consent obtained.    Goals Discussed Today:      Current Barriers:   Grief issues related to past death of her son.  Grief issues related to death of her brother 27-Mar-2018)  Anxiety issues  Goal: Patient Stated:  "I want to talk with someone about grief issues and managing grief symptoms."   Clinical Social Work Clinical Goal(s):   Over the next 30  days, client will work with SW to address concerns related to grief issues of client and managing grief issues experienced by client  Interventions:  Provided patient with information about CCM program services  Collaborated with RN Case Manager re: patient medication cost issue  Patient interviewed and appropriate assessments performed  Provided counseling support for client  Patient Self Care Activities:   Attends all scheduled provider appointments  Self administers medications as prescribed  Performs ADL's independently   Calls provider office as needed to ask medical questions regarding client medical  needs.  Plan:   Patient will communicate with RN CM [name] regarding medication cost issue of client  LCSW will call client in one week to further assess psychosocial needs of client  Patient will call LCSW as needed to discuss grief management of client   Client will attend medical appointments as scheduled.  LCSW to talk further with client regarding relaxation techniques to help client manage grief symptoms experienced  *initial goal documentation  Follow Up Plan: LCSW to call client in one week to further assess psychosocial needs of client.      The patient verbalized understanding of instructions provided today and declined a print copy of patient instruction materials.   Christina Gonzales MSW, LCSW Licensed Clinical Social Worker [facility] Care [facility] [phone]

## 2018-04-02 ENCOUNTER — Ambulatory Visit: Payer: Medicare HMO | Admitting: Nurse Practitioner

## 2018-04-06 ENCOUNTER — Ambulatory Visit (INDEPENDENT_AMBULATORY_CARE_PROVIDER_SITE_OTHER): Payer: Medicare HMO | Admitting: Licensed Clinical Social Worker

## 2018-04-06 DIAGNOSIS — R69 Illness, unspecified: Secondary | ICD-10-CM | POA: Diagnosis not present

## 2018-04-06 DIAGNOSIS — F4321 Adjustment disorder with depressed mood: Secondary | ICD-10-CM

## 2018-04-06 DIAGNOSIS — F320 Major depressive disorder, single episode, mild: Secondary | ICD-10-CM | POA: Diagnosis not present

## 2018-04-06 DIAGNOSIS — I10 Essential (primary) hypertension: Secondary | ICD-10-CM | POA: Diagnosis not present

## 2018-04-06 DIAGNOSIS — F411 Generalized anxiety disorder: Secondary | ICD-10-CM

## 2018-04-06 NOTE — Chronic Care Management (AMB) (Addendum)
Chronic Care Management    Clinical Social Work General Follow Up Note  04/06/2018 Name: Christina Gonzales MRN: [id number] DOB: [birth year]  Christina Gonzales is a 72 y.o. year old female who is a primary care patient of Sharion Balloon, FNP.  Sharion Balloon, FNP asked the CCM team to consult the patient for assistance with psychosocial assessment and possible grief counseling support.   Review of patient status, including review of consultants reports, relevant laboratory and other test results, and collaboration with appropriate care team members and the patient's provider was performed as part of comprehensive patient evaluation and provision of chronic care management services.    Depression screen [facility] 2/9 04/06/2018 03/26/2018 03/03/2018 11/18/2017 07/30/2017  Decreased Interest 1 0 3 0 0  Down, Depressed, Hopeless 1 0 3 0 0  PHQ - 2 Score 2 0 6 0 0  Altered sleeping 1 - 1 - -  Tired, decreased energy 0 - 0 - -  Change in appetite 0 - 0 - -  Feeling bad or failure about yourself  0 - 0 - -  Trouble concentrating 1 - 1 - -  Moving slowly or fidgety/restless 0 - 0 - -  Suicidal thoughts 0 - 0 - -  PHQ-9 Score 4 - 8 - -  Difficult doing work/chores Somewhat difficult - - - -    GAD 7 : Generalized Anxiety Score 08/09/2015  Nervous, Anxious, on Edge 3  Control/stop worrying 1  Worry too much - different things 1  Trouble relaxing 2  Restless 1  Easily annoyed or irritable 0  Afraid - awful might happen 0  Total GAD 7 Score 8  Anxiety Difficulty Somewhat difficult     Related to Social Determinants of Health: Client has ongoing grief issues. She experienced the death of her brother in 2018/03/03. She also experienced the death of her son a number of years ago.  She said she had Zoloft prescription filled but has not been taking Zoloft as prescribed.  She said she cries occasionally when thinking of her brother.    Current Barriers:   Grief issues related to past death of  her son.  Grief issues related to death of her brother 2018/03/03)  Anxiety issues  Goal: Patient Stated:  "I want to talk with someone about grief issues and managing grief symptoms."   Clinical Social Work Clinical Goal(s):   Over the next 30  days, client will work with SW to address concerns related to grief issues of client and managing grief issues experienced by client  Interventions:  Provided patient with information about CCM program services  Previously collaborated with RN Case Freight forwarder re: patient medication cost issue  Patient interviewed and appropriate assessments performed  Provided counseling support for client  Patient Self Care Activities:   Attends all scheduled provider appointments  Self administers medications as prescribed  Performs ADL's independently   Calls provider office as needed to ask medical questions regarding client medical needs.  Plan:   Patient will communicate with RN CM [name] regarding medication cost issue of client  LCSW will call client in three weeks to further assess psychosocial needs of client  Patient will call LCSW as needed to discuss grief management of client   Client will attend medical appointments as scheduled.  LCSW to talk further with client regarding relaxation techniques to help client manage grief symptoms experienced  *initial goal documentation  Follow Up Plan: LCSW to call client  in three weeks to further assess psychosocial needs of client.      [name].Kein Carlberg MSW, LCSW Licensed Clinical Social Worker [facility] [phone]             I have reviewed and agree with the above documentation.   [name], FNP

## 2018-04-06 NOTE — Patient Instructions (Addendum)
Licensed Clinical Water engineer Provided: No  Goals we discussed today:   Current Barriers:  Grief issues related topastdeath of her son.  Grief issues related to death of her brother Mar 30, 2018)  Anxiety issues  Goal: Patient Stated: "I want to talk with someone about grief issues and managing grief symptoms."  Clinical Social Work Clinical Goal(s):  Over the next30days, client will work with SW to address concerns related to grief issues of client and managing grief issues experienced by client  Interventions:  Provided patient with information aboutCCM program services  Previously collaborated with RN Case Manager LI:DCVUDTH medication cost issue  Patient interviewed and appropriate assessments performed  Provided counseling support for client  Patient Self Care Activities:  Attends all scheduled provider appointments  Self administers medications as prescribed  Performs ADL's independently  Calls provider office as needed to ask medical questions regarding client medical needs.  Plan:  Patient willcommunicate with RN CM [name] regarding medication cost issue of client  LCSW willcall client in three weeks to further assess psychosocial needs of client  Patient willcall LCSW as needed to discuss grief management of client  Client will attend medical appointments as scheduled.  LCSW to talk further with client regarding relaxation techniques to help client manage grief symptoms experienced  *initial goal documentation  Follow Up Plan:LCSW to call client in three weeks to further assess psychosocial needs of client.  The patient verbalized understanding of instructions provided today and declined a print copy of patient instruction materials.   [name].Dimitrius Steedman MSW, LCSW Licensed Clinical Social Worker [facility] [phone]

## 2018-04-24 ENCOUNTER — Ambulatory Visit: Payer: Self-pay | Admitting: Licensed Clinical Social Worker

## 2018-04-24 DIAGNOSIS — F4321 Adjustment disorder with depressed mood: Secondary | ICD-10-CM

## 2018-04-24 DIAGNOSIS — F320 Major depressive disorder, single episode, mild: Secondary | ICD-10-CM

## 2018-04-24 DIAGNOSIS — F411 Generalized anxiety disorder: Secondary | ICD-10-CM

## 2018-04-24 DIAGNOSIS — I10 Essential (primary) hypertension: Secondary | ICD-10-CM

## 2018-04-24 DIAGNOSIS — Z86718 Personal history of other venous thrombosis and embolism: Secondary | ICD-10-CM

## 2018-04-24 NOTE — Patient Instructions (Signed)
Licensed Clinical Social Worker Visit Information  Goals we discussed today:  Goals Addressed            This Visit's Progress   . Client stated:  " I want to talk with someone about grief issues and managing grief symptoms" (pt-stated)       Current Barriers:  [name] Grief issues related to past death of her son . Grief issues related to death of her brother Mar 09, 2018)  Clinical Social Work Clinical Goal(s):  [name] Over the next 30 days, client will work with LCSW to address concerns related to grief issues of client and managing grief issues experienced by client.   Interventions:   Provided patient with information about CCM program services  Previously talked with RN CM regarding patient medication cost issues Provided counseling support for client  Patient Self Care Activities:  . Self administers medications as prescribed . Attends all scheduled provider appointments . Performs ADL's independently  . Calls provider office as needed to ask medical questions regarding client medical needs.  Plan:  Patient will talk  with RN CM [name] regarding medication cost issue of client LCSW will call client in 3 weeks to further assess psychosocial needs of client. Patient will call LCSW as needed to discuss grief management of client Client will attend medical appointments as scheduled LCSW to talk further with client about relaxation techniques to help client manage grief symptoms experienced.(taking walks, listening to music, doing puzzles)  Initial goal documentation    Materials Provided: No  Follow Up Plan: LCSW will call client in 3 weeks to further assess psychosocial needs of client  The patient verbalized understanding of instructions provided today and declined a print copy of patient instruction materials.   Christina Gonzales.Christina Gonzales MSW, LCSW Licensed Clinical Social Worker [facility] [phone]

## 2018-04-24 NOTE — Chronic Care Management (AMB) (Addendum)
  Care Management Note   Christina Gonzales is a 73 y.o. year old female who is a primary care patient of Christina Balloon, FNP. The CM team was consulted for assistance with chronic disease management and care coordination.   I reached out to Christina Gonzales by phone today.   Review of patient status, including review of consultants reports, relevant laboratory and other test results, and collaboration with appropriate care team members and the patient's provider was performed as part of comprehensive patient evaluation and provision of chronic care management services.   Social Determinants of Health. Client has ongoing grief issus. She experienced the death of her brother in 03-23-2018. She also experienced the death of her son a number of years ago. She said she had Zoloft prescription filled but has not been taking Zoloft as prescribed. She said she cries occasionally when thinking of her brother.   Goals Addressed            This Visit's Progress   . Client stated:  " I want to talk with someone about grief issues and managing grief symptoms" (pt-stated)       Current Barriers:  [name] Grief issues related to past death of her son . Grief issues related to death of her brother March 23, 2018)  Clinical Social Work Clinical Goal(s):  [name] Over the next 30 days, client will work with LCSW to address concerns related to grief issues of client and managing grief issues experienced by client.   Interventions:   Provided patient with information about CCM program services  Previously talked with RN CM regarding patient medication cost issues Provided counseling support for client Talked with client about Senior Games participation at local recreational center Encouraged client to socialize with friends and family to help her manage grief symptoms  Patient Self Care Activities:  . Self administers medications as prescribed . Attends all scheduled provider appointments .  Performs ADL's independently  . Calls provider office as needed to ask medical questions regarding client medical needs.  Plan:  Patient will talk with RN CM Christina Gonzales regarding medication cost issue of client LCSW will call client in 3 weeks to further assess psychosocial needs of client. Patient will call LCSW as needed to discuss grief management of client Client will attend medical appointments as scheduled LCSW to talk further with client about relaxation techniques to help client manage grief symptoms experienced.( taking walks, listening to music, doing puzzles)  Initial goal documentation    Follow Up Plan: LCSW to call client in next 3 weeks to assess psychosocial needs of client.   Christina Gonzales.Christina Gonzales MSW, LCSW Licensed Clinical Social Worker [facility] [phone]  I have reviewed and agree with the above  documentation.   Christina Dun, FNP

## 2018-05-08 ENCOUNTER — Ambulatory Visit: Payer: Medicare HMO | Admitting: *Deleted

## 2018-05-11 ENCOUNTER — Ambulatory Visit (INDEPENDENT_AMBULATORY_CARE_PROVIDER_SITE_OTHER): Payer: Medicare HMO | Admitting: Licensed Clinical Social Worker

## 2018-05-11 DIAGNOSIS — F320 Major depressive disorder, single episode, mild: Secondary | ICD-10-CM | POA: Diagnosis not present

## 2018-05-11 DIAGNOSIS — F411 Generalized anxiety disorder: Secondary | ICD-10-CM

## 2018-05-11 DIAGNOSIS — I10 Essential (primary) hypertension: Secondary | ICD-10-CM | POA: Diagnosis not present

## 2018-05-11 DIAGNOSIS — R69 Illness, unspecified: Secondary | ICD-10-CM | POA: Diagnosis not present

## 2018-05-11 DIAGNOSIS — F4321 Adjustment disorder with depressed mood: Secondary | ICD-10-CM

## 2018-05-11 DIAGNOSIS — Z86718 Personal history of other venous thrombosis and embolism: Secondary | ICD-10-CM

## 2018-05-11 NOTE — Chronic Care Management (AMB) (Addendum)
  Care Management Note   Christina Gonzales is a 73 y.o. year old female who is a primary care patient of Sharion Balloon, FNP. The CM team was consulted for assistance with chronic disease management and care coordination.   Review of patient status, including review of consultants reports, relevant laboratory and other test results, and collaboration with appropriate care team members and the patient's provider was performed as part of comprehensive patient evaluation and provision of chronic care management services.   Social Determinants of Health: At Risk for Depression; Experiencing grief issues over recent death of her brother 03-25-2018) and over the past death of her son   Chronic Care Management from 04/06/2018 in [place]  PHQ-9 Total Score  4      GAD 7 : Generalized Anxiety Score 08/09/2015  Nervous, Anxious, on Edge 3  Control/stop worrying 1  Worry too much - different things 1  Trouble relaxing 2  Restless 1  Easily annoyed or irritable 0  Afraid - awful might happen 0  Total GAD 7 Score 8  Anxiety Difficulty Somewhat difficult   Goals Addressed            This Visit's Progress   . Client stated:  " I want to talk with someone about grief issues and managing grief symptoms" (pt-stated)       Current Barriers:  [name] Grief issues related to past death of her son . Grief issues related to death of her brother 03/25/2018)  Clinical Social Work Clinical Goal(s):  [name] Over the next 30 days, client will work with LCSW to address concerns related to grief issues of client and managing grief issues experienced by client.   Interventions:   Previously talked with RN CM regarding medication cost issue for client Talked with client about use of relaxation techniques (listen to music, read Bible, word puzzles, talk with family or friends via phone) to help in managing grief symptoms Talked with client about self care activities Provided  counseling support for client  Patient Self Care Activities:  . Self administers medications as prescribed . Attends all scheduled provider appointments . Performs ADL's independently  . Calls provider office as needed to ask medical questions regarding client medical needs.  Plan:  Patient will talk  with RN CM [name] regarding medication cost issue of client LCSW will call client in 3 weeks to further assess psychosocial needs of client. Patient will call LCSW as needed to discuss grief management of client Client will attend medical appointments as scheduled LCSW to talk further with client about relaxation techniques to help client manage grief symptoms experienced.  Initial goal documentation  LCSW collaborated with RN CM [name] on 05/11/2018 regarding client questions about cost of prescribed Eliquis.  Client also had previously informed LCSW that client did not take Zoloft as prescribed. Client had informed LCSW that client had prescribed Zoloft but was not taking Zoloft as prescribed. LCSW shared this information regarding client use of Zoloft with [name] RN on 05/11/2018  Follow Up Plan: LCSW to call client in next 3 weeks to talk with client about management of grief issues experienced by client  [name].Dyna Figuereo MSW, LCSW Licensed Clinical Social Worker [facility] Care Management [phone]  I have reviewed and agree with the above documentation.   [name], FNP

## 2018-05-11 NOTE — Patient Instructions (Signed)
Licensed Clinical Social Worker Visit Information  Goals we discussed today:  Goals Addressed            This Visit's Progress   . Client stated:  " I want to talk with someone about grief issues and managing grief symptoms" (pt-stated)       Current Barriers:  [name] Grief issues related to past death of her son . Grief issues related to death of her brother 03/18/18)  Clinical Social Work Clinical Goal(s):  [name] Over the next 30 days, client will work with LCSW to address concerns related to grief issues of client and managing grief issues experienced by client.   Interventions:   Provided patient with information about CCM program services  Previously talked with RN CM regarding patient medication cost issues Provided counseling support for client  Patient Self Care Activities:  . Self administers medications as prescribed . Attends all scheduled provider appointments . Performs ADL's independently  . Calls provider office as needed to ask medical questions regarding client medical needs.  Plan:  Patient will talk  with RN CM [name] regarding medication cost issue of client LCSW will call client in 3 weeks to further assess psychosocial needs of client. Patient will call LCSW as needed to discuss grief management of client Client will attend medical appointments as scheduled LCSW to talk further with client about relaxation techniques to help client manage grief symptoms experienced.  Initial goal documentation    Materials Provided: No  Follow Up Plan: LCSW will call client in 3 weeks to talk with client about management of grief issues experienced by client.  The patient verbalized understanding of instructions provided today and declined a print copy of patient instruction materials.   [name].Christina Gonzales MSW, LCSW Licensed Clinical Social Worker [facility] Care Management [phone]

## 2018-05-15 ENCOUNTER — Telehealth: Payer: Self-pay

## 2018-05-20 NOTE — Chronic Care Management (AMB) (Addendum)
  Chronic Care Management   RN Case Management Note  05/08/2018 Name: Christina Gonzales MRN: [id number] DOB: 1945/09/10  Christina Gonzales has been talking with Legrand Como "Scott" Forrest, LCSW with the [facility] CCM Team regarding her psychosocial needs and he recommended that she speak with me regarding nursing care needs.   I spoke with Christina Gonzales via telephone. She is having difficulty with cost of Eliquis and has been told that she has to meet her deductible before there's any coverage. She wanted to buy half of the prescription but it was around $260 for 15 day supply. She has a couple of weeks of samples left but our office will not be getting any samples in the near future because of the [place] restrictions on visitors to the office.   Goals Addressed    . "I need help paying for my Eliquis" (pt-stated)       Current Barriers:  . Film/video editor.   Nurse Case Manager Clinical Goal(s):  [name] Over the next 30 days, patient will work with CM team pharmacist to apply for patient assistance.  Interventions:  . Reviewed medications with patient and discussed cost of Eliquis. . Discussed plans with patient for ongoing care management follow up and provided patient with direct contact information for care management team  Patient Self Care Activities:  . Self administers medications as prescribed . Calls pharmacy for medication refills . Attends church or other social activities . Performs ADL's independently . Performs IADL's independently . Calls provider office for new concerns or questions       Follow up plan: The CM team will reach out to the patient again over the next 14 days.    [name], RN-BC, BSN Nurse Case Manager [facility] [phone]    I have reviewed and agree with the above  documentation.   [name], FNP

## 2018-06-01 ENCOUNTER — Ambulatory Visit: Payer: Self-pay | Admitting: Licensed Clinical Social Worker

## 2018-06-01 DIAGNOSIS — I10 Essential (primary) hypertension: Secondary | ICD-10-CM

## 2018-06-01 DIAGNOSIS — Z86718 Personal history of other venous thrombosis and embolism: Secondary | ICD-10-CM

## 2018-06-01 DIAGNOSIS — F411 Generalized anxiety disorder: Secondary | ICD-10-CM

## 2018-06-01 DIAGNOSIS — F320 Major depressive disorder, single episode, mild: Secondary | ICD-10-CM

## 2018-06-01 DIAGNOSIS — F4321 Adjustment disorder with depressed mood: Secondary | ICD-10-CM

## 2018-06-01 NOTE — Chronic Care Management (AMB) (Addendum)
  Care Management Note   Christina Gonzales is a 73 y.o. year old female who is a primary care patient of Sharion Balloon, FNP. The CM team was consulted for assistance with chronic disease management and care coordination.   I reached out to Christina Gonzales by phone today.   Review of patient status, including review of consultants reports, relevant laboratory and other test results, and collaboration with appropriate care team members and the patient's provider was performed as part of comprehensive patient evaluation and provision of chronic care management services.   Social Determinants of Health:Risk for Depression    Chronic Care Management from 04/06/2018 in [place]  PHQ-9 Total Score  4     GAD 7 : Generalized Anxiety Score 08/09/2015  Nervous, Anxious, on Edge 3  Control/stop worrying 1  Worry too much - different things 1  Trouble relaxing 2  Restless 1  Easily annoyed or irritable 0  Afraid - awful might happen 0  Total GAD 7 Score 8  Anxiety Difficulty Somewhat difficult   Goals Addressed            This Visit's Progress   . Client stated:  " I want to talk with someone about grief issues and managing grief symptoms" (pt-stated)       Current Barriers:  [name] Grief issues related to past death of her son . Grief issues related to death of her brother 03-27-18)  Clinical Social Work Clinical Goal(s):  [name] Over the next 30 days, client will work with LCSW to address concerns related to grief issues of client and managing grief issues experienced by client.   Interventions:   Provided patient with information about CCM program services  Previously talked with RN CM regarding patient medication cost issues Provided counseling support for client Talked with client about cost of her medications monthly Talked with client about social network/familiy support. She said she talks with her sister in law periodically, and with her  nephew) Talked with client about her use of relaxation techniques to help manage symptoms faced  Patient Self Care Activities:  . Self administers medications as prescribed . Attends all scheduled provider appointments . Performs ADL's independently  . Calls provider office as needed to ask medical questions regarding client medical needs.  Plan:  Patient will talk, as needed, with RN CM [name] regarding medication cost issue of client LCSW will call client in next 3 weeks to further assess psychosocial needs of client. Patient will call LCSW as needed to discuss grief management of client Client will attend medical appointments as scheduled LCSW to talk further with client about relaxation techniques to help client manage grief symptoms experienced.  Initial goal documentation    Follow Up Plan: LCSW will call client in next 3 weeks to further assess the psychosocial needs of client.  Christina Gonzales MSW, LCSW Licensed Clinical Social Worker [facility] Care Management [phone]  I have reviewed and agree with the above AWV documentation.   [name], FNP

## 2018-06-01 NOTE — Patient Instructions (Signed)
Licensed Clinical Social Worker Visit Information  Goals we discussed today:  Goals Addressed            This Visit's Progress   . Client stated:  " I want to talk with someone about grief issues and managing grief symptoms" (pt-stated)       Current Barriers:  [name] Grief issues related to past death of her son . Grief issues related to death of her brother Mar 11, 2018)  Clinical Social Work Clinical Goal(s):  [name] Over the next 30 days, client will work with LCSW to address concerns related to grief issues of client and managing grief issues experienced by client.   Interventions:   Provided patient with information about CCM program services  Previously talked with RN CM regarding patient medication cost issues Provided counseling support for client Talked with client about family support network (she said she calls her sister in law regularly and alsp speaks via phone with her nephew) Talked with client about her use of relaxation techniques to manage symptoms faced (listens to music, takes a walk, talks via phone with relatives)  Patient Self Care Activities:  . Self administers medications as prescribed . Attends all scheduled provider appointments . Performs ADL's independently  . Calls provider office as needed to ask medical questions regarding client medical needs.  Plan:  Patient will talk  with RN CM [name] regarding medication cost issue of client LCSW will call client in next 3 weeks to further assess psychosocial needs of client. Patient will call LCSW as needed to discuss grief management of client Client will attend medical appointments as scheduled LCSW to talk further with client about relaxation techniques to help client manage grief symptoms experienced.  Initial goal documentation     Materials Provided: No  Follow Up Plan: LCSW will call client in next 3 weeks to further assess psychosocial needs of client  The patient verbalized understanding  of instructions provided today and declined a print copy of patient instruction materials.   [name].Kathalene Sporer MSW, LCSW Licensed Clinical Social Worker [facility] Care Management [phone]

## 2018-06-05 ENCOUNTER — Telehealth: Payer: Medicare HMO | Admitting: *Deleted

## 2018-06-10 ENCOUNTER — Telehealth: Payer: Medicare HMO | Admitting: *Deleted

## 2018-06-10 ENCOUNTER — Other Ambulatory Visit: Payer: Self-pay

## 2018-06-11 ENCOUNTER — Ambulatory Visit: Payer: Medicare HMO | Admitting: *Deleted

## 2018-06-11 ENCOUNTER — Other Ambulatory Visit: Payer: Self-pay

## 2018-06-11 ENCOUNTER — Ambulatory Visit (INDEPENDENT_AMBULATORY_CARE_PROVIDER_SITE_OTHER): Payer: Medicare HMO | Admitting: Family

## 2018-06-11 ENCOUNTER — Encounter: Payer: Self-pay | Admitting: Family

## 2018-06-11 VITALS — BP 160/73 | HR 82 | Temp 98.6°F | Ht 62.0 in | Wt 183.8 lb

## 2018-06-11 DIAGNOSIS — F411 Generalized anxiety disorder: Secondary | ICD-10-CM

## 2018-06-11 DIAGNOSIS — F4321 Adjustment disorder with depressed mood: Secondary | ICD-10-CM | POA: Diagnosis not present

## 2018-06-11 DIAGNOSIS — F320 Major depressive disorder, single episode, mild: Secondary | ICD-10-CM

## 2018-06-11 DIAGNOSIS — R69 Illness, unspecified: Secondary | ICD-10-CM | POA: Diagnosis not present

## 2018-06-11 DIAGNOSIS — Z86718 Personal history of other venous thrombosis and embolism: Secondary | ICD-10-CM

## 2018-06-11 DIAGNOSIS — E785 Hyperlipidemia, unspecified: Secondary | ICD-10-CM

## 2018-06-11 DIAGNOSIS — E669 Obesity, unspecified: Secondary | ICD-10-CM

## 2018-06-11 DIAGNOSIS — I10 Essential (primary) hypertension: Secondary | ICD-10-CM

## 2018-06-11 MED ORDER — SERTRALINE HCL 50 MG PO TABS: 50.0000 mg | ORAL_TABLET | Freq: Every day | ORAL | 3 refills | Status: DC

## 2018-06-11 MED ORDER — LISINOPRIL 40 MG PO TABS: 40.0000 mg | ORAL_TABLET | Freq: Every day | ORAL | 3 refills | Status: DC

## 2018-06-11 NOTE — Progress Notes (Signed)
Subjective:    Patient ID: Christina Gonzales, female    DOB: 1945/11/13, 73 y.o.   MRN: [id number]  Chief Complaint  Patient presents with   Medical Management of Chronic Issues    Hypertension  This is a chronic problem. The current episode started more than 1 year ago. The problem has been waxing and waning since onset. The problem is uncontrolled. Associated symptoms include anxiety. Pertinent negatives include no malaise/fatigue, peripheral edema or shortness of breath. Risk factors for coronary artery disease include dyslipidemia, obesity and sedentary lifestyle. Past treatments include ACE inhibitors. There is no history of CAD/MI or heart failure.  Hyperlipidemia  This is a chronic problem. The current episode started more than 1 year ago. The problem is uncontrolled. Recent lipid tests were reviewed and are high. Exacerbating diseases include obesity. Pertinent negatives include no shortness of breath. She is currently on no antihyperlipidemic treatment. The current treatment provides no improvement of lipids. Risk factors for coronary artery disease include dyslipidemia, female sex, hypertension, a sedentary lifestyle and post-menopausal.  Anxiety  Presents for follow-up visit. Symptoms include decreased concentration, depressed mood, excessive worry, irritability, nervous/anxious behavior and restlessness. Patient reports no shortness of breath. Symptoms occur most days. The severity of symptoms is moderate.    Depression         This is a chronic problem.  The current episode started more than 1 year ago.   The onset quality is gradual.   The problem occurs intermittently.  The problem has been waxing and waning since onset.  Associated symptoms include decreased concentration, irritable, restlessness and sad.  Associated symptoms include no helplessness and no hopelessness.  Past treatments include nothing.  Compliance with treatment is good.  Past medical history includes anxiety.    HX DVT Pt has had 2 DVT's. Taking Eliquis daily.     Review of Systems  Constitutional: Positive for irritability. Negative for malaise/fatigue.  Respiratory: Negative for shortness of breath.   Psychiatric/Behavioral: Positive for decreased concentration and depression. The patient is nervous/anxious.   All other systems reviewed and are negative.      Objective:   Physical Exam Vitals signs reviewed.  Constitutional:      General: She is irritable. She is not in acute distress.    Appearance: She is well-developed.  HENT:     Head: Normocephalic and atraumatic.     Right Ear: Tympanic membrane normal.     Left Ear: Tympanic membrane normal.  Eyes:     Pupils: Pupils are equal, round, and reactive to light.  Neck:     Musculoskeletal: Normal range of motion and neck supple.     Thyroid: No thyromegaly.  Cardiovascular:     Rate and Rhythm: Normal rate and regular rhythm.     Heart sounds: Normal heart sounds. No murmur.  Pulmonary:     Effort: Pulmonary effort is normal. No respiratory distress.     Breath sounds: Normal breath sounds. No wheezing.  Abdominal:     General: Bowel sounds are normal. There is no distension.     Palpations: Abdomen is soft.     Tenderness: There is no abdominal tenderness.  Musculoskeletal: Normal range of motion.        General: No tenderness.  Skin:    General: Skin is warm and dry.  Neurological:     Mental Status: She is alert and oriented to person, place, and time.     Cranial Nerves: No cranial nerve deficit.  Deep Tendon Reflexes: Reflexes are normal and symmetric.  Psychiatric:        Mood and Affect: Mood is anxious.        Behavior: Behavior normal.        Thought Content: Thought content normal.        Judgment: Judgment normal.       BP (!) 160/73    Pulse 82    Temp 98.6 F (37 C) (Oral)    Ht '5\' 2"'  (1.575 m)    Wt 183 lb 12.8 oz (83.4 kg)    BMI 33.62 kg/m      Assessment & Plan:  JANICA ELDRED  comes in today with chief complaint of Medical Management of Chronic Issues   Diagnosis and orders addressed:  1. Current mild episode of major depressive disorder without prior episode ([place]) Will restart Zoloft today Stress management discussed - CMP14+EGFR - CBC with Differential/Platelet - sertraline (ZOLOFT) 50 MG tablet; Take 1 tablet (50 mg total) by mouth daily.  Dispense: 30 tablet; Refill: 3  2. Essential hypertension, benign Will increase Lisinopril to 40 mg from 20 mg Dash diet discussed - lisinopril (ZESTRIL) 40 MG tablet; Take 1 tablet (40 mg total) by mouth daily.  Dispense: 90 tablet; Refill: 3 - CMP14+EGFR - CBC with Differential/Platelet  3. GAD (generalized anxiety disorder) - CMP14+EGFR - CBC with Differential/Platelet - sertraline (ZOLOFT) 50 MG tablet; Take 1 tablet (50 mg total) by mouth daily.  Dispense: 30 tablet; Refill: 3  4. Hyperlipidemia, unspecified hyperlipidemia type - CMP14+EGFR - CBC with Differential/Platelet  5. Obesity (BMI 30-39.9) - CMP14+EGFR - CBC with Differential/Platelet  6. History of deep vein thrombosis (DVT) of lower extremity - CMP14+EGFR - CBC with Differential/Platelet  7. Grief - CMP14+EGFR - CBC with Differential/Platelet - sertraline (ZOLOFT) 50 MG tablet; Take 1 tablet (50 mg total) by mouth daily.  Dispense: 30 tablet; Refill: 3   Labs pending Health Maintenance reviewed Diet and exercise encouraged  Follow up plan: 2 weeks to recheck HTN  [name], FNP

## 2018-06-11 NOTE — Patient Instructions (Signed)

## 2018-06-12 LAB — CMP14+EGFR
ALT: 62 IU/L — ABNORMAL HIGH (ref 0–32)
AST: 41 IU/L — ABNORMAL HIGH (ref 0–40)
Albumin/Globulin Ratio: 1.5 (ref 1.2–2.2)
Albumin: 4.3 g/dL (ref 3.7–4.7)
Alkaline Phosphatase: 31 IU/L — ABNORMAL LOW (ref 39–117)
BUN/Creatinine Ratio: 26 (ref 12–28)
BUN: 22 mg/dL (ref 8–27)
Bilirubin Total: 0.3 mg/dL (ref 0.0–1.2)
CO2: 23 mmol/L (ref 20–29)
Calcium: 9.7 mg/dL (ref 8.7–10.3)
Chloride: 104 mmol/L (ref 96–106)
Creatinine, Ser: 0.86 mg/dL (ref 0.57–1.00)
GFR calc Af Amer: 78 mL/min/{1.73_m2} (ref >59)
GFR calc non Af Amer: 67 mL/min/{1.73_m2} (ref >59)
Globulin, Total: 2.8 g/dL (ref 1.5–4.5)
Glucose: 103 mg/dL — ABNORMAL HIGH (ref 65–99)
Potassium: 5 mmol/L (ref 3.5–5.2)
Sodium: 142 mmol/L (ref 134–144)
Total Protein: 7.1 g/dL (ref 6.0–8.5)

## 2018-06-12 LAB — CBC WITH DIFFERENTIAL/PLATELET
Basophils Absolute: 0 10*3/uL (ref 0.0–0.2)
Basos: 1 %
EOS (ABSOLUTE): 0.2 10*3/uL (ref 0.0–0.4)
Eos: 2 %
Hematocrit: 41.3 % (ref 34.0–46.6)
Hemoglobin: 13.9 g/dL (ref 11.1–15.9)
Immature Grans (Abs): 0 10*3/uL (ref 0.0–0.1)
Immature Granulocytes: 0 %
Lymphocytes Absolute: 3.1 10*3/uL (ref 0.7–3.1)
Lymphs: 40 %
MCH: 29.8 pg (ref 26.6–33.0)
MCHC: 33.7 g/dL (ref 31.5–35.7)
MCV: 89 fL (ref 79–97)
Monocytes Absolute: 0.6 10*3/uL (ref 0.1–0.9)
Monocytes: 8 %
Neutrophils Absolute: 3.9 10*3/uL (ref 1.4–7.0)
Neutrophils: 49 %
Platelets: 201 10*3/uL (ref 150–450)
RBC: 4.66 x10E6/uL (ref 3.77–5.28)
RDW: 13.1 % (ref 11.7–15.4)
WBC: 7.9 10*3/uL (ref 3.4–10.8)

## 2018-06-22 ENCOUNTER — Ambulatory Visit (INDEPENDENT_AMBULATORY_CARE_PROVIDER_SITE_OTHER): Payer: Medicare HMO | Admitting: Licensed Clinical Social Worker

## 2018-06-22 DIAGNOSIS — F4321 Adjustment disorder with depressed mood: Secondary | ICD-10-CM

## 2018-06-22 DIAGNOSIS — F320 Major depressive disorder, single episode, mild: Secondary | ICD-10-CM

## 2018-06-22 DIAGNOSIS — Z86718 Personal history of other venous thrombosis and embolism: Secondary | ICD-10-CM

## 2018-06-22 DIAGNOSIS — I10 Essential (primary) hypertension: Secondary | ICD-10-CM

## 2018-06-22 DIAGNOSIS — E785 Hyperlipidemia, unspecified: Secondary | ICD-10-CM

## 2018-06-22 DIAGNOSIS — R69 Illness, unspecified: Secondary | ICD-10-CM | POA: Diagnosis not present

## 2018-06-22 DIAGNOSIS — F411 Generalized anxiety disorder: Secondary | ICD-10-CM

## 2018-06-22 NOTE — Chronic Care Management (AMB) (Addendum)
Care Management Note   Christina Gonzales is a 73 y.o. year old female who is a primary care patient of Christina Balloon, FNP. The CM team was consulted for assistance with chronic disease management and care coordination.   I reached out to Christina Gonzales by phone today.   Review of patient status, including review of consultants reports, relevant laboratory and other test results, and collaboration with appropriate care team members and the patient's provider was performed as part of comprehensive patient evaluation and provision of chronic care management services.   Social Determinants of Health: Grief issues over past death of her son and over death of her brother in 03/23/18     Chronic Care Management from 04/06/2018 in [place]  PHQ-9 Total Score  4     Goals Addressed            This Visit's Progress   . Client stated:  " I want to talk with someone about grief issues and managing grief symptoms" (pt-stated)       Current Barriers:  [name] Grief issues related to past death of her son . Grief issues related to death of her brother 2018-03-23)  Clinical Social Work Clinical Goal(s):  [name] Over the next 30 days, client will work with LCSW to address concerns related to grief issues of client and managing grief issues experienced by client.   Interventions:  LCSW previously talked with RN CM regarding patient medication cost issues Provided counseling support for client LCSW talked with client about relaxation techniques to help client manage grief symptoms faced  Talked with client about grief issues faced by client Talked with client about self care activities  Patient Self Care Activities:  . Self administers medications as prescribed . Attends all scheduled provider appointments . Performs ADL's independently  . Calls provider office as needed to ask medical questions regarding client medical needs.  Plan:  Patient will talk   with RN CM Christina Gonzales regarding medication cost issue of client LCSW will call client in next 3 weeks to further assess psychosocial needs of client. Patient will call LCSW as needed to discuss grief management of client Client will attend medical appointments as scheduled LCSW to talk further with client about relaxation techniques to help client manage grief symptoms experienced.  Initial goal documentation     Client said she had appointment with Christina Dun FNP last week. Client said she had blood work done and a medication adjusted.  Client said she plans to start walking again soon at local park.  LCSW talked with client about exercise and outdoor activities of choice to help manage stress/anxiety. She has no transportation problems.  She said she has adequate appetite. She is sleeping better recently. Client said she communicates occasionally with her sister in law. Client said she likes listening to music, reading the Bible to relax. Client said she goes back to see Christina Dun FNP in June of 2020. Client has prescribed medications.  LCSW talked with client about self care activities of choice, of use of relaxation techniques to manage anxiety and of socialization with family and friends as a way to manage stress.   Follow Up Plan: LCSW to call client in next 3 weeks to talk with client about psychosocial needs of client.  Christina Gonzales.Christina Gonzales MSW, LCSW Licensed Clinical Social Worker [facility] Care Management [phone]  I have reviewed and agree with the above documentation.  Christina Dun, FNP

## 2018-06-22 NOTE — Patient Instructions (Signed)
Licensed Clinical Social Worker Visit Information  Goals we discussed today:  Goals Addressed            This Visit's Progress   . Client stated:  " I want to talk with someone about grief issues and managing grief symptoms" (pt-stated)       Current Barriers:  [name] Grief issues related to past death of her son . Grief issues related to death of her brother 03/21/2018)  Clinical Social Work Clinical Goal(s):  [name] Over the next 30 days, client will work with LCSW to address concerns related to grief issues of client and managing grief issues experienced by client.   Interventions:  Previously talked with RN CM regarding patient medication cost issues Provided counseling support for client LCSW talked  with client about relaxation techniques to help client manage grief symptoms faced  Talked with client about grief issues experienced by client  Talked with client about self care activities  Patient Self Care Activities:  . Self administers medications as prescribed . Attends all scheduled provider appointments . Performs ADL's independently  . Calls provider office as needed to ask medical questions regarding client medical needs.  Plan:  Patient will talk  with RN CM [name] regarding medication cost issue of client LCSW will call client in next 3 weeks to further assess psychosocial needs of client. Patient will call LCSW as needed to discuss grief management of client Client will attend medical appointments as scheduled LCSW to talk further with client about relaxation techniques to help client manage grief symptoms experienced.       Materials Provided: No  Follow Up Plan: LCSW to call client in next 3 weeks to assess psychosocial needs of client.  The patient verbalized understanding of instructions provided today and declined a print copy of patient instruction materials.   [name].Kelty Szafran MSW, LCSW Licensed Clinical Social Worker [facility] Care Management [phone]

## 2018-07-10 ENCOUNTER — Ambulatory Visit (INDEPENDENT_AMBULATORY_CARE_PROVIDER_SITE_OTHER): Payer: Medicare HMO | Admitting: Licensed Clinical Social Worker

## 2018-07-10 DIAGNOSIS — Z86718 Personal history of other venous thrombosis and embolism: Secondary | ICD-10-CM

## 2018-07-10 DIAGNOSIS — F411 Generalized anxiety disorder: Secondary | ICD-10-CM

## 2018-07-10 DIAGNOSIS — F4321 Adjustment disorder with depressed mood: Secondary | ICD-10-CM

## 2018-07-10 DIAGNOSIS — F320 Major depressive disorder, single episode, mild: Secondary | ICD-10-CM

## 2018-07-10 DIAGNOSIS — E785 Hyperlipidemia, unspecified: Secondary | ICD-10-CM

## 2018-07-10 DIAGNOSIS — I10 Essential (primary) hypertension: Secondary | ICD-10-CM

## 2018-07-10 DIAGNOSIS — R69 Illness, unspecified: Secondary | ICD-10-CM | POA: Diagnosis not present

## 2018-07-10 NOTE — Chronic Care Management (AMB) (Addendum)
Care Management Note   Christina Gonzales is a 73 y.o. year old female who is a primary care patient of Sharion Balloon, FNP. The CM team was consulted for assistance with chronic disease management and care coordination.   I reached out to Christina Gonzales by phone today.    Review of patient status, including review of consultants reports, relevant laboratory and other test results, and collaboration with appropriate care team members and the patient's provider was performed as part of comprehensive patient evaluation and provision of chronic care management services.   Social Determinants of Health: Grief issues related to past death of her son and grief issues related to death of her brother in 03-Mar-2018    Chronic Care Management from 04/06/2018 in [facility]  PHQ-9 Total Score  4     GAD 7 : Generalized Anxiety Score 08/09/2015  Nervous, Anxious, on Edge 3  Control/stop worrying 1  Worry too much - different things 1  Trouble relaxing 2  Restless 1  Easily annoyed or irritable 0  Afraid - awful might happen 0  Total GAD 7 Score 8  Anxiety Difficulty Somewhat difficult    Goals Addressed            This Visit's Progress   . Client stated:  " I want to talk with someone about grief issues and managing grief symptoms" (pt-stated)       Current Barriers:  Christina Gonzales Grief issues related to past death of her son . Grief issues related to death of her brother 03/03/18)  Clinical Social Work Clinical Goal(s):  Christina Gonzales Over the next 30 days, client will work with LCSW to address concerns related to grief issues of client and managing grief issues experienced by client.   Interventions:  Provided counseling support for client LCSW talked with client about relaxation techniques to help client manage grief symptoms faced  Talked with client about grief issues experienced by client LCSW talked with client about exercise outdoors , such as taking walks  LCSW talked with client about social support network (talking via phone with family members for support)  Patient Self Care Activities:  . Self administers medications as prescribed . Attends all scheduled provider appointments . Performs ADL's independently  . Calls provider office as needed to ask medical questions regarding client medical needs.  Plan:  Patient will talk  with RN CM [name] regarding medication cost issue of client LCSW will call client in next 3 weeks to further assess psychosocial needs of client. Patient will call LCSW as needed to discuss grief management of client Client will attend medical appointments as scheduled LCSW to talk further with client about relaxation techniques to help client manage grief symptoms experienced.  Initial goal documentation        Client said she has started walking again outdoors. LCSW talked with client about exercise and outdoor activities of choice to help manage stress/anxiety. She has no transportation problems.  She said she has adequate appetite. She is sleeping better recently. Client said she communicates occasionally with her sister in law. Client said she likes listening to music, reading the Bible to relax. Client said she goes back to see Christina Dun FNP in June of 2020. Client has prescribed medications.  LCSW talked with client about self care activities of choice, of use of relaxation techniques to manage anxiety and of socialization with family and friends as a way to manage stress. Client spoke  of her blood pressure readings.  She said she thought her blood pressure readings were fairly normal recently  She did not speak of any pain issues.Client enjoys speaking via phone with family members. LCSW talked with client about grief issues related to loss of her brother.  LCSW provided counseling support for client LCSW also encouraged Bartolo Darter to call RNCM as needed to discuss nursing needs of client Client said she  does have prescription filled for Zoloft as ordered. She said she "does not want to become dependent on a medication. She wants to only take medicine if she really needs it" (client words). She said she does take Zoloft occasionally as needed.  She spoke of her niece making a garden area and having pictures of her brother in garden area as a remembrance to her brother. Client was appreciative of call from LCSW on 07/10/2018  Follow Up Plan: LCSW will call client in next 3 weeks to assess psychosocial needs of client  [name].Jefrey Raburn MSW, LCSW Licensed Clinical Social Worker [facility] [phone]  I have reviewed and agree with the above AWV documentation.   Christina Dun, FNP

## 2018-07-10 NOTE — Patient Instructions (Addendum)
Licensed Clinical Social Worker Visit Information  Goals we discussed today:  Goals Addressed            This Visit's Progress   . Client stated:  " I want to talk with someone about grief issues and managing grief symptoms" (pt-stated)       Current Barriers:  [name] Grief issues related to past death of her son . Grief issues related to death of her brother 03/26/18)  Clinical Social Work Clinical Goal(s):  [name] Over the next 30 days, client will work with LCSW to address concerns related to grief issues of client and managing grief issues experienced by client.   Interventions:  Provided counseling support for client LCSW talked with client about relaxation techniques to help client manage grief symptoms faced  Talked with client about grief issues experienced by client Talked with client about exercise outdoors (such as taking walks) Talked with client about social support from family members (such as sister in law or granddchildren)   Patient Self Care Activities:  . Self administers medications as prescribed . Attends all scheduled provider appointments . Performs ADL's independently  . Calls provider office as needed to ask medical questions regarding client medical needs.  Plan:  Patient will talk  with RN CM [name] regarding medication cost issue of client LCSW will call client in next 3 weeks to further assess psychosocial needs of client. Patient will call LCSW as needed to discuss grief management of client Client will attend medical appointments as scheduled LCSW to talk further with client about relaxation techniques to help client manage grief symptoms experienced.  Initial goal documentation     Materials Provided: No   Follow Up Plan: LCSW to call client in next 3 weeks to further assess psychosocial needs of client  The patient verbalized understanding of instructions provided today and declined a print copy of patient instruction materials.    [name].Eshaal Duby MSW, LCSW Licensed Clinical Social Worker [facility] Care [facility] [phone]

## 2018-07-14 ENCOUNTER — Ambulatory Visit: Payer: Medicare HMO | Admitting: Family

## 2018-07-29 ENCOUNTER — Other Ambulatory Visit: Payer: Self-pay

## 2018-07-30 ENCOUNTER — Ambulatory Visit (INDEPENDENT_AMBULATORY_CARE_PROVIDER_SITE_OTHER): Payer: Medicare HMO | Admitting: Family

## 2018-07-30 ENCOUNTER — Encounter: Payer: Self-pay | Admitting: Family

## 2018-07-30 VITALS — BP 147/79 | HR 77 | Temp 98.8°F | Ht 62.0 in | Wt 185.2 lb

## 2018-07-30 DIAGNOSIS — F411 Generalized anxiety disorder: Secondary | ICD-10-CM

## 2018-07-30 DIAGNOSIS — F320 Major depressive disorder, single episode, mild: Secondary | ICD-10-CM | POA: Diagnosis not present

## 2018-07-30 DIAGNOSIS — R69 Illness, unspecified: Secondary | ICD-10-CM | POA: Diagnosis not present

## 2018-07-30 DIAGNOSIS — I10 Essential (primary) hypertension: Secondary | ICD-10-CM | POA: Diagnosis not present

## 2018-07-30 NOTE — Patient Instructions (Signed)

## 2018-07-30 NOTE — Progress Notes (Signed)
Subjective:    Patient ID: Christina Gonzales, female    DOB: 09/05/1945, 73 y.o.   MRN: [id number]  Chief Complaint  Patient presents with  . Hypertension   Pt presents to the office today to recheck HTN  And Depression. She was seen on 06/11/18 and we increased her lisinopril to 40 mg from 20 mg.  Hypertension This is a chronic problem. The current episode started more than 1 year ago. The problem has been waxing and waning since onset. The problem is uncontrolled. Associated symptoms include anxiety. Pertinent negatives include no peripheral edema or shortness of breath. Risk factors for coronary artery disease include dyslipidemia, obesity and sedentary lifestyle. The current treatment provides mild improvement. There is no history of kidney disease, CAD/MI, CVA or heart failure.  Anxiety Presents for follow-up visit. Symptoms include depressed mood, excessive worry, irritability, nervous/anxious behavior and restlessness. Patient reports no shortness of breath. Symptoms occur most days. The severity of symptoms is moderate. The quality of sleep is good.    Depression        This is a chronic problem.  The current episode started more than 1 year ago.   The onset quality is sudden.   The problem occurs intermittently.  The problem has been waxing and waning since onset.  Associated symptoms include irritable, restlessness and sad.  Associated symptoms include no helplessness and no hopelessness.  Past treatments include SSRIs - Selective serotonin reuptake inhibitors.  Compliance with treatment is good.  Past medical history includes anxiety.       Review of Systems  Constitutional: Positive for irritability.  Respiratory: Negative for shortness of breath.   Psychiatric/Behavioral: Positive for depression. The patient is nervous/anxious.   All other systems reviewed and are negative.      Objective:   Physical Exam Vitals signs reviewed.  Constitutional:      General: She is  irritable. She is not in acute distress.    Appearance: She is well-developed.  HENT:     Head: Normocephalic and atraumatic.     Right Ear: Tympanic membrane normal.     Left Ear: Tympanic membrane normal.  Eyes:     Pupils: Pupils are equal, round, and reactive to light.  Neck:     Musculoskeletal: Normal range of motion and neck supple.     Thyroid: No thyromegaly.  Cardiovascular:     Rate and Rhythm: Normal rate and regular rhythm.     Heart sounds: Normal heart sounds. No murmur.  Pulmonary:     Effort: Pulmonary effort is normal. No respiratory distress.     Breath sounds: Normal breath sounds. No wheezing.  Abdominal:     General: Bowel sounds are normal. There is no distension.     Palpations: Abdomen is soft.     Tenderness: There is no abdominal tenderness.  Musculoskeletal: Normal range of motion.        General: No tenderness.  Skin:    General: Skin is warm and dry.  Neurological:     Mental Status: She is alert and oriented to person, place, and time.     Cranial Nerves: No cranial nerve deficit.     Deep Tendon Reflexes: Reflexes are normal and symmetric.  Psychiatric:        Behavior: Behavior normal.        Thought Content: Thought content normal.        Judgment: Judgment normal.       BP (!) 147/79   Pulse  77   Temp 98.8 F (37.1 C) (Oral)   Ht _0  (1.575 m)   Wt 185 lb 3.2 oz (84 kg)   BMI 33.87 kg/m      Assessment & Plan:  Christina Gonzales comes in today with chief complaint of Hypertension   Diagnosis and orders addressed:  1. Essential hypertension, benign -Daily blood pressure log given with instructions on how to fill out and told to bring to next visit -Dash diet information given -Exercise encouraged - Stress Management  -Continue current meds -RTO in 3 months  - BMP8+EGFR  2. GAD (generalized anxiety disorder) Continue Zoloft  Stress management  - BMP8+EGFR  3. Current mild episode of major depressive disorder  without prior episode ([place]) Continue Zoloft  Stress management  - BMP8+EGFR   Labs pending Diet and exercise encouraged  Follow up plan: 3 months    [name], FNP

## 2018-07-31 ENCOUNTER — Ambulatory Visit: Payer: Self-pay | Admitting: Licensed Clinical Social Worker

## 2018-07-31 DIAGNOSIS — Z86718 Personal history of other venous thrombosis and embolism: Secondary | ICD-10-CM

## 2018-07-31 DIAGNOSIS — F320 Major depressive disorder, single episode, mild: Secondary | ICD-10-CM

## 2018-07-31 DIAGNOSIS — F4321 Adjustment disorder with depressed mood: Secondary | ICD-10-CM

## 2018-07-31 DIAGNOSIS — E785 Hyperlipidemia, unspecified: Secondary | ICD-10-CM

## 2018-07-31 DIAGNOSIS — I10 Essential (primary) hypertension: Secondary | ICD-10-CM

## 2018-07-31 DIAGNOSIS — F411 Generalized anxiety disorder: Secondary | ICD-10-CM

## 2018-07-31 LAB — BMP8+EGFR
BUN/Creatinine Ratio: 27 (ref 12–28)
BUN: 20 mg/dL (ref 8–27)
CO2: 18 mmol/L — ABNORMAL LOW (ref 20–29)
Calcium: 9.1 mg/dL (ref 8.7–10.3)
Chloride: 105 mmol/L (ref 96–106)
Creatinine, Ser: 0.73 mg/dL (ref 0.57–1.00)
GFR calc Af Amer: 94 mL/min/{1.73_m2} (ref >59)
GFR calc non Af Amer: 82 mL/min/{1.73_m2} (ref >59)
Glucose: 109 mg/dL — ABNORMAL HIGH (ref 65–99)
Potassium: 4.5 mmol/L (ref 3.5–5.2)
Sodium: 142 mmol/L (ref 134–144)

## 2018-07-31 NOTE — Chronic Care Management (AMB) (Addendum)
  Chronic Care Management    Clinical Social Work CCM Outreach Note  07/31/2018 Name: Christina Gonzales MRN: [id number] DOB: 1945/03/24  Christina Gonzales is a 73 y.o. year old female who is a primary care patient of Sharion Balloon, FNP . The CCM team was consulted for assistance with assessment of psychosocial needs.   LCSW reached out to [name] today by phone   Social Determinants of Health: Grief issues related to past death of her son and grief issues related to death of her brother in 03/08/2018    Office Visit from 07/30/2018 in [facility]  PHQ-9 Total Score  0       07/30/2018 06/11/2018   Goals        . Client stated:  " I want to talk with someone about grief issues and managing grief symptoms" (pt-stated)     Current Barriers:  [name] Grief issues related to past death of her son . Grief issues related to death of her brother March 08, 2018)  Clinical Social Work Clinical Goal(s):  [name] Over the next 30 days, client will work with LCSW to address concerns related to grief issues of client and managing grief issues experienced by client.   Interventions:   LCSW has talked previously with client about relaxation techniques to help client manage grief symptoms faced  Previously talked with client about grief issues experienced by client  Patient Self Care Activities:  . Self administers medications as prescribed . Attends all scheduled provider appointments . Performs ADL's independently  . Calls provider office as needed to ask medical questions regarding client medical needs.  Plan: Patient will talk  with RN CM [name] regarding medication cost issue of client LCSW will call client in next 3 weeks to further assess psychosocial needs of client. Patient will call LCSW as needed to discuss grief management of client Client will attend medical appointments as scheduled LCSW to talk further with client about relaxation  techniques to help client manage grief symptoms experienced.  Initial goal documentation      Client had said she had started walking again outdoors. LCSW has talked with client about exercise and outdoor activities of choice to help manage stress/anxiety. She has no transportation problems. She said she has adequate appetite. Client has said she communicates occasionally with her sister in law. Client said she likes listening to music, reading the Bible to relax. Client has prescribed medications. LCSW talked with client about self care activities of choice, of use of relaxation techniques to manage anxiety and of socialization with family and friends as a way to manage stress. LCSW also encouraged [name] to call RNCM as needed to discuss nursing needs of client Client said she does have prescription filled for Zoloft as ordered. She said she "does not want to become dependent on a medication. She wants to only take medicine if she really needs it" (client words). She said she does take Zoloft occasionally as needed.  She spoke of her niece making a garden area and having pictures of her brother in garden area as a remembrance to her brother.   Follow Up Plan: LCSW will call client in next 3 weeks to assess psychosocial needs of client  Christina Gonzales.Ashlei Chinchilla MSW, LCSW Licensed Clinical Social Worker [facility] Care [facility] [phone]  I have reviewed and agree with the above  documentation.   [name], FNP

## 2018-07-31 NOTE — Patient Instructions (Signed)
Licensed Clinical Social Worker Visit Information  Goals we discussed today:  Goals Addressed            This Visit's Progress   . Client stated:  " I want to talk with someone about grief issues and managing grief symptoms" (pt-stated)       Current Barriers:  [name] Grief issues related to past death of her son . Grief issues related to death of her brother 03-10-2018)  Clinical Social Work Clinical Goal(s):  [name] Over the next 30 days, client will work with LCSW to address concerns related to grief issues of client and managing grief issues experienced by client.   Interventions:   LCSW has talked previously with client about relaxation techniques to help client manage grief symptoms faced  Previously talked with client about grief issues experienced by client  Patient Self Care Activities:  . Self administers medications as prescribed . Attends all scheduled provider appointments . Performs ADL's independently  . Calls provider office as needed to ask medical questions regarding client medical needs.  Plan: Patient will talk  with RN CM [name] regarding medication cost issue of client LCSW will call client in next 3 weeks to further assess psychosocial needs of client. Patient will call LCSW as needed to discuss grief management of client Client will attend medical appointments as scheduled LCSW to talk further with client about relaxation techniques to help client manage grief symptoms experienced.  Initial goal documentation      Materials Provided: No  Follow Up Plan: LCSW to call client in next 3 weeks to assess psychosocial needs of client  The patient verbalized understanding of instructions provided today and declined a print copy of patient instruction materials.   [name].Raef Sprigg MSW, LCSW Licensed Clinical Social Worker [facility] Care [facility] [phone]

## 2018-08-14 ENCOUNTER — Ambulatory Visit (INDEPENDENT_AMBULATORY_CARE_PROVIDER_SITE_OTHER): Payer: Medicare HMO | Admitting: Licensed Clinical Social Worker

## 2018-08-14 DIAGNOSIS — I10 Essential (primary) hypertension: Secondary | ICD-10-CM

## 2018-08-14 DIAGNOSIS — F4321 Adjustment disorder with depressed mood: Secondary | ICD-10-CM | POA: Diagnosis not present

## 2018-08-14 DIAGNOSIS — F320 Major depressive disorder, single episode, mild: Secondary | ICD-10-CM | POA: Diagnosis not present

## 2018-08-14 DIAGNOSIS — Z86718 Personal history of other venous thrombosis and embolism: Secondary | ICD-10-CM

## 2018-08-14 DIAGNOSIS — E785 Hyperlipidemia, unspecified: Secondary | ICD-10-CM | POA: Diagnosis not present

## 2018-08-14 DIAGNOSIS — F411 Generalized anxiety disorder: Secondary | ICD-10-CM

## 2018-08-14 DIAGNOSIS — R69 Illness, unspecified: Secondary | ICD-10-CM | POA: Diagnosis not present

## 2018-08-14 NOTE — Patient Instructions (Addendum)
Licensed Clinical Social Worker Visit Information  Goals        . Client stated:  " I want to talk with someone about grief issues and managing grief symptoms" (pt-stated)     Current Barriers:  [name] Grief issues related to past death of her son . Grief issues related to death of her brother 2018-03-27)  Clinical Social Work Clinical Goal(s):  [name] Over the next 30 days, client will work with LCSW to address concerns related to grief issues of client and managing grief issues experienced by client.   Interventions:  Provided counseling support for client LCSW has talked previously with client about relaxation techniques to help client manage grief symptoms faced  Talked with client about grief issues experienced by client   Patient Self Care Activities:  . Self administers medications as prescribed . Attends all scheduled provider appointments . Performs ADL's independently  . Calls provider office as needed to ask medical questions regarding client medical needs.  Plan: Patient will talk  with RN CM [name] regarding medication cost issue of client LCSW will call client in next 4 weeks to further assess psychosocial needs of client. Patient will call LCSW as needed to discuss grief management of client Client will attend medical appointments as scheduled LCSW to talk further with client about relaxation techniques to help client manage grief symptoms experienced.  Initial goal documentation       Materials Provided: No  Follow Up Plan:  LCSW to call client in next 4 weeks to talk with client about psychosocial needs of client  The patient verbalized understanding of instructions provided today and declined a print copy of patient instruction materials.   [name].Sharanya Templin MSW, LCSW Licensed Clinical Social Worker [facility] [phone]

## 2018-08-14 NOTE — Chronic Care Management (AMB) (Addendum)
  Care Management Note   Christina Gonzales is a 73 y.o. year old female who is a primary care patient of Sharion Balloon, FNP. The CM team was consulted for assistance with chronic disease management and care coordination.   I reached out to Christina Gonzales by phone today.   Review of patient status, including review of consultants reports, relevant laboratory and other test results, and collaboration with appropriate care team members and the patient's provider was performed as part of comprehensive patient evaluation and provision of chronic care management services.   Social Determinants of Health:Grief issues related to death of her brother in 2018-03-11    Office Visit from 07/30/2018 in [facility]  PHQ-9 Total Score  0     GAD 7 : Generalized Anxiety Score 08/09/2015  Nervous, Anxious, on Edge 3  Control/stop worrying 1  Worry too much - different things 1  Trouble relaxing 2  Restless 1  Easily annoyed or irritable 0  Afraid - awful might happen 0  Total GAD 7 Score 8  Anxiety Difficulty Somewhat difficult    Goals    . Client stated:  " I want to talk with someone about grief issues and managing grief symptoms" (pt-stated)     Current Barriers:  Christina Gonzales Grief issues related to past death of her son . Grief issues related to death of her brother 03-11-18)  Clinical Social Work Clinical Goal(s):  Christina Gonzales Over the next 30 days, client will work with LCSW to address concerns related to grief issues of client and managing grief issues experienced by client.   Interventions:  Provided counseling support for client LCSW has talked previously with client about relaxation techniques to help client manage grief symptoms faced  Talked with client about grief issues experienced by client  Patient Self Care Activities:  . Self administers medications as prescribed . Attends all scheduled provider appointments . Performs ADL's independently  .  Calls provider office as needed to ask medical questions regarding client medical needs.  Plan: Patient will talk  with RN CM [name] regarding medication cost issue of client LCSW will call client in next 4 weeks to further assess psychosocial needs of client. Patient will call LCSW as needed to discuss grief management of client Client will attend medical appointments as scheduled LCSW to talk further with client about relaxation techniques to help client manage grief symptoms experienced.  Initial goal documentation      Client enjoys outdoor activities and said she had [place]. She said she is sleeping well.  She said she is eating adequately. She said she is still adjusting to death of her brother.  She said she is adjusting better to grief issues at present.  She has her prescribed medications. She said she has family support. LCSW also encouraged client to communicate with RNCM as needed for nursing support  Follow Up Plan: LCSW to call client in next 4 weeks to further assess the psychosocial needs of client.  Christina Gonzales.Christina Gonzales MSW, LCSW Licensed Clinical Social Worker [facility] [phone]  I have reviewed and agree with the above  documentation.   Christina Dun, FNP

## 2018-08-20 ENCOUNTER — Ambulatory Visit: Payer: Medicare HMO | Admitting: Family

## 2018-08-27 ENCOUNTER — Ambulatory Visit: Payer: Medicare HMO

## 2018-08-31 ENCOUNTER — Telehealth: Payer: Self-pay | Admitting: Family

## 2018-09-02 ENCOUNTER — Other Ambulatory Visit: Payer: Self-pay

## 2018-09-03 ENCOUNTER — Ambulatory Visit (INDEPENDENT_AMBULATORY_CARE_PROVIDER_SITE_OTHER): Payer: Medicare HMO | Admitting: Family

## 2018-09-03 ENCOUNTER — Encounter: Payer: Self-pay | Admitting: Family

## 2018-09-03 VITALS — BP 162/70 | HR 66 | Temp 97.1°F | Ht 62.0 in | Wt 181.0 lb

## 2018-09-03 DIAGNOSIS — I10 Essential (primary) hypertension: Secondary | ICD-10-CM

## 2018-09-03 NOTE — Patient Instructions (Signed)

## 2018-09-03 NOTE — Progress Notes (Signed)
   Subjective:    Patient ID: Christina Gonzales, female    DOB: January 29, 1946, 73 y.o.   MRN: [id number]  Chief Complaint  Patient presents with  . Hypertension   Pt presents to the office today to recheck HTN. Hypertension This is a chronic problem. The current episode started more than 1 year ago. The problem has been waxing and waning since onset. The problem is uncontrolled. Associated symptoms include malaise/fatigue. Pertinent negatives include no peripheral edema or shortness of breath. Past treatments include ACE inhibitors. The current treatment provides mild improvement.      Review of Systems  Constitutional: Positive for malaise/fatigue.  Respiratory: Negative for shortness of breath.   All other systems reviewed and are negative.      Objective:   Physical Exam Vitals signs reviewed.  Constitutional:      General: She is not in acute distress.    Appearance: She is well-developed.  HENT:     Head: Normocephalic and atraumatic.  Eyes:     Pupils: Pupils are equal, round, and reactive to light.  Neck:     Musculoskeletal: Normal range of motion and neck supple.     Thyroid: No thyromegaly.  Cardiovascular:     Rate and Rhythm: Normal rate and regular rhythm.     Heart sounds: Normal heart sounds. No murmur.  Pulmonary:     Effort: Pulmonary effort is normal. No respiratory distress.     Breath sounds: Normal breath sounds. No wheezing.  Abdominal:     General: Bowel sounds are normal. There is no distension.     Palpations: Abdomen is soft.     Tenderness: There is no abdominal tenderness.  Musculoskeletal: Normal range of motion.        General: No tenderness.  Skin:    General: Skin is warm and dry.  Neurological:     Mental Status: She is alert and oriented to person, place, and time.     Cranial Nerves: No cranial nerve deficit.     Deep Tendon Reflexes: Reflexes are normal and symmetric.  Psychiatric:        Behavior: Behavior normal.      Thought Content: Thought content normal.        Judgment: Judgment normal.       BP (!) 162/70   Pulse 66   Temp (!) 97.1 F (36.2 C) (Oral)   Ht 5\' 2"  (1.575 m)   Wt 181 lb (82.1 kg)   BMI 33.11 kg/m      Assessment & Plan:  Christina Gonzales comes in today with chief complaint of Hypertension   Diagnosis and orders addressed:  1. Essential hypertension, benign Continue current medications -Dash diet information given -Exercise encouraged - Stress Management  -Continue current meds -RTO in 3 months    [name], FNP

## 2018-09-07 ENCOUNTER — Other Ambulatory Visit (HOSPITAL_COMMUNITY): Payer: Self-pay | Admitting: *Deleted

## 2018-09-07 DIAGNOSIS — R918 Other nonspecific abnormal finding of lung field: Secondary | ICD-10-CM

## 2018-09-07 DIAGNOSIS — I82492 Acute embolism and thrombosis of other specified deep vein of left lower extremity: Secondary | ICD-10-CM

## 2018-09-08 ENCOUNTER — Inpatient Hospital Stay (HOSPITAL_COMMUNITY): Payer: Medicare HMO | Attending: Hematology

## 2018-09-08 ENCOUNTER — Other Ambulatory Visit: Payer: Self-pay

## 2018-09-08 ENCOUNTER — Ambulatory Visit (HOSPITAL_COMMUNITY): Payer: Medicare HMO

## 2018-09-08 DIAGNOSIS — I82492 Acute embolism and thrombosis of other specified deep vein of left lower extremity: Secondary | ICD-10-CM

## 2018-09-08 DIAGNOSIS — Z7901 Long term (current) use of anticoagulants: Secondary | ICD-10-CM | POA: Insufficient documentation

## 2018-09-08 DIAGNOSIS — Z86718 Personal history of other venous thrombosis and embolism: Secondary | ICD-10-CM | POA: Diagnosis not present

## 2018-09-08 DIAGNOSIS — R918 Other nonspecific abnormal finding of lung field: Secondary | ICD-10-CM | POA: Diagnosis present

## 2018-09-08 LAB — COMPREHENSIVE METABOLIC PANEL
ALT: 55 U/L — ABNORMAL HIGH (ref 0–44)
AST: 40 U/L (ref 15–41)
Albumin: 4.1 g/dL (ref 3.5–5.0)
Alkaline Phosphatase: 27 U/L — ABNORMAL LOW (ref 38–126)
Anion gap: 9 (ref 5–15)
BUN: 26 mg/dL — ABNORMAL HIGH (ref 8–23)
CO2: 27 mmol/L (ref 22–32)
Calcium: 10 mg/dL (ref 8.9–10.3)
Chloride: 103 mmol/L (ref 98–111)
Creatinine, Ser: 0.64 mg/dL (ref 0.44–1.00)
GFR calc Af Amer: >60 mL/min (ref >60)
GFR calc non Af Amer: >60 mL/min (ref >60)
Glucose, Bld: 112 mg/dL — ABNORMAL HIGH (ref 70–99)
Potassium: 4.7 mmol/L (ref 3.5–5.1)
Sodium: 139 mmol/L (ref 135–145)
Total Bilirubin: 0.5 mg/dL (ref 0.3–1.2)
Total Protein: 7.4 g/dL (ref 6.5–8.1)

## 2018-09-08 LAB — CBC WITH DIFFERENTIAL/PLATELET
Abs Immature Granulocytes: 0.01 10*3/uL (ref 0.00–0.07)
Basophils Absolute: 0.1 10*3/uL (ref 0.0–0.1)
Basophils Relative: 1 %
Eosinophils Absolute: 0.2 10*3/uL (ref 0.0–0.5)
Eosinophils Relative: 2 %
HCT: 43.8 % (ref 36.0–46.0)
Hemoglobin: 14.1 g/dL (ref 12.0–15.0)
Immature Granulocytes: 0 %
Lymphocytes Relative: 36 %
Lymphs Abs: 2.9 10*3/uL (ref 0.7–4.0)
MCH: 29.4 pg (ref 26.0–34.0)
MCHC: 32.2 g/dL (ref 30.0–36.0)
MCV: 91.3 fL (ref 80.0–100.0)
Monocytes Absolute: 0.6 10*3/uL (ref 0.1–1.0)
Monocytes Relative: 7 %
Neutro Abs: 4.3 10*3/uL (ref 1.7–7.7)
Neutrophils Relative %: 54 %
Platelets: 207 10*3/uL (ref 150–400)
RBC: 4.8 MIL/uL (ref 3.87–5.11)
RDW: 12.7 % (ref 11.5–15.5)
WBC: 7.9 10*3/uL (ref 4.0–10.5)
nRBC: 0 % (ref 0.0–0.2)

## 2018-09-08 LAB — LACTATE DEHYDROGENASE: LDH: 166 U/L (ref 98–192)

## 2018-09-11 ENCOUNTER — Ambulatory Visit: Payer: Self-pay | Admitting: Licensed Clinical Social Worker

## 2018-09-11 DIAGNOSIS — I10 Essential (primary) hypertension: Secondary | ICD-10-CM

## 2018-09-11 DIAGNOSIS — F4321 Adjustment disorder with depressed mood: Secondary | ICD-10-CM

## 2018-09-11 DIAGNOSIS — E785 Hyperlipidemia, unspecified: Secondary | ICD-10-CM

## 2018-09-11 DIAGNOSIS — F320 Major depressive disorder, single episode, mild: Secondary | ICD-10-CM

## 2018-09-11 DIAGNOSIS — Z86718 Personal history of other venous thrombosis and embolism: Secondary | ICD-10-CM

## 2018-09-11 DIAGNOSIS — F411 Generalized anxiety disorder: Secondary | ICD-10-CM

## 2018-09-11 NOTE — Chronic Care Management (AMB) (Addendum)
  Care Management Note   Christina Gonzales is a 73 y.o. year old female who is a primary care patient of Sharion Balloon, FNP. The CM team was consulted for assistance with chronic disease management and care coordination.   Review of patient status, including review of consultants reports, relevant laboratory and other test results, and collaboration with appropriate care team members and the patient's provider was performed as part of comprehensive patient evaluation and provision of chronic care management services.   Social Determinants of Health: risk for tobacco use; risk for depression    Office Visit from 07/30/2018 in [facility]  PHQ-9 Total Score  0     GAD 7 : Generalized Anxiety Score 08/09/2015  Nervous, Anxious, on Edge 3  Control/stop worrying 1  Worry too much - different things 1  Trouble relaxing 2  Restless 1  Easily annoyed or irritable 0  Afraid - awful might happen 0  Total GAD 7 Score 8  Anxiety Difficulty Somewhat difficult   Client said shehad startedwalking againoutdoors.LCSW has talked with client about exercise and outdoor activities of choice to help manage stress/anxiety. She has no transportation problems. She said she has adequate appetite. Client has said she communicates occasionally with her sister in law. Client said she likes listening to music, reading the Bible to relax. Client has prescribed medications. LCSW talked with client about self care activities of choice, of use of relaxation techniques to manage anxiety and of socialization with family and friends as a way to manage stress.LCSW also encouraged Palma to call RNCM as needed to discuss nursing needs of client    Follow Up Plan:  LCSW to call client in next 3 weeks to assess the psychosocial needs of client at that time  [name].Vienna Folden MSW, LCSW Licensed Clinical Social Worker [facility] Care Management [phone]  I  have reviewed and agree with the above documentation.   [name], FNP

## 2018-09-11 NOTE — Patient Instructions (Addendum)
Licensed Clinical Water engineer Provided: No  Clientsaid shehadstartedwalking againoutdoors.LCSWhastalked with client about exercise and outdoor activities of choice to help manage stress/anxiety. She has no transportation problems. She said she has adequate appetite. Clienthassaid she communicates occasionally with her sister in law. Client said she likes listening to music, reading the Bible to relax. Client has prescribed medications. LCSW talked with client about self care activities of choice, of use of relaxation techniques to manage anxiety and of socialization with family and friends as a way to manage stress.LCSW also encouraged Annjanette to call RNCM as needed to discuss nursing needs of client    Follow Up Plan: LCSW to call client in next 3 weeks to assess the psychosocial needs of  client at that time  The patient verbalized understanding of instructions provided today and declined a print copy of patient instruction materials.   [name].Fayne Mcguffee MSW, LCSW Licensed Clinical Social Worker [facility] Care Management [phone]

## 2018-09-15 ENCOUNTER — Other Ambulatory Visit: Payer: Self-pay

## 2018-09-15 ENCOUNTER — Inpatient Hospital Stay (HOSPITAL_BASED_OUTPATIENT_CLINIC_OR_DEPARTMENT_OTHER): Payer: Medicare HMO | Admitting: Nurse Practitioner

## 2018-09-15 VITALS — BP 150/72 | HR 88 | Temp 97.6°F | Resp 18 | Wt 179.8 lb

## 2018-09-15 DIAGNOSIS — R918 Other nonspecific abnormal finding of lung field: Secondary | ICD-10-CM | POA: Diagnosis not present

## 2018-09-15 DIAGNOSIS — Z86718 Personal history of other venous thrombosis and embolism: Secondary | ICD-10-CM | POA: Diagnosis not present

## 2018-09-15 DIAGNOSIS — I82492 Acute embolism and thrombosis of other specified deep vein of left lower extremity: Secondary | ICD-10-CM

## 2018-09-15 DIAGNOSIS — R911 Solitary pulmonary nodule: Secondary | ICD-10-CM | POA: Diagnosis not present

## 2018-09-15 DIAGNOSIS — Z7901 Long term (current) use of anticoagulants: Secondary | ICD-10-CM | POA: Diagnosis not present

## 2018-09-15 MED ORDER — APIXABAN 5 MG PO TABS: 5.0000 mg | ORAL_TABLET | Freq: Two times a day (BID) | ORAL | 3 refills | Status: DC

## 2018-09-15 NOTE — Progress Notes (Signed)
Christina Gonzales [place], [place] [address]   CLINIC:  Medical Oncology/Hematology  PCP:  Sharion Balloon, [place] [employer] [place] [address] [phone]   REASON FOR VISIT: Follow-up for DVT  CURRENT THERAPY: Eliquis   INTERVAL HISTORY:  Christina Gonzales 73 y.o. female returns for routine follow-up for DVT.  Patient reports she has had no further problems since her last visit.  She denies any bright red bleeding per rectum or melena.  She denies any nosebleeds.  She is tolerating her Eliquis daily with no side effects. Denies any nausea, vomiting, or diarrhea. Denies any new pains. Had not noticed any recent bleeding such as epistaxis, hematuria or hematochezia. Denies recent chest pain on exertion, shortness of breath on minimal exertion, pre-syncopal episodes, or palpitations. Denies any numbness or tingling in hands or feet. Denies any recent fevers, infections, or recent hospitalizations. Patient reports appetite at 75% and energy level at 75%. She is eating well and maintaining her weight at this time.      REVIEW OF SYSTEMS:  Review of Systems  Psychiatric/Behavioral: Positive for sleep disturbance.  All other systems reviewed and are negative.    PAST MEDICAL/SURGICAL HISTORY:  Past Medical History:  Diagnosis Date  . Anxiety   . Hyperlipidemia   . Seizures ([place])    Past Surgical History:  Procedure Laterality Date  . TUMOR EXCISION     Behind left eye     SOCIAL HISTORY:  Social History   Socioeconomic History  . Marital status: Married    Spouse name: Not on file  . Number of children: Not on file  . Years of education: Not on file  . Highest education level: Not on file  Occupational History  . Not on file  Social Needs  . Financial resource strain: Not on file  . Food insecurity    Worry: Not on file    Inability: Not on file  . Transportation needs    Medical: Not on file    Non-medical: Not on file  Tobacco  Use  . Smoking status: Never Smoker  . Smokeless tobacco: Never Used  Substance and Sexual Activity  . Alcohol use: No  . Drug use: No  . Sexual activity: Not Currently  Lifestyle  . Physical activity    Days per week: Not on file    Minutes per session: Not on file  . Stress: Not on file  Relationships  . Social Herbalist on phone: Not on file    Gets together: Not on file    Attends religious service: Not on file    Active member of club or organization: Not on file    Attends meetings of clubs or organizations: Not on file    Relationship status: Not on file  . Intimate partner violence    Fear of current or ex partner: Not on file    Emotionally abused: Not on file    Physically abused: Not on file    Forced sexual activity: Not on file  Other Topics Concern  . Not on file  Social History Narrative  . Not on file    FAMILY HISTORY:  Family History  Problem Relation Age of Onset  . Cancer Mother        lung    CURRENT MEDICATIONS:  Outpatient Encounter Medications as of 09/15/2018  Medication Sig Note  . apixaban (ELIQUIS) 5 MG TABS tablet Take 1 tablet (5 mg  total) by mouth 2 (two) times daily. 03/26/2018: Expensive  . cholecalciferol (VITAMIN D) 1000 UNITS tablet Take 1,000 Units by mouth daily. Reported on 05/26/2015   . lisinopril (ZESTRIL) 40 MG tablet Take 1 tablet (40 mg total) by mouth daily.   . Multiple Vitamins tablet Take 1 tablet by mouth. Reported on 05/26/2015 04/15/2013: Received from: [facility]  . Omega-3 1000 MG CAPS Take 1 g by mouth. Reported on 05/26/2015 04/15/2013: Received from: [facility]  . OVER THE COUNTER MEDICATION Tumeric for inflammation.   . sertraline (ZOLOFT) 50 MG tablet Take 1 tablet (50 mg total) by mouth daily. (Patient not taking: Reported on 09/03/2018)   . [DISCONTINUED] hydrOXYzine (ATARAX/VISTARIL) 25 MG tablet Take 1 tablet (25 mg total) by mouth at bedtime as needed  (insomnia). (Patient not taking: Reported on 09/03/2018)    No facility-administered encounter medications on file as of 09/15/2018.     ALLERGIES:  Allergies  Allergen Reactions  . Heparin Nausea And Vomiting  . Warfarin Other (See Comments)    unknown  . Penicillins Rash     PHYSICAL EXAM:  ECOG Performance status: 1  Vitals:   09/15/18 1407  BP: (!) 150/72  Pulse: 88  Resp: 18  Temp: 97.6 F (36.4 C)  SpO2: 96%   Filed Weights   09/15/18 1407  Weight: 179 lb 12.8 oz (81.6 kg)    Physical Exam Constitutional:      Appearance: Normal appearance. She is normal weight.  Cardiovascular:     Rate and Rhythm: Normal rate and regular rhythm.     Heart sounds: Normal heart sounds.  Pulmonary:     Effort: Pulmonary effort is normal.     Breath sounds: Normal breath sounds.  Abdominal:     General: Bowel sounds are normal.     Palpations: Abdomen is soft.  Musculoskeletal: Normal range of motion.  Skin:    General: Skin is warm and dry.  Neurological:     Mental Status: She is alert and oriented to person, place, and time. Mental status is at baseline.  Psychiatric:        Mood and Affect: Mood normal.        Behavior: Behavior normal.        Thought Content: Thought content normal.        Judgment: Judgment normal.      LABORATORY DATA:  I have reviewed the labs as listed.  CBC    Component Value Date/Time   WBC 7.9 09/08/2018 1012   RBC 4.80 09/08/2018 1012   HGB 14.1 09/08/2018 1012   HGB 13.9 06/11/2018 1531   HCT 43.8 09/08/2018 1012   HCT 41.3 06/11/2018 1531   PLT 207 09/08/2018 1012   PLT 201 06/11/2018 1531   MCV 91.3 09/08/2018 1012   MCV 89 06/11/2018 1531   MCH 29.4 09/08/2018 1012   MCHC 32.2 09/08/2018 1012   RDW 12.7 09/08/2018 1012   RDW 13.1 06/11/2018 1531   LYMPHSABS 2.9 09/08/2018 1012   LYMPHSABS 3.1 06/11/2018 1531   MONOABS 0.6 09/08/2018 1012   EOSABS 0.2 09/08/2018 1012   EOSABS 0.2 06/11/2018 1531   BASOSABS 0.1  09/08/2018 1012   BASOSABS 0.0 06/11/2018 1531   CMP Latest Ref Rng & Units 09/08/2018 07/30/2018 06/11/2018  Glucose 70 - 99 mg/dL 112(H) 109(H) 103(H)  BUN 8 - 23 mg/dL 26(H) 20 22  Creatinine 0.44 - 1.00 mg/dL 0.64 0.73 0.86  Sodium 135 -  145 mmol/L 139 142 142  Potassium 3.5 - 5.1 mmol/L 4.7 4.5 5.0  Chloride 98 - 111 mmol/L 103 105 104  CO2 22 - 32 mmol/L 27 18(L) 23  Calcium 8.9 - 10.3 mg/dL 10.0 9.1 9.7  Total Protein 6.5 - 8.1 g/dL 7.4 - 7.1  Total Bilirubin 0.3 - 1.2 mg/dL 0.5 - 0.3  Alkaline Phos 38 - 126 U/L 27(L) - 31(L)  AST 15 - 41 U/L 40 - 41(H)  ALT 0 - 44 U/L 55(H) - 62(H)       DIAGNOSTIC IMAGING:  I have independently reviewed the CT scans and discussed with the patient.  I personally performed a face-to-face visit.  All questions were answered to patient's stated satisfaction. Encouraged patient to call with any new concerns or questions before his next visit to the cancer center and we can certain see him sooner, if needed.     ASSESSMENT & PLAN:   History of deep vein thrombosis (DVT) of lower extremity 1.  Left lower extremity DVT: - Patient had a previous DVT in the right leg in 2011.  She was treated with 4 to 6 months of anticoagulation with resolution of the clot. -She was then diagnosed in 2019 with a left lower leg extremity DVT on a Doppler that was done 07/31/2017 that showed extensive left lower extremity DVT extending from the left peroneal vein through the femoral popliteal system. -She denied any recent extensive travel.  She was not on any estrogen supplementation.  She denies a history of miscarriages.  She denies any recent surgeries.  She denies a family history of blood clots, heart attacks, or strokes. -It was deemed to be an unprovoked DVT. -She was placed on Eliquis and reports she is tolerating therapy well.  She will continue Eliquis therapy due to the high risk of subsequent clots. -She has undergone a hypercoagulable evaluation and was  negative for factor V Leiden, PT gene mutation, beta-2 glycoprotein, and negative LA. - Patient had bilateral lower extremity Doppler done on 01/06/2018 which showed no evidence of DVT in lower extremities.  Left lower extremity DVT has resolved. -Labs done on 09/08/2018 showed potassium 4.7, creatinine 0.64, WBC 7.9, hemoglobin 14.1, platelets 207, and LDH 166. -She will follow-up in 5 months with repeat labs.  2.  Pulmonary nodule: - Patient had a CT of the chest done on 09/04/2017 which showed 2 new nodules in the LLL largest measuring 6 mm. - Patient had a repeat CT of the chest in December 2019 which showed nodules are stable at this time. -We will repeat her CT in December 2020.      Orders placed this encounter:  Orders Placed This Encounter  Procedures  . CT CHEST W CONTRAST  . Lactate dehydrogenase  . CBC with Differential/Platelet  . Comprehensive metabolic panel  . VITAMIN D 25 Hydroxy (Vit-D Deficiency, Fractures)  . Vitamin B12     Francene Finders, FNP-C [place] [phone]

## 2018-09-15 NOTE — Assessment & Plan Note (Addendum)
1.  Left lower extremity DVT: - Patient had a previous DVT in the right leg in 2011.  She was treated with 4 to 6 months of anticoagulation with resolution of the clot. -She was then diagnosed in 2019 with a left lower leg extremity DVT on a Doppler that was done 07/31/2017 that showed extensive left lower extremity DVT extending from the left peroneal vein through the femoral popliteal system. -She denied any recent extensive travel.  She was not on any estrogen supplementation.  She denies a history of miscarriages.  She denies any recent surgeries.  She denies a family history of blood clots, heart attacks, or strokes. -It was deemed to be an unprovoked DVT. -She was placed on Eliquis and reports she is tolerating therapy well.  She will continue Eliquis therapy due to the high risk of subsequent clots. -She has undergone a hypercoagulable evaluation and was negative for factor V Leiden, PT gene mutation, beta-2 glycoprotein, and negative LA. - Patient had bilateral lower extremity Doppler done on 01/06/2018 which showed no evidence of DVT in lower extremities.  Left lower extremity DVT has resolved. -Labs done on 09/08/2018 showed potassium 4.7, creatinine 0.64, WBC 7.9, hemoglobin 14.1, platelets 207, and LDH 166. -She will follow-up in 5 months with repeat labs.  2.  Pulmonary nodule: - Patient had a CT of the chest done on 09/04/2017 which showed 2 new nodules in the LLL largest measuring 6 mm. - Patient had a repeat CT of the chest in December 2019 which showed nodules are stable at this time. -We will repeat her CT in December 2020.

## 2018-09-15 NOTE — Progress Notes (Signed)
Incomplete encounter 

## 2018-09-15 NOTE — Patient Instructions (Signed)
[place] at [facility] Discharge Instructions  Follow up in 5 months with labs and CT of Chest   Thank you for choosing [place] at [facility] to provide your oncology and hematology care.  To afford each patient quality time with our provider, please arrive at least 15 minutes before your scheduled appointment time.   If you have a lab appointment with the [facility] please come in thru the  Main Entrance and check in at the main information desk  You need to re-schedule your appointment should you arrive 10 or more minutes late.  We strive to give you quality time with our providers, and arriving late affects you and other patients whose appointments are after yours.  Also, if you no show three or more times for appointments you may be dismissed from the clinic at the providers discretion.     Again, thank you for choosing [facility].  Our hope is that these requests will decrease the amount of time that you wait before being seen by our physicians.       _____________________________________________________________  Should you have questions after your visit to [facility], please contact our office at [phone] between the hours of 8:00 a.m. and 4:30 p.m.  Voicemails left after 4:00 p.m. will not be returned until the following business day.  For prescription refill requests, have your pharmacy contact our office and allow 72 hours.    Cancer Center Support Programs:   > Cancer Support Group  2nd Tuesday of the month 1pm-2pm, Journey Room

## 2018-10-02 ENCOUNTER — Ambulatory Visit (INDEPENDENT_AMBULATORY_CARE_PROVIDER_SITE_OTHER): Payer: Medicare HMO | Admitting: Licensed Clinical Social Worker

## 2018-10-02 DIAGNOSIS — E785 Hyperlipidemia, unspecified: Secondary | ICD-10-CM

## 2018-10-02 DIAGNOSIS — I10 Essential (primary) hypertension: Secondary | ICD-10-CM | POA: Diagnosis not present

## 2018-10-02 DIAGNOSIS — F320 Major depressive disorder, single episode, mild: Secondary | ICD-10-CM

## 2018-10-02 DIAGNOSIS — F411 Generalized anxiety disorder: Secondary | ICD-10-CM

## 2018-10-02 DIAGNOSIS — F4321 Adjustment disorder with depressed mood: Secondary | ICD-10-CM

## 2018-10-02 DIAGNOSIS — R69 Illness, unspecified: Secondary | ICD-10-CM | POA: Diagnosis not present

## 2018-10-02 DIAGNOSIS — Z86718 Personal history of other venous thrombosis and embolism: Secondary | ICD-10-CM

## 2018-10-02 NOTE — Chronic Care Management (AMB) (Addendum)
Care Management Note   Christina Gonzales is a 73 y.o. year old female who is a primary care patient of Sharion Balloon, FNP. The CM team was consulted for assistance with chronic disease management and care coordination.   I reached out to Christina Gonzales by phone today.   Review of patient status, including review of consultants reports, relevant laboratory and other test results, and collaboration with appropriate care team members and the patient's provider was performed as part of comprehensive patient evaluation and provision of chronic care management services.   Social Determinants of Health:risk of tobacco use; risk of depression    Office Visit from 07/30/2018 in [facility]  PHQ-9 Total Score  0     GAD 7 : Generalized Anxiety Score 08/09/2015  Nervous, Anxious, on Edge 3  Control/stop worrying 1  Worry too much - different things 1  Trouble relaxing 2  Restless 1  Easily annoyed or irritable 0  Afraid - awful might happen 0  Total GAD 7 Score 8  Anxiety Difficulty Somewhat difficult   Medications    apixaban (ELIQUIS) 5 MG TABS tablet    cholecalciferol (VITAMIN D) 1000 UNITS tablet    lisinopril (ZESTRIL) 40 MG tablet    Multiple Vitamins tablet    Omega-3 1000 MG CAPS    OVER THE COUNTER MEDICATION    sertraline (ZOLOFT) 50 MG tablet    Goals        . Client stated:  " I want to talk with someone about grief issues and managing grief symptoms" (pt-stated)     Current Barriers:  [name] Grief issues related to past death of her son . Grief issues related to death of her brother 2018/03/22)  Clinical Social Work Clinical Goal(s):  [name] Over the next 30 days, client will work with LCSW to address concerns related to grief issues of client and managing grief issues experienced by client.   Interventions:   LCSW has talked previously with client about relaxation techniques to help client manage grief symptoms faced  Previously talked  with client about grief issues experienced by client  Patient Self Care Activities:  . Self administers medications as prescribed . Attends all scheduled provider appointments . Performs ADL's independently  . Calls provider office as needed to ask medical questions regarding client medical needs.  Plan: Patient will talk  with RN CM Christina Gonzales regarding medication cost issue of client LCSW will call client in next 3 weeks to further assess psychosocial needs of client. Patient will call LCSW as needed to discuss grief management of client Client will attend medical appointments as scheduled LCSW to talk further with client about relaxation techniques to help client manage grief symptoms experienced.  Initial goal documentation      Clientpreviously said shehadstartedwalking againoutdoors.LCSWhastalked with client about exercise and outdoor activities of choice to help manage stress/anxiety. She has no transportation problems. She said she has adequate appetite. Clienthassaid she communicates occasionally with her sister in law. Client said she likes listening to music, reading the Bible to relax. Client has prescribed medications. LCSW previously talked with client about self care activities of choice, of use of relaxation techniques to manage anxiety and of socialization with family and friends as a way to manage stress.LCSW also encouraged [name] to call RNCM as needed to discuss nursing needs of client    Follow Up Plan: LCSW will call client in next 3 weeks to further assess the psychosocial needs  of client  Christina Gonzales.Ilisha Blust MSW, LCSW Licensed Clinical Social Worker [facility] Care Management [phone]  I have reviewed and agree with the above  documentation.   [name], FNP

## 2018-10-02 NOTE — Patient Instructions (Signed)
Licensed Clinical Social Worker Visit Information  Goals we discussed today:  Goals        . Client stated:  " I want to talk with someone about grief issues and managing grief symptoms" (pt-stated)     Current Barriers:  [name] Grief issues related to past death of her son . Grief issues related to death of her brother Mar 26, 2018)  Clinical Social Work Clinical Goal(s):  [name] Over the next 30 days, client will work with LCSW to address concerns related to grief issues of client and managing grief issues experienced by client.   Interventions:   LCSW has talked previously with client about relaxation techniques to help client manage grief symptoms faced  Talked previously with client about grief issues experienced by client  Patient Self Care Activities:  . Self administers medications as prescribed . Attends all scheduled provider appointments . Performs ADL's independently  . Calls provider office as needed to ask medical questions regarding client medical needs.  Plan: Patient will talk  with RN CM [name] regarding medication cost issue of client LCSW will call client in next 3 weeks to further assess psychosocial needs of client. Patient will call LCSW as needed to discuss grief management of client Client will attend medical appointments as scheduled LCSW to talk further with client about relaxation techniques to help client manage grief symptoms experienced.  Initial goal documentation       Materials Provided: No  Follow Up Plan: LCSW to call client in next 3 weeks to assess the psychosocial needs of client  The patient verbalized understanding of instructions provided today and declined a print copy of patient instruction materials.   [name].Rubens Cranston MSW, LCSW Licensed Clinical Social Worker [facility] Care Management [phone]

## 2018-10-26 ENCOUNTER — Ambulatory Visit (INDEPENDENT_AMBULATORY_CARE_PROVIDER_SITE_OTHER): Payer: Medicare HMO | Admitting: Licensed Clinical Social Worker

## 2018-10-26 DIAGNOSIS — I10 Essential (primary) hypertension: Secondary | ICD-10-CM | POA: Diagnosis not present

## 2018-10-26 DIAGNOSIS — F320 Major depressive disorder, single episode, mild: Secondary | ICD-10-CM

## 2018-10-26 DIAGNOSIS — R69 Illness, unspecified: Secondary | ICD-10-CM | POA: Diagnosis not present

## 2018-10-26 DIAGNOSIS — F4321 Adjustment disorder with depressed mood: Secondary | ICD-10-CM

## 2018-10-26 DIAGNOSIS — E785 Hyperlipidemia, unspecified: Secondary | ICD-10-CM

## 2018-10-26 DIAGNOSIS — F411 Generalized anxiety disorder: Secondary | ICD-10-CM

## 2018-10-26 DIAGNOSIS — Z86718 Personal history of other venous thrombosis and embolism: Secondary | ICD-10-CM

## 2018-10-26 NOTE — Chronic Care Management (AMB) (Addendum)
Care Management Note   Christina Gonzales is a 73 y.o. year old female who is a primary care patient of Sharion Balloon, FNP. The CM team was consulted for assistance with chronic disease management and care coordination.   I reached out to Christina Gonzales by phone today.    Review of patient status, including review of consultants reports, relevant laboratory and other test results, and collaboration with appropriate care team members and the patient's provider was performed as part of comprehensive patient evaluation and provision of chronic care management services.   Social Determinants of Health: risk of tobacco use; risk of depression ; risk of social isolation    Office Visit from 07/30/2018 in [place]  PHQ-9 Total Score  0     GAD 7 : Generalized Anxiety Score 08/09/2015  Nervous, Anxious, on Edge 3  Control/stop worrying 1  Worry too much - different things 1  Trouble relaxing 2  Restless 1  Easily annoyed or irritable 0  Afraid - awful might happen 0  Total GAD 7 Score 8  Anxiety Difficulty Somewhat difficult   Medications    apixaban (ELIQUIS) 5 MG TABS tablet    cholecalciferol (VITAMIN D) 1000 UNITS tablet    lisinopril (ZESTRIL) 40 MG tablet    Multiple Vitamins tablet    Omega-3 1000 MG CAPS    OVER THE COUNTER MEDICATION    sertraline (ZOLOFT) 50 MG tablet    Goals        . Client stated:  " I want to talk with someone about grief issues and managing grief symptoms" (pt-stated)     Current Barriers:  [name] Grief issues related to past death of her son . Grief issues related to death of her brother Mar 08, 2018)  Clinical Social Work Clinical Goal(s):  [name] Over the next 30 days, client will work with LCSW to address concerns related to grief issues of client and managing grief issues experienced by client.   Interventions:  Provided counseling support for client LCSW talked with client about relaxation techniques to help  client manage grief symptoms faced  Talked with client about grief issues experienced by client Talked with client about client's scheduled appointments Talked with client about family support network of client  Patient Self Care Activities:  . Self administers medications as prescribed . Attends all scheduled provider appointments . Performs ADL's independently  . Calls provider office as needed to ask medical questions regarding client medical needs.  Plan: Patient will talk  with RN CM [name] regarding medication cost issue of client LCSW will call client in next 3 weeks to further assess psychosocial needs of client. Patient will call LCSW as needed to discuss grief management of client Client will attend medical appointments as scheduled LCSW to talk further with client about relaxation techniques to help client manage grief symptoms experienced.  Initial goal documentation      Clientsaid shecontinues to walk outdoors for exercise and for relaxation purposes. LCSWhastalked with client about exercise and outdoor activities of choice to help manage stress/anxiety. She has no transportation problems. She said she has adequate appetite. Clienthassaid she communicates occasionally with her sister in law. Client said she likes listening to music, reading the Bible to relax. Client has prescribed medications. LCSW talked with client about self care activities of choice and of socialization with family and friends as a way to manage stress.LCSW also encouraged Christina Gonzales to call RNCM as needed to discuss nursing  needs of client  . Client said she would have another test related to DVT in January of 2021. She said she was starting to adjust to death of her brother; she said her family had been very supportive related to her grief issues. She was appreciative of call from LCSW on 10/26/2018  Follow Up Plan: LCSW to call client in next 3 weeks to talk further with client about  relaxation techniques to help client manage grief symptoms experienced.  [name].[name] MSW, LCSW Licensed Clinical Social Worker [facility] [phone]  I have reviewed and agree with the above  documentation.   [name], FNP

## 2018-10-26 NOTE — Patient Instructions (Signed)
Licensed Clinical Social Worker Visit Information  Goals we discussed today:  Goals        . Client stated:  " I want to talk with someone about grief issues and managing grief symptoms" (pt-stated)     Current Barriers:  [name] Grief issues related to past death of her son . Grief issues related to death of her brother March 28, 2018)  Clinical Social Work Clinical Goal(s):  [name] Over the next 30 days, client will work with LCSW to address concerns related to grief issues of client and managing grief issues experienced by client.   Interventions:  Provided counseling support for client LCSW talked with client about relaxation techniques to help client manage grief symptoms faced  Talked with client about grief issues experienced by client  Talked with client about client's scheduled appointments Talked with client about family support network for client  Patient Self Care Activities:  . Self administers medications as prescribed . Attends all scheduled provider appointments . Performs ADL's independently  . Calls provider office as needed to ask medical questions regarding client medical needs.  Plan: Patient will talk  with RN CM [name] regarding medication cost issue of client LCSW will call client in next 3 weeks to further assess psychosocial needs of client. Patient will call LCSW as needed to discuss grief management of client Client will attend medical appointments as scheduled LCSW to talk further with client about relaxation techniques to help client manage grief symptoms experienced.  Initial goal documentation        Materials Provided:   Follow Up Plan: LCSW to call client in next 3 weeks to talk with client about relaxation techniques to help client manage  grief symptoms experienced  The patient verbalized understanding of instructions provided today and declined a print copy of patient instruction materials.   [name].Charissa Knowles MSW, LCSW Licensed Clinical  Social Worker [facility] [phone]

## 2018-11-17 ENCOUNTER — Ambulatory Visit (INDEPENDENT_AMBULATORY_CARE_PROVIDER_SITE_OTHER): Payer: Medicare HMO | Admitting: Licensed Clinical Social Worker

## 2018-11-17 DIAGNOSIS — F411 Generalized anxiety disorder: Secondary | ICD-10-CM

## 2018-11-17 DIAGNOSIS — F320 Major depressive disorder, single episode, mild: Secondary | ICD-10-CM | POA: Diagnosis not present

## 2018-11-17 DIAGNOSIS — E785 Hyperlipidemia, unspecified: Secondary | ICD-10-CM | POA: Diagnosis not present

## 2018-11-17 DIAGNOSIS — Z86718 Personal history of other venous thrombosis and embolism: Secondary | ICD-10-CM

## 2018-11-17 DIAGNOSIS — F4321 Adjustment disorder with depressed mood: Secondary | ICD-10-CM

## 2018-11-17 DIAGNOSIS — I10 Essential (primary) hypertension: Secondary | ICD-10-CM

## 2018-11-17 DIAGNOSIS — R69 Illness, unspecified: Secondary | ICD-10-CM | POA: Diagnosis not present

## 2018-11-17 NOTE — Chronic Care Management (AMB) (Addendum)
  Care Management Note   Christina Gonzales is a 73 y.o. year old female who is a primary care patient of Sharion Balloon, FNP. The CM team was consulted for assistance with chronic disease management and care coordination.   I reached out to [name] by phone today.  . Review of patient status, including review of consultants reports, relevant laboratory and other test results, and collaboration with appropriate care team members and the patient's provider was performed as part of a comprehensive patient evaluation and provision of chronic care management services.   Social Determinants of Health: risk of tobacco use; risk of depression    Office Visit from 07/30/2018 in [place]  PHQ-9 Total Score  0     GAD 7 : Generalized Anxiety Score 08/09/2015  Nervous, Anxious, on Edge 3  Control/stop worrying 1  Worry too much - different things 1  Trouble relaxing 2  Restless 1  Easily annoyed or irritable 0  Afraid - awful might happen 0  Total GAD 7 Score 8  Anxiety Difficulty Somewhat difficult   Medications    apixaban (ELIQUIS) 5 MG TABS tablet    cholecalciferol (VITAMIN D) 1000 UNITS tablet    lisinopril (ZESTRIL) 40 MG tablet    Multiple Vitamins tablet    Omega-3 1000 MG CAPS    OVER THE COUNTER MEDICATION    sertraline (ZOLOFT) 50 MG tablet    Goals        . Client stated:  " I want to talk with someone about grief issues and managing grief symptoms" (pt-stated)     Current Barriers:  [name] Grief issues related to past death of her son . Grief issues related to death of her brother 25-Mar-2018)  Clinical Social Work Clinical Goal(s):  [name] Over the next 30 days, client will work with LCSW to address concerns related to grief issues of client and managing grief issues experienced by client.   Interventions:  Talked with client about pain issues of client LCSW has talked previously with client about relaxation techniques to help  client manage grief symptoms faced  LCSW has talked previously with client about grief issues experienced by client Talked with client about her current insurance  Patient Self Care Activities:  . Self administers medications as prescribed . Attends all scheduled provider appointments . Performs ADL's independently  . Calls provider office as needed to ask medical questions regarding client medical needs.  Plan: Patient will talk  with RN CM [name] regarding medication cost issue of client LCSW will call client in next 3 weeks to further assess psychosocial needs of client. Patient will call LCSW as needed to discuss grief management of client Client will attend medical appointments as scheduled LCSW to talk further with client about relaxation techniques to help client manage grief symptoms experienced.  Initial goal documentation       Follow Up Plan: LCSW to call client in next 3 weeks to further assess the psychosocial needs of client at that time  [name].Zadiel Leyh MSW, LCSW   I have reviewed and agree with the above  documentation.   [name], FNP   Licensed Clinical Social Worker [facility] [phone]

## 2018-11-17 NOTE — Patient Instructions (Addendum)
Licensed Clinical Social Worker Visit Information  Goals we discussed today:  Goals    . Client stated:  " I want to talk with someone about grief issues and managing grief symptoms" (pt-stated)     Current Barriers:  [name] Grief issues related to past death of her son . Grief issues related to death of her brother 2018-03-06)  Clinical Social Work Clinical Goal(s):  [name] Over the next 30 days, client will work with LCSW to address concerns related to grief issues of client and managing grief issues experienced by client.   Interventions:   LCSW has talked previously with client about relaxation techniques to help client manage grief symptoms faced  Talked with client previously about grief issues experienced by client Talked with client about pain issues of client Talked with client about her current insurance  Patient Self Care Activities:  . Self administers medications as prescribed . Attends all scheduled provider appointments . Performs ADL's independently  . Calls provider office as needed to ask medical questions regarding client medical needs.  Plan: Patient will talk  with RN CM [name] regarding medication cost issue of client LCSW will call client in next 3 weeks to further assess psychosocial needs of client. Patient will call LCSW as needed to discuss grief management of client Client will attend medical appointments as scheduled LCSW to talk further with client about relaxation techniques to help client manage grief symptoms experienced.  Initial goal documentation        Materials Provided:  No  Follow Up Plan: LCSW to call client in next 3 weeks to assess the psychosocial needs of client at that time  The patient verbalized understanding of instructions provided today and declined a print copy of patient instruction materials.   [name].Nettie Cromwell MSW, LCSW Licensed Clinical Social Worker [facility]  [facility] [phone]

## 2018-11-19 DIAGNOSIS — R69 Illness, unspecified: Secondary | ICD-10-CM | POA: Diagnosis not present

## 2018-12-08 ENCOUNTER — Ambulatory Visit (INDEPENDENT_AMBULATORY_CARE_PROVIDER_SITE_OTHER): Payer: Medicare HMO | Admitting: Licensed Clinical Social Worker

## 2018-12-08 DIAGNOSIS — E785 Hyperlipidemia, unspecified: Secondary | ICD-10-CM | POA: Diagnosis not present

## 2018-12-08 DIAGNOSIS — I10 Essential (primary) hypertension: Secondary | ICD-10-CM | POA: Diagnosis not present

## 2018-12-08 DIAGNOSIS — R69 Illness, unspecified: Secondary | ICD-10-CM | POA: Diagnosis not present

## 2018-12-08 DIAGNOSIS — F411 Generalized anxiety disorder: Secondary | ICD-10-CM

## 2018-12-08 DIAGNOSIS — F320 Major depressive disorder, single episode, mild: Secondary | ICD-10-CM | POA: Diagnosis not present

## 2018-12-08 DIAGNOSIS — F4321 Adjustment disorder with depressed mood: Secondary | ICD-10-CM

## 2018-12-08 DIAGNOSIS — Z86718 Personal history of other venous thrombosis and embolism: Secondary | ICD-10-CM

## 2018-12-08 NOTE — Patient Instructions (Addendum)
Licensed Clinical Social Worker Visit Information  Goals we discussed today:  Goals        . Client stated:  " I want to talk with someone about grief issues and managing grief symptoms" (pt-stated)     Current Barriers:  [name] Grief issues related to past death of her son . Grief issues related to death of her brother 2018-03-19)  Clinical Social Work Clinical Goal(s):  [name] Over the next 30 days, client will work with LCSW to address concerns related to grief issues of client and managing grief issues experienced by client.   Interventions:   Provided counseling support for client LCSW talked with client about relaxation techniques to help client manage grief symptoms faced (listens to music, prays,read Bible,walking) Talked with client about grief issues experienced by client Talked with client about social support network (client said she talks with family or friends via phone)  Patient Self Care Activities:  . Self administers medications as prescribed . Attends all scheduled provider appointments . Performs ADL's independently  . Calls provider office as needed to ask medical questions regarding client medical needs.  Plan: Patient will talk  with RN CM [name] regarding medication cost issue of client LCSW will call client in next 3 weeks to further assess psychosocial needs of client. Patient will call LCSW as needed to discuss grief management of client Client will attend medical appointments as scheduled LCSW to talk further with client about relaxation techniques to help client manage grief symptoms experienced.  Initial goal documentation        Materials Provided: No  Follow Up Plan: LCSW will call client in next 3 weeks to further assess the psychosocial  needs of client at that time  The patient verbalized understanding of instructions provided today and declined a print copy of patient instruction materials.   [name].Lennyx Verdell MSW, LCSW Licensed  Clinical Social Worker [facility] [phone]

## 2018-12-08 NOTE — Chronic Care Management (AMB) (Addendum)
  Care Management Note   Christina Gonzales is a 73 y.o. year old female who is a primary care patient of Sharion Balloon, FNP. The CM team was consulted for assistance with chronic disease management and care coordination.   I reached out to Christina Gonzales by phone today.    Review of patient status, including review of consultants reports, relevant laboratory and other test results, and collaboration with appropriate care team members and the patient's provider was performed as part of comprehensive patient evaluation and provision of chronic care management services.   Social determinants of health: risk of tobacco use; risk of stress; experiences grief issues currently    Office Visit from 07/30/2018 in [place]  PHQ-9 Total Score  0     GAD 7 : Generalized Anxiety Score 08/09/2015  Nervous, Anxious, on Edge 3  Control/stop worrying 1  Worry too much - different things 1  Trouble relaxing 2  Restless 1  Easily annoyed or irritable 0  Afraid - awful might happen 0  Total GAD 7 Score 8  Anxiety Difficulty Somewhat difficult   Medications    apixaban (ELIQUIS) 5 MG TABS tablet    cholecalciferol (VITAMIN D) 1000 UNITS tablet    lisinopril (ZESTRIL) 40 MG tablet    Multiple Vitamins tablet    Omega-3 1000 MG CAPS    OVER THE COUNTER MEDICATION    sertraline (ZOLOFT) 50 MG tablet    Goals        . Client stated:  " I want to talk with someone about grief issues and managing grief symptoms" (pt-stated)     Current Barriers:  [name] Grief issues related to past death of her son . Grief issues related to death of her brother 03-28-2018)  Clinical Social Work Clinical Goal(s):  [name] Over the next 30 days, client will work with LCSW to address concerns related to grief issues of client and managing grief issues experienced by client.   Interventions:  Provided counseling support for client LCSW talked with client about relaxation techniques to  help client manage grief symptoms faced (listens to music, prays,read Bible,walking) Talked with client about grief issues experienced by client Talked with client about social support network (client said she talks with family or friends via phone)  Patient Self Care Activities:  . Self administers medications as prescribed . Attends all scheduled provider appointments . Performs ADL's independently  . Calls provider office as needed to ask medical questions regarding client medical needs.  Plan: Patient will talk  with RN CM [name] regarding medication cost issue of client LCSW will call client in next 3 weeks to further assess psychosocial needs of client. Patient will call LCSW as needed to discuss grief management of client Client will attend medical appointments as scheduled LCSW to talk further with client about relaxation techniques to help client manage grief symptoms experienced.  Initial goal documentation      Follow Up Plan: LCSW to call client in next 3 weeks to further assess the psychosocial needs of client at that time  [name].Harini Dearmond MSW, LCSW Licensed Clinical Social Worker [facility] [phone]  I have reviewed and agree with the above  documentation.   [name], FNP

## 2018-12-30 ENCOUNTER — Telehealth: Payer: Self-pay

## 2019-01-22 ENCOUNTER — Ambulatory Visit (INDEPENDENT_AMBULATORY_CARE_PROVIDER_SITE_OTHER): Payer: Medicare HMO | Admitting: Licensed Clinical Social Worker

## 2019-01-22 DIAGNOSIS — E785 Hyperlipidemia, unspecified: Secondary | ICD-10-CM

## 2019-01-22 DIAGNOSIS — I10 Essential (primary) hypertension: Secondary | ICD-10-CM | POA: Diagnosis not present

## 2019-01-22 DIAGNOSIS — Z86718 Personal history of other venous thrombosis and embolism: Secondary | ICD-10-CM

## 2019-01-22 DIAGNOSIS — F411 Generalized anxiety disorder: Secondary | ICD-10-CM

## 2019-01-22 NOTE — Patient Instructions (Addendum)
Licensed Clinical Social Worker Visit Information  Goals we discussed today:  Goals Addressed            This Visit's Progress   . Client stated:  " I want to talk with someone about grief issues and managing grief symptoms" (pt-stated)       Current Barriers:  [name] Grief issues related to past death of her son . Grief issues related to death of her brother 04-Mar-2018) . Mental health issues in client with Chronic Diagnoses of GAD, HTN, Hyperlipidemia, Hx of DVT  Clinical Social Work Clinical Goal(s):  [name] Over the next 30 days, client will work with LCSW to address concerns related to grief issues of client and managing grief issues experienced by client.   Interventions:   Provided counseling support for client LCSW talked with client about relaxation techniques to help client manage grief symptoms faced (enjoys decorating for Christmas, enjoys watching TV shows on Christmas, listening to music) Talked with client about grief issues experienced by client Talked with client about her social support network (family and friends)  Patient Self Care Activities:  . Self administers medications as prescribed . Attends all scheduled provider appointments . Performs ADL's independently  . Calls provider office as needed to ask medical questions regarding client medical needs.  Plan: Patient will talk  with RN CM [name] regarding medication cost issue of client LCSW will call client in next 4 weeks to further assess psychosocial needs of client. Patient will call LCSW as needed to discuss grief management of client Client will attend medical appointments as scheduled LCSW to talk further with client about relaxation techniques to help client manage grief symptoms experienced.  Initial goal documentation        Materials Provided: No   Follow Up Plan: LCSW to call client in next 4 weeks to assess the psychosocial needs of client at that time  The patient verbalized  understanding of instructions provided today and declined a print copy of patient instruction materials.   [name].Payslie Mccaig MSW, LCSW Licensed Clinical Social Worker [facility] [phone]

## 2019-01-22 NOTE — Chronic Care Management (AMB) (Addendum)
  Care Management Note   Christina Gonzales is a 73 y.o. year old female who is a primary care patient of Sharion Balloon, FNP. The CM team was consulted for assistance with chronic disease management and care coordination.   I reached out to [name] by phone today.   Review of patient status, including review of consultants reports, relevant laboratory and other test results, and collaboration with appropriate care team members and the patient's provider was performed as part of comprehensive patient evaluation and provision of chronic care management services.   Social determinants of health: risk of tobacco use; risk of depression    Office Visit from 07/30/2018 in [place]  PHQ-9 Total Score  0       GAD 7 : Generalized Anxiety Score 08/09/2015  Nervous, Anxious, on Edge 3  Control/stop worrying 1  Worry too much - different things 1  Trouble relaxing 2  Restless 1  Easily annoyed or irritable 0  Afraid - awful might happen 0  Total GAD 7 Score 8  Anxiety Difficulty Somewhat difficult   Medications    (very important)  New medications from outside sources are available for reconciliation   apixaban (ELIQUIS) 5 MG TABS tablet cholecalciferol (VITAMIN D) 1000 UNITS tablet lisinopril (ZESTRIL) 40 MG tablet Multiple Vitamins tablet Omega-3 1000 MG CAPS OVER THE COUNTER MEDICATION sertraline (ZOLOFT) 50 MG tablet  Goals Addressed             This Visit's Progress    Client stated:  " I want to talk with someone about grief issues and managing grief symptoms" (pt-stated)       Current Barriers:  Grief issues related to past death of her son Grief issues related to death of her brother 2018/03/03) Mental health issues in client with Chronic Diagnoses of GAD, HTN, Hyperlipidemia, Hx of DVT  Clinical Social Work Clinical Goal(s):  Over the next 30 days, client will work with LCSW to address concerns related to grief issues of  client and managing grief issues experienced by client.   Interventions:  Provided counseling support for client LCSW talked with client about relaxation techniques to help client manage grief symptoms faced (enjoys decorating for Christmas, enjoys watching TV shows on Christmas, listening to music) Talked with client about grief issues experienced by client Talked with client about her social support network (family and friends)  Patient Self Care Activities:  Self administers medications as prescribed Attends all scheduled provider appointments Performs ADL's independently  Calls provider office as needed to ask medical questions regarding client medical needs.  Plan: Patient will talk  with RN CM [name] regarding medication cost issue of client LCSW will call client in next 4 weeks to further assess psychosocial needs of client. Patient will call LCSW as needed to discuss grief management of client Client will attend medical appointments as scheduled LCSW to talk further with client about relaxation techniques to help client manage grief symptoms experienced.  Initial goal documentation       Follow Up Plan: LCSW to call client in next 4 weeks to assess the psychosocial needs of client at that time  [name].Iain Sawchuk MSW, LCSW Licensed Clinical Social Worker [facility] [phone]  I have reviewed and agree with the above  documentation.   [name], FNP

## 2019-02-16 ENCOUNTER — Inpatient Hospital Stay (HOSPITAL_COMMUNITY): Payer: Medicare HMO | Attending: Hematology

## 2019-02-16 ENCOUNTER — Ambulatory Visit (HOSPITAL_COMMUNITY)
Admission: RE | Admit: 2019-02-16 | Discharge: 2019-02-16 | Disposition: A | Discharge status: Home / Self Care | Payer: Medicare HMO | Source: Ambulatory Visit | Attending: Nurse Practitioner | Admitting: Nurse Practitioner

## 2019-02-16 ENCOUNTER — Other Ambulatory Visit: Payer: Self-pay

## 2019-02-16 DIAGNOSIS — Z86718 Personal history of other venous thrombosis and embolism: Secondary | ICD-10-CM

## 2019-02-16 DIAGNOSIS — R911 Solitary pulmonary nodule: Secondary | ICD-10-CM | POA: Insufficient documentation

## 2019-02-16 DIAGNOSIS — Z801 Family history of malignant neoplasm of trachea, bronchus and lung: Secondary | ICD-10-CM | POA: Diagnosis not present

## 2019-02-16 DIAGNOSIS — E538 Deficiency of other specified B group vitamins: Secondary | ICD-10-CM | POA: Insufficient documentation

## 2019-02-16 DIAGNOSIS — E559 Vitamin D deficiency, unspecified: Secondary | ICD-10-CM | POA: Insufficient documentation

## 2019-02-16 DIAGNOSIS — Z7901 Long term (current) use of anticoagulants: Secondary | ICD-10-CM | POA: Insufficient documentation

## 2019-02-16 DIAGNOSIS — R918 Other nonspecific abnormal finding of lung field: Secondary | ICD-10-CM | POA: Diagnosis not present

## 2019-02-16 LAB — CBC WITH DIFFERENTIAL/PLATELET
Abs Immature Granulocytes: 0.02 10*3/uL (ref 0.00–0.07)
Basophils Absolute: 0 10*3/uL (ref 0.0–0.1)
Basophils Relative: 1 %
Eosinophils Absolute: 0.2 10*3/uL (ref 0.0–0.5)
Eosinophils Relative: 3 %
HCT: 43.7 % (ref 36.0–46.0)
Hemoglobin: 14.1 g/dL (ref 12.0–15.0)
Immature Granulocytes: 0 %
Lymphocytes Relative: 37 %
Lymphs Abs: 2.8 10*3/uL (ref 0.7–4.0)
MCH: 29.5 pg (ref 26.0–34.0)
MCHC: 32.3 g/dL (ref 30.0–36.0)
MCV: 91.4 fL (ref 80.0–100.0)
Monocytes Absolute: 0.6 10*3/uL (ref 0.1–1.0)
Monocytes Relative: 8 %
Neutro Abs: 3.8 10*3/uL (ref 1.7–7.7)
Neutrophils Relative %: 51 %
Platelets: 215 10*3/uL (ref 150–400)
RBC: 4.78 MIL/uL (ref 3.87–5.11)
RDW: 12.5 % (ref 11.5–15.5)
WBC: 7.4 10*3/uL (ref 4.0–10.5)
nRBC: 0 % (ref 0.0–0.2)

## 2019-02-16 LAB — COMPREHENSIVE METABOLIC PANEL
ALT: 72 U/L — ABNORMAL HIGH (ref 0–44)
AST: 61 U/L — ABNORMAL HIGH (ref 15–41)
Albumin: 3.9 g/dL (ref 3.5–5.0)
Alkaline Phosphatase: 27 U/L — ABNORMAL LOW (ref 38–126)
Anion gap: 8 (ref 5–15)
BUN: 19 mg/dL (ref 8–23)
CO2: 27 mmol/L (ref 22–32)
Calcium: 9.7 mg/dL (ref 8.9–10.3)
Chloride: 103 mmol/L (ref 98–111)
Creatinine, Ser: 0.63 mg/dL (ref 0.44–1.00)
GFR calc Af Amer: >60 mL/min (ref >60)
GFR calc non Af Amer: >60 mL/min (ref >60)
Glucose, Bld: 100 mg/dL — ABNORMAL HIGH (ref 70–99)
Potassium: 4.5 mmol/L (ref 3.5–5.1)
Sodium: 138 mmol/L (ref 135–145)
Total Bilirubin: 0.5 mg/dL (ref 0.3–1.2)
Total Protein: 7.6 g/dL (ref 6.5–8.1)

## 2019-02-16 LAB — VITAMIN B12: Vitamin B-12: 123 pg/mL — ABNORMAL LOW (ref 180–914)

## 2019-02-16 LAB — VITAMIN D 25 HYDROXY (VIT D DEFICIENCY, FRACTURES): Vit D, 25-Hydroxy: 19.99 ng/mL — ABNORMAL LOW (ref 30–100)

## 2019-02-16 LAB — LACTATE DEHYDROGENASE: LDH: 185 U/L (ref 98–192)

## 2019-02-16 MED ORDER — IOHEXOL 300 MG/ML  SOLN
75.0000 mL | Freq: Once | INTRAMUSCULAR | Status: AC | PRN
  Administered 2019-02-16: 75 mL via INTRAVENOUS

## 2019-02-18 ENCOUNTER — Ambulatory Visit (HOSPITAL_COMMUNITY): Payer: Medicare HMO | Admitting: Nurse Practitioner

## 2019-02-19 ENCOUNTER — Ambulatory Visit (INDEPENDENT_AMBULATORY_CARE_PROVIDER_SITE_OTHER): Payer: Medicare HMO | Admitting: Licensed Clinical Social Worker

## 2019-02-19 DIAGNOSIS — Z86718 Personal history of other venous thrombosis and embolism: Secondary | ICD-10-CM

## 2019-02-19 DIAGNOSIS — I10 Essential (primary) hypertension: Secondary | ICD-10-CM

## 2019-02-19 DIAGNOSIS — E785 Hyperlipidemia, unspecified: Secondary | ICD-10-CM | POA: Diagnosis not present

## 2019-02-19 DIAGNOSIS — F411 Generalized anxiety disorder: Secondary | ICD-10-CM

## 2019-02-19 NOTE — Chronic Care Management (AMB) (Addendum)
  Care Management Note   Christina Gonzales is a 74 y.o. year old female who is a primary care patient of Sharion Balloon, FNP. The CM team was consulted for assistance with chronic disease management and care coordination.   I reached out to [employer], spouse, by phone today.    Review of patient status, including review of consultants reports, relevant laboratory and other test results, and collaboration with appropriate care team members and the patient's provider was performed as part of comprehensive patient evaluation and provision of chronic care management services.   Social Determinants of Health:risk for tobacco use; risk for depression    Office Visit from 07/30/2018 in [place]  PHQ-9 Total Score  0      GAD 7 : Generalized Anxiety Score 08/09/2015  Nervous, Anxious, on Edge 3  Control/stop worrying 1  Worry too much - different things 1  Trouble relaxing 2  Restless 1  Easily annoyed or irritable 0  Afraid - awful might happen 0  Total GAD 7 Score 8  Anxiety Difficulty Somewhat difficult   Medications    (very important)  New medications from outside sources are available for reconciliation   apixaban (ELIQUIS) 5 MG TABS tablet cholecalciferol (VITAMIN D) 1000 UNITS tablet lisinopril (ZESTRIL) 40 MG tablet Multiple Vitamins tablet Omega-3 1000 MG CAPS OVER THE COUNTER MEDICATION sertraline (ZOLOFT) 50 MG tablet  Goals          Client stated:  " I want to talk with someone about grief issues and managing grief symptoms" (pt-stated)     Current Barriers:  Grief issues related to past death of her son Grief issues related to death of her brother 2018/03/05) Mental health issues in client with Chronic Diagnoses of GAD, HTN, Hyperlipidemia, Hx of DVT  Clinical Social Work Clinical Goal(s):  Over the next 30 days, client will work with LCSW to address concerns related to grief issues of client and  managing grief issues experienced by client.   Interventions:  Previously provided counseling support for client LCSW talked previously with client about relaxation techniques to help client manage grief symptoms faced (enjoys decorating for Christmas, enjoys watching TV shows on Christmas, listening to music) Previously talked with client about grief issues experienced by client Previously talked with client about her social support network (family and friends)  Patient Self Care Activities:  Self administers medications as prescribed Attends all scheduled provider appointments Performs ADL's independently  Calls provider office as needed to ask medical questions regarding client medical needs.  Plan: Patient will talk  with RN CM [name] regarding medication cost issue of client LCSW will call client in next 4 weeks to further assess psychosocial needs of client. Patient will call LCSW as needed to discuss grief management of client Client will attend medical appointments as scheduled LCSW to talk further with client about relaxation techniques to help client manage grief symptoms experienced.  Initial goal documentation       Follow Up Plan: LCSW will call client in next 4 weeks to assess the psychosocial needs of client at that time  [name].Chevette Fee MSW, LCSW Licensed Clinical Social Worker [facility] [phone]  I have reviewed and agree with the above documentation.   [name], FNP

## 2019-02-19 NOTE — Patient Instructions (Addendum)
Licensed Clinical Social Worker Visit Information  Goals we discussed today:  Goals        . Client stated:  " I want to talk with someone about grief issues and managing grief symptoms" (pt-stated)     Current Barriers:  [name] Grief issues related to past death of her son . Grief issues related to death of her brother 2018-03-22) . Mental health issues in client with Chronic Diagnoses of GAD, HTN, Hyperlipidemia, Hx of DVT  Clinical Social Work Clinical Goal(s):  [name] Over the next 30 days, client will work with LCSW to address concerns related to grief issues of client and managing grief issues experienced by client.   Interventions:   Previously provided counseling support for client  LCSW talked previously with client about relaxation techniques to help client manage grief symptoms faced (enjoys decorating for Christmas, enjoys watching TV shows on Christmas, listening to music)  Previously talked with client about grief issues experienced by client  Previously talked with client about her social support network (family and friends)  Patient Self Care Activities:  . Self administers medications as prescribed . Attends all scheduled provider appointments . Performs ADL's independently  . Calls provider office as needed to ask medical questions regarding client medical needs.  Plan: Patient will talk  with RN CM [name] regarding medication cost issue of client LCSW will call client in next 3 weeks to further assess psychosocial needs of client. Patient will call LCSW as needed to discuss grief management of client Client will attend medical appointments as scheduled LCSW to talk further with client about relaxation techniques to help client manage grief symptoms experienced.  Initial goal documentation      Materials Provided: No  Follow Up Plan: LCSW will call client in next 4 weeks to further assess the psychosocial  needs of client at that time  The patient  verbalized understanding of instructions provided today and declined a print copy of patient instruction materials.   [name].Lindamarie Maclachlan MSW, LCSW Licensed Clinical Social Worker [facility] [phone]

## 2019-03-02 ENCOUNTER — Other Ambulatory Visit: Payer: Self-pay

## 2019-03-02 ENCOUNTER — Inpatient Hospital Stay (HOSPITAL_COMMUNITY): Payer: Medicare HMO | Admitting: Nurse Practitioner

## 2019-03-02 ENCOUNTER — Encounter (HOSPITAL_COMMUNITY): Payer: Self-pay | Admitting: Nurse Practitioner

## 2019-03-02 VITALS — BP 164/63 | HR 74 | Temp 97.7°F | Resp 16 | Wt 184.2 lb

## 2019-03-02 DIAGNOSIS — E538 Deficiency of other specified B group vitamins: Secondary | ICD-10-CM | POA: Diagnosis not present

## 2019-03-02 DIAGNOSIS — E559 Vitamin D deficiency, unspecified: Secondary | ICD-10-CM

## 2019-03-02 DIAGNOSIS — Z7901 Long term (current) use of anticoagulants: Secondary | ICD-10-CM | POA: Diagnosis not present

## 2019-03-02 DIAGNOSIS — Z86718 Personal history of other venous thrombosis and embolism: Secondary | ICD-10-CM

## 2019-03-02 DIAGNOSIS — Z801 Family history of malignant neoplasm of trachea, bronchus and lung: Secondary | ICD-10-CM | POA: Diagnosis not present

## 2019-03-02 DIAGNOSIS — R911 Solitary pulmonary nodule: Secondary | ICD-10-CM | POA: Diagnosis not present

## 2019-03-02 MED ORDER — ERGOCALCIFEROL 1.25 MG (50000 UT) PO CAPS: 50000.0000 [IU] | ORAL_CAPSULE | ORAL | 4 refills | Status: DC

## 2019-03-02 NOTE — Progress Notes (Signed)
[address], [place] [id number]   CLINIC:  Medical Oncology/Hematology  PCP:  Sharion Balloon, [address] [place] [address] [phone]   REASON FOR VISIT: Follow-up for recurrent DVTs  CURRENT THERAPY: Lifelong Eliquis therapy   INTERVAL HISTORY:  [name] 74 y.o. female returns for routine follow-up for recurrent DVTs.  Patient reports she has had no further DVTs or hospitalizations.  She remains taking her Eliquis daily.  She has no side effects from the medication and is tolerating it well.  She denies any bleeding or bruising episodes. Denies any nausea, vomiting, or diarrhea. Denies any new pains. Had not noticed any recent bleeding such as epistaxis, hematuria or hematochezia. Denies recent chest pain on exertion, shortness of breath on minimal exertion, pre-syncopal episodes, or palpitations. Denies any numbness or tingling in hands or feet. Denies any recent fevers, infections, or recent hospitalizations. Patient reports appetite at 100% and energy level at 100%.  She is eating well maintain her weight at this time.    REVIEW OF SYSTEMS:  Review of Systems  Psychiatric/Behavioral: Positive for sleep disturbance.  All other systems reviewed and are negative.    PAST MEDICAL/SURGICAL HISTORY:  Past Medical History:  Diagnosis Date  . Anxiety   . Hyperlipidemia   . Seizures ([place])    Past Surgical History:  Procedure Laterality Date  . TUMOR EXCISION     Behind left eye     SOCIAL HISTORY:  Social History   Socioeconomic History  . Marital status: Married    Spouse name: Not on file  . Number of children: Not on file  . Years of education: Not on file  . Highest education level: Not on file  Occupational History  . Not on file  Tobacco Use  . Smoking status: Never Smoker  . Smokeless tobacco: Never Used  Substance and Sexual Activity  . Alcohol use: No  . Drug use: No  . Sexual activity: Not  Currently  Other Topics Concern  . Not on file  Social History Narrative  . Not on file   Social Determinants of Health   Financial Resource Strain:   . Difficulty of Paying Living Expenses: Not on file  Food Insecurity:   . Worried About Charity fundraiser in the Last Year: Not on file  . Ran Out of Food in the Last Year: Not on file  Transportation Needs:   . Lack of Transportation (Medical): Not on file  . Lack of Transportation (Non-Medical): Not on file  Physical Activity:   . Days of Exercise per Week: Not on file  . Minutes of Exercise per Session: Not on file  Stress:   . Feeling of Stress : Not on file  Social Connections:   . Frequency of Communication with Friends and Family: Not on file  . Frequency of Social Gatherings with Friends and Family: Not on file  . Attends Religious Services: Not on file  . Active Member of Clubs or Organizations: Not on file  . Attends Archivist Meetings: Not on file  . Marital Status: Not on file  Intimate Partner Violence:   . Fear of Current or Ex-Partner: Not on file  . Emotionally Abused: Not on file  . Physically Abused: Not on file  . Sexually Abused: Not on file    FAMILY HISTORY:  Family History  Problem Relation Age of Onset  . Cancer Mother  lung    CURRENT MEDICATIONS:  Outpatient Encounter Medications as of 03/02/2019  Medication Sig Note  . apixaban (ELIQUIS) 5 MG TABS tablet Take 1 tablet (5 mg total) by mouth 2 (two) times daily.   [name] lisinopril (ZESTRIL) 40 MG tablet Take 1 tablet (40 mg total) by mouth daily.   . Omega-3 1000 MG CAPS Take 1 g by mouth. Reported on 05/26/2015 04/15/2013: Received from: [facility]  . OVER THE COUNTER MEDICATION Tumeric for inflammation.   . cholecalciferol (VITAMIN D) 1000 UNITS tablet Take 1,000 Units by mouth daily. Reported on 05/26/2015   . ergocalciferol (VITAMIN D2) 1.25 MG (50000 UT) capsule Take 1 capsule (50,000 Units total) by  mouth once a week.   . Multiple Vitamins tablet Take 1 tablet by mouth. Reported on 05/26/2015 04/15/2013: Received from: [facility]  . [DISCONTINUED] sertraline (ZOLOFT) 50 MG tablet Take 1 tablet (50 mg total) by mouth daily. (Patient not taking: Reported on 09/03/2018)    No facility-administered encounter medications on file as of 03/02/2019.    ALLERGIES:  Allergies  Allergen Reactions  . Heparin Nausea And Vomiting  . Warfarin Other (See Comments)    unknown  . Penicillins Rash     PHYSICAL EXAM:  ECOG Performance status: 1  Vitals:   03/02/19 1000  BP: (!) 164/63  Pulse: 74  Resp: 16  Temp: 97.7 F (36.5 C)  SpO2: 97%   Filed Weights   03/02/19 1000  Weight: 184 lb 4 oz (83.6 kg)    Physical Exam Constitutional:      Appearance: Normal appearance. She is normal weight.  Cardiovascular:     Rate and Rhythm: Normal rate and regular rhythm.     Heart sounds: Normal heart sounds.  Pulmonary:     Effort: Pulmonary effort is normal.     Breath sounds: Normal breath sounds.  Abdominal:     General: Bowel sounds are normal.     Palpations: Abdomen is soft.  Musculoskeletal:        General: Normal range of motion.  Skin:    General: Skin is warm.  Neurological:     Mental Status: She is alert and oriented to person, place, and time. Mental status is at baseline.  Psychiatric:        Mood and Affect: Mood normal.        Behavior: Behavior normal.        Thought Content: Thought content normal.        Judgment: Judgment normal.      LABORATORY DATA:  I have reviewed the labs as listed.  CBC    Component Value Date/Time   WBC 7.4 02/16/2019 1002   RBC 4.78 02/16/2019 1002   HGB 14.1 02/16/2019 1002   HGB 13.9 06/11/2018 1531   HCT 43.7 02/16/2019 1002   HCT 41.3 06/11/2018 1531   PLT 215 02/16/2019 1002   PLT 201 06/11/2018 1531   MCV 91.4 02/16/2019 1002   MCV 89 06/11/2018 1531   MCH 29.5 02/16/2019 1002   MCHC 32.3  02/16/2019 1002   RDW 12.5 02/16/2019 1002   RDW 13.1 06/11/2018 1531   LYMPHSABS 2.8 02/16/2019 1002   LYMPHSABS 3.1 06/11/2018 1531   MONOABS 0.6 02/16/2019 1002   EOSABS 0.2 02/16/2019 1002   EOSABS 0.2 06/11/2018 1531   BASOSABS 0.0 02/16/2019 1002   BASOSABS 0.0 06/11/2018 1531   CMP Latest Ref Rng & Units 02/16/2019 09/08/2018 07/30/2018  Glucose  70 - 99 mg/dL 100(H) 112(H) 109(H)  BUN 8 - 23 mg/dL 19 26(H) 20  Creatinine 0.44 - 1.00 mg/dL 0.63 0.64 0.73  Sodium 135 - 145 mmol/L 138 139 142  Potassium 3.5 - 5.1 mmol/L 4.5 4.7 4.5  Chloride 98 - 111 mmol/L 103 103 105  CO2 22 - 32 mmol/L 27 27 18(L)  Calcium 8.9 - 10.3 mg/dL 9.7 10.0 9.1  Total Protein 6.5 - 8.1 g/dL 7.6 7.4 -  Total Bilirubin 0.3 - 1.2 mg/dL 0.5 0.5 -  Alkaline Phos 38 - 126 U/L 27(L) 27(L) -  AST 15 - 41 U/L 61(H) 40 -  ALT 0 - 44 U/L 72(H) 55(H) -    DIAGNOSTIC IMAGING:  I have independently reviewed the CTscans and discussed with the patient.  I personally performed a face-to-face visit.  All questions were answered to patient's stated satisfaction. Encouraged patient to call with any new concerns or questions before his next visit to the cancer center and we can certain see him sooner, if needed.     ASSESSMENT & PLAN:   History of deep vein thrombosis (DVT) of lower extremity 1.  Left lower extremity DVT: - Patient had a previous DVT in the right leg in 2011.  She was treated with 4 to 6 months of anticoagulation with resolution of the clot. -She was then diagnosed in 2019 with a left lower leg extremity DVT on a Doppler that was done 07/31/2017 that showed extensive left lower extremity DVT extending from the left peroneal vein through the femoral popliteal system. -She denied any recent extensive travel.  She was not on any estrogen supplementation.  She denies a history of miscarriages.  She denies any recent surgeries.  She denies a family history of blood clots, heart attacks, or strokes. -It was  deemed to be an unprovoked DVT. -She was placed on Eliquis and reports she is tolerating therapy well.  She will continue Eliquis therapy due to the high risk of subsequent clots. -She has undergone a hypercoagulable evaluation and was negative for factor V Leiden, PT gene mutation, beta-2 glycoprotein, and negative LA. - Patient had bilateral lower extremity Doppler done on 01/06/2018 which showed no evidence of DVT in lower extremities.  Left lower extremity DVT has resolved. -Labs done on 02/16/2019 showed potassium 4.5, creatinine 0.63, WBC 7.4, hemoglobin 14.1, platelets 215, and LDH 185. -She will follow-up in 6 months with repeat labs.  2.  Pulmonary nodule: - Patient had a CT of the chest done on 09/04/2017 which showed 2 new nodules in the LLL largest measuring 6 mm. - Patient had a repeat CT of the chest in December 2019 which showed nodules are stable at this time. -CT of chest on 02/16/2019 showed development of a new 3 mm right upper lobe pulmonary nodule.  Follow-up with noncontrast CT in 12 months.  6 mm left apical nodule remains stable from previous scans it is characterized as benign. -We will order her CT of her chest and January 2022 to follow-up with nodule. -We will repeat her CT in December 2020.  3.  Vitamin B12 deficiency: -Labs done on 02/16/2019 showed vitamin B12 level 123. -She will start taking vitamin B12 2.5 mg daily -We will check labs at her next visit.  4.  Vitamin D deficiency: -Labs done on 02/16/2019 showed vitamin D level at 19.99 -We will order her 50,000 units weekly -We will check her labs her next visit.      Orders placed this  encounter:  Orders Placed This Encounter  Procedures  . Lactate dehydrogenase  . CBC with Differential/Platelet  . Comprehensive metabolic panel  . Vitamin B12  . VITAMIN D 25 Hydroxy (Vit-D Deficiency, Fractures)     Francene Finders, FNP-C [place] [phone]

## 2019-03-02 NOTE — Assessment & Plan Note (Addendum)
1.  Left lower extremity DVT: - Patient had a previous DVT in the right leg in 2011.  She was treated with 4 to 6 months of anticoagulation with resolution of the clot. -She was then diagnosed in 2019 with a left lower leg extremity DVT on a Doppler that was done 07/31/2017 that showed extensive left lower extremity DVT extending from the left peroneal vein through the femoral popliteal system. -She denied any recent extensive travel.  She was not on any estrogen supplementation.  She denies a history of miscarriages.  She denies any recent surgeries.  She denies a family history of blood clots, heart attacks, or strokes. -It was deemed to be an unprovoked DVT. -She was placed on Eliquis and reports she is tolerating therapy well.  She will continue Eliquis therapy due to the high risk of subsequent clots. -She has undergone a hypercoagulable evaluation and was negative for factor V Leiden, PT gene mutation, beta-2 glycoprotein, and negative LA. - Patient had bilateral lower extremity Doppler done on 01/06/2018 which showed no evidence of DVT in lower extremities.  Left lower extremity DVT has resolved. -Labs done on 02/16/2019 showed potassium 4.5, creatinine 0.63, WBC 7.4, hemoglobin 14.1, platelets 215, and LDH 185. -She will follow-up in 6 months with repeat labs.  2.  Pulmonary nodule: - Patient had a CT of the chest done on 09/04/2017 which showed 2 new nodules in the LLL largest measuring 6 mm. - Patient had a repeat CT of the chest in December 2019 which showed nodules are stable at this time. -CT of chest on 02/16/2019 showed development of a new 3 mm right upper lobe pulmonary nodule.  Follow-up with noncontrast CT in 12 months.  6 mm left apical nodule remains stable from previous scans it is characterized as benign. -We will order her CT of her chest and January 2022 to follow-up with nodule. -We will repeat her CT in December 2020.  3.  Vitamin B12 deficiency: -Labs done on 02/16/2019 showed  vitamin B12 level 123. -She will start taking vitamin B12 2.5 mg daily -We will check labs at her next visit.  4.  Vitamin D deficiency: -Labs done on 02/16/2019 showed vitamin D level at 19.99 -We will order her 50,000 units weekly -We will check her labs her next visit.

## 2019-03-02 NOTE — Patient Instructions (Signed)
[facility] at [facility] Discharge Instructions  Follow up in 6 months with labs    Thank you for choosing [facility] at [facility] to provide your oncology and hematology care.  To afford each patient quality time with our provider, please arrive at least 15 minutes before your scheduled appointment time.   If you have a lab appointment with the Cancer Center please come in thru the Main Entrance and check in at the main information desk.  You need to re-schedule your appointment should you arrive 10 or more minutes late.  We strive to give you quality time with our providers, and arriving late affects you and other patients whose appointments are after yours.  Also, if you no show three or more times for appointments you may be dismissed from the clinic at the providers discretion.     Again, thank you for choosing [facility].  Our hope is that these requests will decrease the amount of time that you wait before being seen by our physicians.       _____________________________________________________________  Should you have questions after your visit to [facility], please contact our office at [phone] between the hours of 8:00 a.m. and 4:30 p.m.  Voicemails left after 4:00 p.m. will not be returned until the following business day.  For prescription refill requests, have your pharmacy contact our office and allow 72 hours.    Due to Covid, you will need to wear a mask upon entering the hospital. If you do not have a mask, a mask will be given to you at the Main Entrance upon arrival. For doctor visits, patients may have 1 support person with them. For treatment visits, patients can not have anyone with them due to social distancing guidelines and our immunocompromised population.      

## 2019-03-22 ENCOUNTER — Ambulatory Visit (INDEPENDENT_AMBULATORY_CARE_PROVIDER_SITE_OTHER): Payer: Medicare HMO | Admitting: Licensed Clinical Social Worker

## 2019-03-22 DIAGNOSIS — E785 Hyperlipidemia, unspecified: Secondary | ICD-10-CM

## 2019-03-22 DIAGNOSIS — I10 Essential (primary) hypertension: Secondary | ICD-10-CM | POA: Diagnosis not present

## 2019-03-22 DIAGNOSIS — Z86718 Personal history of other venous thrombosis and embolism: Secondary | ICD-10-CM

## 2019-03-22 DIAGNOSIS — F411 Generalized anxiety disorder: Secondary | ICD-10-CM

## 2019-03-22 NOTE — Chronic Care Management (AMB) (Addendum)
  Care Management Note   Christina Gonzales is a 74 y.o. year old female who is a primary care patient of Sharion Balloon, FNP. The CM team was consulted for assistance with chronic disease management and care coordination.   I reached out to Christina Gonzales by phone today.   Review of patient status, including review of consultants reports, relevant laboratory and other test results, and collaboration with appropriate care team members and the patient's provider was performed as part of comprehensive patient evaluation and provision of chronic care management services.   Social determinants of health: risk of tobacco use; risk of depression    Office Visit from 07/30/2018 in Christina Gonzales  PHQ-9 Total Score  0      GAD 7 : Generalized Anxiety Score 08/09/2015  Nervous, Anxious, on Edge 3  Control/stop worrying 1  Worry too much - different things 1  Trouble relaxing 2  Restless 1  Easily annoyed or irritable 0  Afraid - awful might happen 0  Total GAD 7 Score 8  Anxiety Difficulty Somewhat difficult   Goals          Client stated:  " I want to talk with someone about grief issues and managing grief symptoms" (pt-stated)     Current Barriers:  Grief issues related to past death of her son Grief issues related to death of her brother 03/13/2018) Mental health issues in client with Chronic Diagnoses of GAD, HTN, Hyperlipidemia, Hx of DVT  Clinical Social Work Clinical Goal(s):  Over the next 30 days, client will work with LCSW to address concerns related to grief issues of client and managing grief issues experienced by client.   Interventions:   LCSW talked with client about relaxation techniques to help client manage grief symptoms faced (enjoys decorating for Christmas, enjoys watching TV shows on Christmas, listening to music) Talked with client about grief issues experienced by client Talked with client about her social support network  (family and friends) Talked with Christina Gonzales about upcoming client appointments  Patient Self Care Activities:  Self administers medications as prescribed Attends all scheduled provider appointments Performs ADL's independently  Calls provider office as needed to ask medical questions regarding client medical needs.  Plan: Patient will talk  with RN CM [name] regarding medication cost issue of client LCSW will call client in next 4 weeks to further assess psychosocial needs of client. Patient will call LCSW as needed to discuss grief management of client Client will attend medical appointments as scheduled LCSW to talk further with client about relaxation techniques to help client manage grief symptoms experienced.  Initial goal documentation      Follow Up Plan: LCSW to call client in next 4 weeks to further assess the psychosocial needs of client at that time  Christina Gonzales.Christina Gonzales MSW, LCSW Licensed Clinical Social Worker [facility] [phone]   I have reviewed and agree with the above documentation.   Christina Dun, FNP

## 2019-03-22 NOTE — Patient Instructions (Addendum)
Licensed Clinical Social Worker Visit Information  Goals we discussed today:  Goals        . Client stated:  " I want to talk with someone about grief issues and managing grief symptoms" (pt-stated)     Current Barriers:  [name] Grief issues related to past death of her son . Grief issues related to death of her brother Mar 01, 2018) . Mental health issues in client with Chronic Diagnoses of GAD, HTN, Hyperlipidemia, Hx of DVT  Clinical Social Work Clinical Goal(s):  [name] Over the next 30 days, client will work with LCSW to address concerns related to grief issues of client and managing grief issues experienced by client.   Interventions:  LCSW talked with client about relaxation techniques to help client manage grief symptoms faced (enjoys decorating for Christmas, enjoys watching TV shows on Christmas, listening to music)  Talked with client about grief issues experienced by client  Talked with client about her social support network (family and friends)  Talked with Bartolo Darter about upcoming client appointments   Patient Self Care Activities:  . Self administers medications as prescribed . Attends all scheduled provider appointments . Performs ADL's independently  . Calls provider office as needed to ask medical questions regarding client medical needs.  Plan: Patient will talk  with RN CM [name] regarding medication cost issue of client LCSW will call client in next 4 weeks to further assess psychosocial needs of client. Patient will call LCSW as needed to discuss grief management of client Client will attend medical appointments as scheduled LCSW to talk further with client about relaxation techniques to help client manage grief symptoms experienced.  Initial goal documentation       Materials Provided:  No  Follow Up Plan:  LCSW will call client in next 4 weeks to further assess the psychosocial needs of client at that time  The patient verbalized understanding of  instructions provided today and declined a print copy of patient instruction materials.   [name].Velera Lansdale MSW, LCSW Licensed Clinical Social Worker [facility] [phone]

## 2019-04-14 ENCOUNTER — Ambulatory Visit: Payer: Self-pay | Admitting: Licensed Clinical Social Worker

## 2019-04-14 DIAGNOSIS — E785 Hyperlipidemia, unspecified: Secondary | ICD-10-CM

## 2019-04-14 DIAGNOSIS — F411 Generalized anxiety disorder: Secondary | ICD-10-CM

## 2019-04-14 DIAGNOSIS — Z86718 Personal history of other venous thrombosis and embolism: Secondary | ICD-10-CM

## 2019-04-14 DIAGNOSIS — I10 Essential (primary) hypertension: Secondary | ICD-10-CM

## 2019-04-14 NOTE — Chronic Care Management (AMB) (Addendum)
  Care Management Note   Christina Gonzales is a 74 y.o. year old female who is a primary care patient of Sharion Balloon, FNP. The CM team was consulted for assistance with chronic disease management and care coordination.   I reached out to [name] by phone today.    Review of patient status, including review of consultants reports, relevant laboratory and other test results, and collaboration with appropriate care team members and the patient's provider was performed as part of comprehensive patient evaluation and provision of chronic care management services.  Social determinants of health:risk of tobacco use; risk of depression    Office Visit from 07/30/2018 in [place]  PHQ-9 Total Score  0      GAD 7 : Generalized Anxiety Score 08/09/2015  Nervous, Anxious, on Edge 3  Control/stop worrying 1  Worry too much - different things 1  Trouble relaxing 2  Restless 1  Easily annoyed or irritable 0  Afraid - awful might happen 0  Total GAD 7 Score 8  Anxiety Difficulty Somewhat difficult    Medications    (very important)  New medications from outside sources are available for reconciliation   apixaban (ELIQUIS) 5 MG TABS tablet cholecalciferol (VITAMIN D) 1000 UNITS tablet ergocalciferol (VITAMIN D2) 1.25 MG (50000 UT) capsule lisinopril (ZESTRIL) 40 MG tablet Multiple Vitamins tablet Omega-3 1000 MG CAPS OVER THE COUNTER MEDICATION  Goals          Client stated:  " I want to talk with someone about grief issues and managing grief symptoms" (pt-stated)     Current Barriers:  Grief issues related to past death of her son Grief issues related to death of her brother 03-29-18) Mental health issues in client with Chronic Diagnoses of GAD, HTN, Hyperlipidemia, Hx of DVT  Clinical Social Work Clinical Goal(s):  Over the next 30 days, client will work with LCSW to address concerns related to grief issues of client and managing  grief issues experienced by client.   Interventions:  Previously LCSW talked with client about relaxation techniques to help client manage grief symptoms faced (enjoys decorating for holidays, enjoys watching TV shows on Christmas, listening to music) Previously talked with client about grief issues experienced by client Previously talked with client about her social support network (family and friends) Talked with Katrina Stack about upcoming client appointments Talked with Bartolo Darter about the fact that she is scheduled to get COVID vaccine today at vaccination clinic  Patient Self Care Activities:  Self administers medications as prescribed Attends all scheduled provider appointments Performs ADL's independently  Calls provider office as needed to ask medical questions regarding client medical needs.  Plan: Patient will talk  with RN CM Christina Gonzales regarding medication cost issue of client LCSW will call client in next 4 weeks to further assess psychosocial needs of client. Patient will call LCSW as needed to discuss grief management of client Client will attend medical appointments as scheduled LCSW to talk further with client about relaxation techniques to help client manage grief symptoms experienced.  Initial goal documentation       Follow Up Plan: LCSW to call client in next 4 weeks to further assess the psychosocial needs of client at that time  [name].Shamiracle Gorden MSW, LCSW Licensed Clinical Social Worker [facility] [phone]   I have reviewed and agree with the above  documentation.   [name], FNP

## 2019-04-14 NOTE — Patient Instructions (Signed)
Licensed Clinical Social Worker Visit Information  Goals we discussed today:  Goals        . Client stated:  " I want to talk with someone about grief issues and managing grief symptoms" (pt-stated)     Current Barriers:  [name] Grief issues related to past death of her son . Grief issues related to death of her brother 2018-03-29) . Mental health issues in client with Chronic Diagnoses of GAD, HTN, Hyperlipidemia, Hx of DVT  Clinical Social Work Clinical Goal(s):  [name] Over the next 30 days, client will work with LCSW to address concerns related to grief issues of client and managing grief issues experienced by client.   Interventions:   Previously LCSW talked with client about relaxation techniques to help client manage grief symptoms faced(enjoys decorating for holidays, enjoys watching TV shows on Christmas, listening to music) Previously talked with client about grief issues experienced by client Previously talked with client about her social support network (family and friends) Talked with Katrina Stack about upcoming client appointments Talked with Bartolo Darter about the fact that she is scheduled to get COVID vaccine today at vaccination clinic  Patient Self Care Activities:  . Self administers medications as prescribed . Attends all scheduled provider appointments . Performs ADL's independently  . Calls provider office as needed to ask medical questions regarding client medical needs.  Plan: Patient will talk  with RN CM [name] regarding medication cost issue of client LCSW will call client in next 4 weeks to further assess psychosocial needs of client. Patient will call LCSW as needed to discuss grief management of client Client will attend medical appointments as scheduled LCSW to talk further with client about relaxation techniques to help client manage grief symptoms experienced.  Initial goal documentation       Materials Provided: No  Follow Up Plan: LCSW  will call client in next 4 weeks to further assess the psychosocial needs of client at that time  The patient verbalized understanding of instructions provided today and declined a print copy of patient instruction materials.   [name].Satsuki Zillmer MSW, LCSW Licensed Clinical Social Worker [facility] [phone]

## 2019-05-03 ENCOUNTER — Ambulatory Visit (INDEPENDENT_AMBULATORY_CARE_PROVIDER_SITE_OTHER): Payer: Medicare HMO | Admitting: Licensed Clinical Social Worker

## 2019-05-03 DIAGNOSIS — E785 Hyperlipidemia, unspecified: Secondary | ICD-10-CM

## 2019-05-03 DIAGNOSIS — Z86718 Personal history of other venous thrombosis and embolism: Secondary | ICD-10-CM

## 2019-05-03 DIAGNOSIS — F411 Generalized anxiety disorder: Secondary | ICD-10-CM

## 2019-05-03 DIAGNOSIS — I10 Essential (primary) hypertension: Secondary | ICD-10-CM | POA: Diagnosis not present

## 2019-05-03 NOTE — Patient Instructions (Addendum)
Licensed Clinical Social Worker Visit Information  Goals we discussed today:  Goals    . "I need help paying for my Eliquis" (pt-stated)     Current Barriers:  . Film/video editor.   Nurse Case Manager Clinical Goal(s):  [name] Over the next 30 days, patient will work with CM team pharmacist to apply for patient assistance.  Interventions:  . Reviewed medications with patient and discussed cost of Eliquis. . Discussed plans with patient for ongoing care management follow up and provided patient with direct contact information for care management team  Patient Self Care Activities:  . Self administers medications as prescribed . Calls pharmacy for medication refills . Attends church or other social activities . Performs ADL's independently . Performs IADL's independently . Calls provider office for new concerns or questions  Initial goal documentation      . Client stated:  " I want to talk with someone about grief issues and managing grief symptoms" (pt-stated)     Current Barriers:  [name] Grief issues related to past death of her son . Grief issues related to death of her brother 2018-03-20) . Mental health issues in client with Chronic Diagnoses of GAD, HTN, Hyperlipidemia, Hx of DVT  Clinical Social Work Clinical Goal(s):  [name] Over the next 30 days, client will work with LCSW to address concerns related to grief issues of client and managing grief issues experienced by client.   Interventions:   PreviouslyLCSW talked with client about relaxation techniques to help client manage grief symptoms faced(enjoys decorating forholidays,enjoys watching TV shows ,enjoys listening to music) Previously talked with client about grief issues experienced by client Previously talked with client about her social support network (family and friends) Talked with Florencepreviosulyabout upcoming client appointments  Patient Self Care Activities:  . Self administers medications as  prescribed . Attends all scheduled provider appointments . Performs ADL's independently  . Calls provider office as needed to ask medical questions regarding client medical needs.  Plan: Patient will talk  with RN CM [name] regarding medication cost issue of client LCSW will call client in next 4 weeks to further assess psychosocial needs of client. Patient will call LCSW as needed to discuss grief management of client Client will attend medical appointments as scheduled LCSW to talk further with client about relaxation techniques to help client manage grief symptoms experienced.  Initial goal documentation    Materials Provided: No  Follow Up Plan: LCSW will call client in next 4 weeks to further assess the psychosocial needs of client at that time  The patient verbalized understanding of instructions provided today and declined a print copy of patient instruction materials.   [name].Jamarco Zaldivar MSW, LCSW Licensed Clinical Social Worker [facility] [phone]

## 2019-05-03 NOTE — Chronic Care Management (AMB) (Addendum)
  Care Management Note   Christina Gonzales is a 74 y.o. year old female who is a primary care patient of Sharion Balloon, FNP. The CM team was consulted for assistance with chronic disease management and care coordination.   I reached out to [name] by phone today.    Review of patient status, including review of consultants reports, relevant laboratory and other test results, and collaboration with appropriate care team members and the patient's provider was performed as part of comprehensive patient evaluation and provision of chronic care management services.   Social determinants of health:risk of tobacco use; risk of depression    Office Visit from 07/30/2018 in [place]  PHQ-9 Total Score  0      GAD 7 : Generalized Anxiety Score 08/09/2015  Nervous, Anxious, on Edge 3  Control/stop worrying 1  Worry too much - different things 1  Trouble relaxing 2  Restless 1  Easily annoyed or irritable 0  Afraid - awful might happen 0  Total GAD 7 Score 8  Anxiety Difficulty Somewhat difficult   Medications    (very important)  New medications from outside sources are available for reconciliation   apixaban (ELIQUIS) 5 MG TABS tablet cholecalciferol (VITAMIN D) 1000 UNITS tablet ergocalciferol (VITAMIN D2) 1.25 MG (50000 UT) capsule lisinopril (ZESTRIL) 40 MG tablet Multiple Vitamins tablet Omega-3 1000 MG CAPS OVER THE COUNTER MEDICATION  Goals      Client stated:  " I want to talk with someone about grief issues and managing grief symptoms" (pt-stated)     Current Barriers:  Grief issues related to past death of her son Grief issues related to death of her brother 2018-03-16) Mental health issues in client with Chronic Diagnoses of GAD, HTN, Hyperlipidemia, Hx of DVT  Clinical Social Work Clinical Goal(s):  Over the next 30 days, client will work with LCSW to address concerns related to grief issues of client and managing grief  issues experienced by client.   Interventions:   Previously LCSW talked with client about relaxation techniques to help client manage grief symptoms faced (enjoys decorating for holidays, enjoys watching TV shows ,enjoys listening to music) Previously talked with client about grief issues experienced by client Previously talked with client about her social support network (family and friends) Talked with Katrina Stack about upcoming client appointments  Patient Self Care Activities:  Self administers medications as prescribed Attends all scheduled provider appointments Performs ADL's independently  Calls provider office as needed to ask medical questions regarding client medical needs.  Plan: Patient will talk  with RN CM [name] regarding medication cost issue of client LCSW will call client in next 4 weeks to further assess psychosocial needs of client. Patient will call LCSW as needed to discuss grief management of client Client will attend medical appointments as scheduled LCSW to talk further with client about relaxation techniques to help client manage grief symptoms experienced.  Initial goal documentation      Follow Up Plan: LCSW will call client in next 4 weeks to further assess the psychosocial needs of client at that time.  [name].Tranice Laduke MSW, LCSW Licensed Clinical Social Worker [facility] [phone]  I have reviewed and agree with the above  documentation.   [name], FNP

## 2019-06-02 ENCOUNTER — Ambulatory Visit (INDEPENDENT_AMBULATORY_CARE_PROVIDER_SITE_OTHER): Payer: Medicare HMO | Admitting: Licensed Clinical Social Worker

## 2019-06-02 DIAGNOSIS — E785 Hyperlipidemia, unspecified: Secondary | ICD-10-CM

## 2019-06-02 DIAGNOSIS — Z86718 Personal history of other venous thrombosis and embolism: Secondary | ICD-10-CM

## 2019-06-02 DIAGNOSIS — I10 Essential (primary) hypertension: Secondary | ICD-10-CM

## 2019-06-02 DIAGNOSIS — F411 Generalized anxiety disorder: Secondary | ICD-10-CM

## 2019-06-02 NOTE — Chronic Care Management (AMB) (Addendum)
Chronic Care Management    Clinical Social Work Follow Up Note  06/02/2019 Name: Christina Gonzales MRN: [id number] DOB: [birth year]  Christina Gonzales is a 74 y.o. year old female who is a primary care patient of Christina Balloon, FNP. The CCM team was consulted for assistance with [name] .   Review of patient status, including review of consultants reports, other relevant assessments, and collaboration with appropriate care team members and the patient's provider was performed as part of comprehensive patient evaluation and provision of chronic care management services.    SDOH (Social Determinants of Health) assessments performed: Yes;risk for depression; risk for tobacco use    Office Visit from 07/30/2018 in [place]  PHQ-9 Total Score  0      GAD 7 : Generalized Anxiety Score 08/09/2015  Nervous, Anxious, on Edge 3  Control/stop worrying 1  Worry too much - different things 1  Trouble relaxing 2  Restless 1  Easily annoyed or irritable 0  Afraid - awful might happen 0  Total GAD 7 Score 8  Anxiety Difficulty Somewhat difficult     Outpatient Encounter Medications as of 06/02/2019  Medication Sig Note   apixaban (ELIQUIS) 5 MG TABS tablet Take 1 tablet (5 mg total) by mouth 2 (two) times daily.    cholecalciferol (VITAMIN D) 1000 UNITS tablet Take 1,000 Units by mouth daily. Reported on 05/26/2015    ergocalciferol (VITAMIN D2) 1.25 MG (50000 UT) capsule Take 1 capsule (50,000 Units total) by mouth once a week.    lisinopril (ZESTRIL) 40 MG tablet Take 1 tablet (40 mg total) by mouth daily.    Multiple Vitamins tablet Take 1 tablet by mouth. Reported on 05/26/2015 04/15/2013: Received from: [facility]   Omega-3 1000 MG CAPS Take 1 g by mouth. Reported on 05/26/2015 04/15/2013: Received from: [facility] for inflammation.    No facility-administered  encounter medications on file as of 06/02/2019.    Goals          Client stated:  " I want to talk with someone about grief issues and managing grief symptoms" (pt-stated)     Current Barriers:  Grief issues related to past death of her son Grief issues related to death of her brother 03-19-18) Mental health issues in client with Chronic Diagnoses of GAD, HTN, Hyperlipidemia, Hx of DVT  Clinical Social Work Clinical Goal(s):  Over the next 30 days, client will work with LCSW to address concerns related to grief issues of client and managing grief issues experienced by client.   Interventions:   Provided counseling support for client  LCSW talked with client about relaxation techniques to help client manage grief symptoms faced (client enjoys walking for exercise, client enjoys listening to music) Talked with client about pain issues faced by client Talked with client about social support network (client enjoys talking via phone with family or friends)  Talked with client about CCM program support Encouraged client to talk with RNCM as needed for nursing support  Patient Self Care Activities:  Self administers medications as prescribed Attends all scheduled provider appointments Performs ADL's independently  Calls provider office as needed to ask medical questions regarding client medical needs.  Plan:  Patient will talk  with RN CM [name] regarding medication cost issue of client LCSW will call client in next 4 weeks to further assess psychosocial needs of client. Patient  will call LCSW as needed to discuss grief management of client Client will attend medical appointments as scheduled LCSW to talk further with client about relaxation techniques to help client manage grief symptoms experienced.(enjoys walking, enjoys listening to music) Talked with client about social support network (enjoys talking with family friends via phone or text)  Initial goal documentation        Follow Up Plan:  LCSW to call client in next 4 weeks to assess the psychosocial needs of client at that time  [name] MSW, LCSW Licensed Clinical Social Worker [facility] [phone]  I have reviewed and agree with the above  documentation.   [name], FNP

## 2019-06-02 NOTE — Patient Instructions (Addendum)
Licensed Clinical Social Worker Visit Information  Goals we discussed today:  Goals        . Client stated:  " I want to talk with someone about grief issues and managing grief symptoms" (pt-stated)     Current Barriers:  [name] Grief issues related to past death of her son . Grief issues related to death of her brother March 04, 2018) . Mental health issues in client with Chronic Diagnoses of GAD, HTN, Hyperlipidemia, Hx of DVT  Clinical Social Work Clinical Goal(s):  [name] Over the next 30 days, client will work with LCSW to address concerns related to grief issues of client and managing grief issues experienced by client.   Interventions:   Provided counseling support for client  LCSW talked with client about relaxation techniques to help client manage grief symptoms faced (client enjoys walking for exercise, client enjoys listening to music) Talked with client about pain issues faced by client Talked with client about social support network (client enjoys talking via phone with family or friends)  Talked with client about CCM program support Encouraged client to talk with RNCM as needed for nursing support  Patient Self Care Activities:  . Self administers medications as prescribed . Attends all scheduled provider appointments . Performs ADL's independently  . Calls provider office as needed to ask medical questions regarding client medical needs.  Plan: Patient will talk  with RN CM [name] regarding medication cost issue of client LCSW will call client in next 4 weeks to further assess psychosocial needs of client. Patient will call LCSW as needed to discuss grief management of client Client will attend medical appointments as scheduled LCSW to talk further with client about relaxation techniques to help client manage grief symptoms experienced.  Initial goal documentation      Materials Provided: No  Follow Up Plan: LCSW will call client in next 4 weeks to assess the  psychosocial needs of  client at that time  The patient verbalized understanding of instructions provided today and declined a print copy of patient instruction materials.   [name].Ruffin Lada MSW, LCSW Licensed Clinical Social Worker [facility] [phone]

## 2019-06-08 ENCOUNTER — Other Ambulatory Visit: Payer: Self-pay | Admitting: Family

## 2019-06-08 DIAGNOSIS — I10 Essential (primary) hypertension: Secondary | ICD-10-CM

## 2019-07-07 ENCOUNTER — Telehealth: Payer: Self-pay

## 2019-07-20 ENCOUNTER — Ambulatory Visit (INDEPENDENT_AMBULATORY_CARE_PROVIDER_SITE_OTHER): Payer: Medicare HMO | Admitting: Family

## 2019-07-20 ENCOUNTER — Encounter: Payer: Self-pay | Admitting: Family

## 2019-07-20 ENCOUNTER — Other Ambulatory Visit: Payer: Self-pay

## 2019-07-20 VITALS — BP 148/68 | HR 78 | Temp 97.2°F | Ht 62.0 in | Wt 183.0 lb

## 2019-07-20 DIAGNOSIS — E669 Obesity, unspecified: Secondary | ICD-10-CM

## 2019-07-20 DIAGNOSIS — F411 Generalized anxiety disorder: Secondary | ICD-10-CM | POA: Diagnosis not present

## 2019-07-20 DIAGNOSIS — F320 Major depressive disorder, single episode, mild: Secondary | ICD-10-CM | POA: Diagnosis not present

## 2019-07-20 DIAGNOSIS — I10 Essential (primary) hypertension: Secondary | ICD-10-CM

## 2019-07-20 DIAGNOSIS — Z86718 Personal history of other venous thrombosis and embolism: Secondary | ICD-10-CM | POA: Diagnosis not present

## 2019-07-20 DIAGNOSIS — R7309 Other abnormal glucose: Secondary | ICD-10-CM | POA: Diagnosis not present

## 2019-07-20 DIAGNOSIS — E785 Hyperlipidemia, unspecified: Secondary | ICD-10-CM | POA: Diagnosis not present

## 2019-07-20 DIAGNOSIS — R69 Illness, unspecified: Secondary | ICD-10-CM | POA: Diagnosis not present

## 2019-07-20 LAB — CMP14+EGFR
ALT: 82 IU/L — ABNORMAL HIGH (ref 0–32)
AST: 49 IU/L — ABNORMAL HIGH (ref 0–40)
Albumin/Globulin Ratio: 1.7 (ref 1.2–2.2)
Albumin: 4.1 g/dL (ref 3.7–4.7)
Alkaline Phosphatase: 33 IU/L — ABNORMAL LOW (ref 48–121)
BUN/Creatinine Ratio: 22 (ref 12–28)
BUN: 17 mg/dL (ref 8–27)
Bilirubin Total: 0.4 mg/dL (ref 0.0–1.2)
CO2: 22 mmol/L (ref 20–29)
Calcium: 9.7 mg/dL (ref 8.7–10.3)
Chloride: 104 mmol/L (ref 96–106)
Creatinine, Ser: 0.78 mg/dL (ref 0.57–1.00)
GFR calc Af Amer: 87 mL/min/{1.73_m2} (ref >59)
GFR calc non Af Amer: 75 mL/min/{1.73_m2} (ref >59)
Globulin, Total: 2.4 g/dL (ref 1.5–4.5)
Glucose: 164 mg/dL — ABNORMAL HIGH (ref 65–99)
Potassium: 4.9 mmol/L (ref 3.5–5.2)
Sodium: 139 mmol/L (ref 134–144)
Total Protein: 6.5 g/dL (ref 6.0–8.5)

## 2019-07-20 LAB — CBC WITH DIFFERENTIAL/PLATELET
Basophils Absolute: 0 10*3/uL (ref 0.0–0.2)
Basos: 1 %
EOS (ABSOLUTE): 0.2 10*3/uL (ref 0.0–0.4)
Eos: 2 %
Hematocrit: 40.1 % (ref 34.0–46.6)
Hemoglobin: 13.4 g/dL (ref 11.1–15.9)
Immature Grans (Abs): 0 10*3/uL (ref 0.0–0.1)
Immature Granulocytes: 0 %
Lymphocytes Absolute: 2.9 10*3/uL (ref 0.7–3.1)
Lymphs: 38 %
MCH: 29.5 pg (ref 26.6–33.0)
MCHC: 33.4 g/dL (ref 31.5–35.7)
MCV: 88 fL (ref 79–97)
Monocytes Absolute: 0.5 10*3/uL (ref 0.1–0.9)
Monocytes: 7 %
Neutrophils Absolute: 3.9 10*3/uL (ref 1.4–7.0)
Neutrophils: 52 %
Platelets: 192 10*3/uL (ref 150–450)
RBC: 4.54 x10E6/uL (ref 3.77–5.28)
RDW: 12.8 % (ref 11.7–15.4)
WBC: 7.6 10*3/uL (ref 3.4–10.8)

## 2019-07-20 LAB — LIPID PANEL
Chol/HDL Ratio: 4.6 ratio — ABNORMAL HIGH (ref 0.0–4.4)
Cholesterol, Total: 243 mg/dL — ABNORMAL HIGH (ref 100–199)
HDL: 53 mg/dL (ref >39)
LDL Chol Calc (NIH): 162 mg/dL — ABNORMAL HIGH (ref 0–99)
Triglycerides: 152 mg/dL — ABNORMAL HIGH (ref 0–149)
VLDL Cholesterol Cal: 28 mg/dL (ref 5–40)

## 2019-07-20 MED ORDER — APIXABAN 5 MG PO TABS: 5.0000 mg | ORAL_TABLET | Freq: Two times a day (BID) | ORAL | 3 refills | Status: DC

## 2019-07-20 MED ORDER — LISINOPRIL 40 MG PO TABS: 40.0000 mg | ORAL_TABLET | Freq: Every day | ORAL | 2 refills | Status: DC

## 2019-07-20 NOTE — Patient Instructions (Signed)
Health Maintenance After Age 74 After age 74, you are at a higher risk for certain long-term diseases and infections as well as injuries from falls. Falls are a major cause of broken bones and head injuries in people who are older than age 74. Getting regular preventive care can help to keep you healthy and well. Preventive care includes getting regular testing and making lifestyle changes as recommended by your health care provider. Talk with your health care provider about:  Which screenings and tests you should have. A screening is a test that checks for a disease when you have no symptoms.  A diet and exercise plan that is right for you. What should I know about screenings and tests to prevent falls? Screening and testing are the best ways to find a health problem early. Early diagnosis and treatment give you the best chance of managing medical conditions that are common after age 74. Certain conditions and lifestyle choices may make you more likely to have a fall. Your health care provider may recommend:  Regular vision checks. Poor vision and conditions such as cataracts can make you more likely to have a fall. If you wear glasses, make sure to get your prescription updated if your vision changes.  Medicine review. Work with your health care provider to regularly review all of the medicines you are taking, including over-the-counter medicines. Ask your health care provider about any side effects that may make you more likely to have a fall. Tell your health care provider if any medicines that you take make you feel dizzy or sleepy.  Osteoporosis screening. Osteoporosis is a condition that causes the bones to get weaker. This can make the bones weak and cause them to break more easily.  Blood pressure screening. Blood pressure changes and medicines to control blood pressure can make you feel dizzy.  Strength and balance checks. Your health care provider may recommend certain tests to check your  strength and balance while standing, walking, or changing positions.  Foot health exam. Foot pain and numbness, as well as not wearing proper footwear, can make you more likely to have a fall.  Depression screening. You may be more likely to have a fall if you have a fear of falling, feel emotionally low, or feel unable to do activities that you used to do.  Alcohol use screening. Using too much alcohol can affect your balance and may make you more likely to have a fall. What actions can I take to lower my risk of falls? General instructions  Talk with your health care provider about your risks for falling. Tell your health care provider if: ? You fall. Be sure to tell your health care provider about all falls, even ones that seem minor. ? You feel dizzy, sleepy, or off-balance.  Take over-the-counter and prescription medicines only as told by your health care provider. These include any supplements.  Eat a healthy diet and maintain a healthy weight. A healthy diet includes low-fat dairy products, low-fat (lean) meats, and fiber from whole grains, beans, and lots of fruits and vegetables. Home safety  Remove any tripping hazards, such as rugs, cords, and clutter.  Install safety equipment such as grab bars in bathrooms and safety rails on stairs.  Keep rooms and walkways well-lit. Activity   Follow a regular exercise program to stay fit. This will help you maintain your balance. Ask your health care provider what types of exercise are appropriate for you.  If you need a cane or   walker, use it as recommended by your health care provider.  Wear supportive shoes that have nonskid soles. Lifestyle  Do not drink alcohol if your health care provider tells you not to drink.  If you drink alcohol, limit how much you have: ? 0-1 drink a day for women. ? 0-2 drinks a day for men.  Be aware of how much alcohol is in your drink. In the [place] one drink equals one typical bottle of beer (12  oz), one-half glass of wine (5 oz), or one shot of hard liquor (1 oz).  Do not use any products that contain nicotine or tobacco, such as cigarettes and e-cigarettes. If you need help quitting, ask your health care provider. Summary  Having a healthy lifestyle and getting preventive care can help to protect your health and wellness after age 74.  Screening and testing are the best way to find a health problem early and help you avoid having a fall. Early diagnosis and treatment give you the best chance for managing medical conditions that are more common for people who are older than age 74.  Falls are a major cause of broken bones and head injuries in people who are older than age 74. Take precautions to prevent a fall at home.  Work with your health care provider to learn what changes you can make to improve your health and wellness and to prevent falls. This information is not intended to replace advice given to you by your health care provider. Make sure you discuss any questions you have with your health care provider. Document Revised: 05/14/2018 Document Reviewed: 12/04/2016 Elsevier Patient Education  2020 Elsevier Inc.  

## 2019-07-20 NOTE — Progress Notes (Signed)
Subjective:    Patient ID: [name], female    DOB: 04-23-1945, 74 y.o.   MRN: [id number]  Chief Complaint  Patient presents with  . Hypertension  . Hyperlipidemia   Pt presents to the office today for chronic follow up.  Hypertension This is a chronic problem. The current episode started more than 1 year ago. The problem has been waxing and waning since onset. The problem is uncontrolled. Associated symptoms include anxiety. Pertinent negatives include no malaise/fatigue, peripheral edema or shortness of breath. Risk factors for coronary artery disease include dyslipidemia, obesity and sedentary lifestyle. The current treatment provides moderate improvement. There is no history of CVA or heart failure.  Hyperlipidemia This is a chronic problem. The current episode started more than 1 year ago. The problem is uncontrolled. Recent lipid tests were reviewed and are high. Exacerbating diseases include obesity. Pertinent negatives include no shortness of breath. Current antihyperlipidemic treatment includes statins. The current treatment provides moderate improvement of lipids. Risk factors for coronary artery disease include hypertension, a sedentary lifestyle, post-menopausal, obesity, family history and dyslipidemia.  Anxiety Presents for follow-up visit. Symptoms include depressed mood, irritability and nervous/anxious behavior. Patient reports no excessive worry, panic, restlessness or shortness of breath. Symptoms occur most days. The severity of symptoms is mild.    Depression        This is a chronic problem.  The current episode started more than 1 year ago.   The onset quality is gradual.   The problem occurs intermittently.  Associated symptoms include no restlessness.  Past medical history includes anxiety.   Hx DVT PT taking Eliquis BID.     Review of Systems  Constitutional: Positive for irritability. Negative for malaise/fatigue.  Respiratory: Negative for  shortness of breath.   Psychiatric/Behavioral: Positive for depression. The patient is nervous/anxious.   All other systems reviewed and are negative.      Objective:   Physical Exam Vitals reviewed.  Constitutional:      General: She is not in acute distress.    Appearance: She is well-developed.  HENT:     Head: Normocephalic and atraumatic.     Right Ear: Tympanic membrane normal.     Left Ear: Tympanic membrane normal.  Eyes:     Pupils: Pupils are equal, round, and reactive to light.  Neck:     Thyroid: No thyromegaly.  Cardiovascular:     Rate and Rhythm: Normal rate and regular rhythm.     Heart sounds: Normal heart sounds. No murmur heard.   Pulmonary:     Effort: Pulmonary effort is normal. No respiratory distress.     Breath sounds: Normal breath sounds. No wheezing.  Abdominal:     General: Bowel sounds are normal. There is no distension.     Palpations: Abdomen is soft.     Tenderness: There is no abdominal tenderness.  Musculoskeletal:        General: No tenderness. Normal range of motion.     Cervical back: Normal range of motion and neck supple.  Skin:    General: Skin is warm and dry.  Neurological:     Mental Status: She is alert and oriented to person, place, and time.     Cranial Nerves: No cranial nerve deficit.     Deep Tendon Reflexes: Reflexes are normal and symmetric.  Psychiatric:        Behavior: Behavior normal.        Thought Content: Thought content normal.  Judgment: Judgment normal.      BP (!) 148/68   Pulse 78   Temp (!) 97.2 F (36.2 C) (Temporal)   Ht _0  (1.575 m)   Wt 183 lb (83 kg)   SpO2 94%   BMI 33.47 kg/m       Assessment & Plan:  NAKETA DADDARIO comes in today with chief complaint of Hypertension and Hyperlipidemia   Diagnosis and orders addressed:  1. Essential hypertension, benign - lisinopril (ZESTRIL) 40 MG tablet; Take 1 tablet (40 mg total) by mouth daily. (Needs to be seen before next  refill)  Dispense: 90 tablet; Refill: 2 - CMP14+EGFR - CBC with Differential/Platelet  2. Current mild episode of major depressive disorder without prior episode ([place]) - CMP14+EGFR - CBC with Differential/Platelet  3. GAD (generalized anxiety disorder) - CMP14+EGFR - CBC with Differential/Platelet  4. History of deep vein thrombosis (DVT) of lower extremity - apixaban (ELIQUIS) 5 MG TABS tablet; Take 1 tablet (5 mg total) by mouth 2 (two) times daily.  Dispense: 180 tablet; Refill: 3 - CMP14+EGFR - CBC with Differential/Platelet  5. Hyperlipidemia, unspecified hyperlipidemia type - CMP14+EGFR - CBC with Differential/Platelet - Lipid panel  6. Obesity (BMI 30-39.9) - CMP14+EGFR - CBC with Differential/Platelet   Labs pending Health Maintenance reviewed Diet and exercise encouraged  Follow up plan:  4 months   [name], FNP

## 2019-07-22 ENCOUNTER — Other Ambulatory Visit: Payer: Self-pay | Admitting: Family

## 2019-07-22 DIAGNOSIS — R748 Abnormal levels of other serum enzymes: Secondary | ICD-10-CM

## 2019-07-22 MED ORDER — ATORVASTATIN CALCIUM 20 MG PO TABS
20.0000 mg | ORAL_TABLET | Freq: Every day | ORAL | 3 refills | Status: DC
Start: 2019-07-22 — End: 2019-12-14

## 2019-07-23 ENCOUNTER — Telehealth: Payer: Self-pay | Admitting: Family

## 2019-07-23 NOTE — Telephone Encounter (Signed)
Pt returning your call regarding [place].

## 2019-07-24 LAB — SPECIMEN STATUS REPORT

## 2019-07-24 LAB — HGB A1C W/O EAG: Hgb A1c MFr Bld: 6.4 % — ABNORMAL HIGH (ref 4.8–5.6)

## 2019-07-26 ENCOUNTER — Other Ambulatory Visit: Payer: Self-pay | Admitting: Family

## 2019-07-26 DIAGNOSIS — E1169 Type 2 diabetes mellitus with other specified complication: Secondary | ICD-10-CM

## 2019-07-26 MED ORDER — BLOOD GLUCOSE METER KIT: PACK | 0 refills | Status: AC

## 2019-07-27 DIAGNOSIS — R69 Illness, unspecified: Secondary | ICD-10-CM | POA: Diagnosis not present

## 2019-07-28 ENCOUNTER — Other Ambulatory Visit: Payer: Self-pay

## 2019-07-28 ENCOUNTER — Ambulatory Visit (HOSPITAL_COMMUNITY)
Admission: RE | Admit: 2019-07-28 | Discharge: 2019-07-28 | Disposition: A | Discharge status: Home / Self Care | Payer: Medicare HMO | Source: Ambulatory Visit | Attending: Family | Admitting: Family

## 2019-07-28 ENCOUNTER — Telehealth: Payer: Self-pay | Admitting: *Deleted

## 2019-07-28 DIAGNOSIS — R69 Illness, unspecified: Secondary | ICD-10-CM | POA: Diagnosis not present

## 2019-07-28 DIAGNOSIS — R748 Abnormal levels of other serum enzymes: Secondary | ICD-10-CM | POA: Diagnosis not present

## 2019-07-28 DIAGNOSIS — K7689 Other specified diseases of liver: Secondary | ICD-10-CM | POA: Diagnosis not present

## 2019-07-28 NOTE — Telephone Encounter (Signed)
(  Key: [id number]) For Onetouch Ultra Strips   Your information has been submitted to [place] Medicare Part D. [employer] will review the request and will issue a decision, typically within 1-3 days from your submission. You can check the updated outcome later by reopening this request.  If [employer] has not responded in 1-3 days or if you have any questions about your ePA request, please contact [place] Medicare Part D at [phone]. If you think there may be a problem with your PA request, use our live chat feature at the bottom right.

## 2019-07-28 NOTE — Telephone Encounter (Signed)
Holland Falling is needing the dx code for the [place]. [phone]

## 2019-07-28 NOTE — Telephone Encounter (Signed)
Dx code E11.9. I attempted to call [employer] and was on call for 45 minutes. We will attempt to call tomorrow.

## 2019-07-29 ENCOUNTER — Ambulatory Visit: Payer: Medicare HMO | Admitting: Pharmacist

## 2019-07-29 DIAGNOSIS — R69 Illness, unspecified: Secondary | ICD-10-CM | POA: Diagnosis not present

## 2019-07-29 NOTE — Telephone Encounter (Signed)
Approved pharmacy aware. °

## 2019-07-29 NOTE — Telephone Encounter (Signed)
Fax come through need last OV notes and labs and DX code faxed. Faxed all information to [phone]

## 2019-08-04 ENCOUNTER — Ambulatory Visit (INDEPENDENT_AMBULATORY_CARE_PROVIDER_SITE_OTHER): Payer: Medicare HMO | Admitting: *Deleted

## 2019-08-04 DIAGNOSIS — Z0000 Encounter for general adult medical examination without abnormal findings: Secondary | ICD-10-CM | POA: Diagnosis not present

## 2019-08-04 NOTE — Progress Notes (Addendum)
MEDICARE ANNUAL WELLNESS VISIT  08/04/2019  Telephone Visit Disclaimer This Medicare AWV was conducted by telephone due to national recommendations for restrictions regarding the COVID-19 Pandemic (e.g. social distancing).  I verified, using two identifiers, that I am speaking with Christina Gonzales or their authorized healthcare agent. I discussed the limitations, risks, security, and privacy concerns of performing an evaluation and management service by telephone and the potential availability of an in-person appointment in the future. The patient expressed understanding and agreed to proceed.   Subjective:  Christina Gonzales is a 74 y.o. female patient of Hawks, Theador Hawthorne, FNP who had a Medicare Annual Wellness Visit today via telephone. Analisa is retired and lives with her husband Christina Gonzales. She has two daughters living and a deceased son. She reports that she is socially active and does interact with friends/family regularly. She is minimally physically active and enjoys walking.  Patient Care Team: Sharion Balloon, FNP as PCP - General (Family Medicine) Christina Gonzales, Christina Estimable, MD as Consulting Physician (Gastroenterology) Christina Gonzales, [name], LCSW as Social Worker (Licensed Clinical Social Worker) Ilean China, RN as Case Manager  Advanced Directives 08/04/2019 03/02/2019 09/15/2018 01/08/2018 09/09/2017 08/21/2017  Does Patient Have a Medical Advance Directive? Yes Yes Yes No Yes No  Type of Advance Directive Living will [place];Living will Living will;Healthcare Power of University -  Does patient want to make changes to medical advance directive? No - Patient declined No - Patient declined No - Patient declined - No - Patient declined -  Copy of [place] in Chart? - No - copy requested No - copy requested - No - copy requested -  Would patient like information on creating a medical advance directive? - - - No -  Patient declined No - Patient declined No - Patient declined    Hospital Utilization Over the Past 12 Months: # of hospitalizations or ER visits: 0 # of surgeries: 0  Review of Systems    Patient reports that her overall health is better compared to last year.  History obtained from the patient and patient chart.   Patient Reported Readings (BP, Pulse, CBG, Weight, etc) none  Pain Assessment Pain : No/denies pain     Current Medications & Allergies (verified) Allergies as of 08/04/2019       Reactions   Heparin Nausea And Vomiting   Warfarin Other (See Comments)   unknown   Penicillins Rash        Medication List        Accurate as of August 04, 2019  9:45 AM. If you have any questions, ask your nurse or doctor.          apixaban 5 MG Tabs tablet Commonly known as: Eliquis Take 1 tablet (5 mg total) by mouth 2 (two) times daily.   atorvastatin 20 MG tablet Commonly known as: LIPITOR Take 1 tablet (20 mg total) by mouth daily.   blood glucose meter kit and supplies Dispense based on patient and insurance preference. Use up to four times daily as directed. (FOR ICD-10 E10.9, E11.9).   cholecalciferol 1000 units tablet Commonly known as: VITAMIN D Take 1,000 Units by mouth daily. Reported on 05/26/2015   ergocalciferol 1.25 MG (50000 UT) capsule Commonly known as: VITAMIN D2 Take 1 capsule (50,000 Units total) by mouth once a week.   lisinopril 40 MG tablet Commonly known as: ZESTRIL Take 1 tablet (40 mg total) by  mouth daily. (Needs to be seen before next refill)   Multiple Vitamins tablet Take 1 tablet by mouth. Reported on 05/26/2015   Omega-3 1000 MG Caps Take 1 g by mouth. Reported on 05/26/2015   OVER THE COUNTER MEDICATION Tumeric for inflammation.        History (reviewed): Past Medical History:  Diagnosis Date   Anxiety    Hyperlipidemia    Seizures ([place])    Past Surgical History:  Procedure Laterality Date   TUMOR EXCISION      Behind left eye   Family History  Problem Relation Age of Onset   Cancer Mother        lung   Diabetes Brother    Social History   Socioeconomic History   Marital status: Married    Spouse name: Christina Gonzales   Number of children: 3   Years of education: 12   Highest education level: 12th grade  Occupational History   Not on file  Tobacco Use   Smoking status: Never Smoker   Smokeless tobacco: Never Used  Vaping Use   Vaping Use: Never used  Substance and Sexual Activity   Alcohol use: No   Drug use: No   Sexual activity: Not Currently  Other Topics Concern   Not on file  Social History Narrative   Retired, lives with husband Rentz. Two daughters living, one son deceased. Enjoys walking.    Social Determinants of Health   Financial Resource Strain:    Difficulty of Paying Living Expenses:   Food Insecurity:    Worried About Charity fundraiser in the Last Year:    Arboriculturist in the Last Year:   Transportation Needs:    Film/video editor (Medical):    Lack of Transportation (Non-Medical):   Physical Activity:    Days of Exercise per Week:    Minutes of Exercise per Session:   Stress:    Feeling of Stress :   Social Connections:    Frequency of Communication with Friends and Family:    Frequency of Social Gatherings with Friends and Family:    Attends Religious Services:    Active Member of Clubs or Organizations:    Attends Archivist Meetings:    Marital Status:     Activities of Daily Living In your present state of health, do you have any difficulty performing the following activities: 08/04/2019  Hearing? N  Vision? N  Difficulty concentrating or making decisions? N  Walking or climbing stairs? N  Dressing or bathing? N  Doing errands, shopping? N  Preparing Food and eating ? N  Using the Toilet? N  In the past six months, have you accidently leaked urine? N  Do you have problems with loss of bowel control? N  Managing your  Medications? N  Managing your Finances? N  Housekeeping or managing your Housekeeping? N  Some recent data might be hidden    Patient Education/ Literacy How often do you need to have someone help you when you read instructions, pamphlets, or other written materials from your doctor or pharmacy?: 1 - Never What is the last grade level you completed in school?: 12  Exercise Current Exercise Habits: Home exercise routine, Type of exercise: walking, Time (Minutes): 45, Frequency (Times/Week): 3, Weekly Exercise (Minutes/Week): 135, Intensity: Mild, Exercise limited by: None identified  Diet Patient reports consuming 3 meals a day and 0 snack(s) a day Patient reports that her primary diet is: Regular Patient reports that  she does have regular access to food.   Depression Screen PHQ 2/9 Scores 08/04/2019 09/03/2018 07/30/2018 07/30/2018 06/11/2018 04/06/2018 03/26/2018  PHQ - 2 Score 0 0 0 0 1 2 0  PHQ- 9 Score - - 0 - - 4 -     Fall Risk Fall Risk  08/04/2019 07/20/2019 09/03/2018 07/30/2018 06/11/2018  Falls in the past year? 0 0 0 0 0  Number falls in past yr: - - - - -  Injury with Fall? - - - - -  Risk for fall due to : - - - - -     Objective:  Christina Gonzales seemed alert and oriented and she participated appropriately during our telephone visit.  Blood Pressure Weight BMI  BP Readings from Last 3 Encounters:  07/20/19 (!) 148/68  03/02/19 (!) 164/63  09/15/18 (!) 150/72   Wt Readings from Last 3 Encounters:  07/20/19 183 lb (83 kg)  03/02/19 184 lb 4 oz (83.6 kg)  09/15/18 179 lb 12.8 oz (81.6 kg)   BMI Readings from Last 1 Encounters:  07/20/19 33.47 kg/m    *Unable to obtain current vital signs, weight, and BMI due to telephone visit type  Hearing/Vision  [place] did not seem to have difficulty with hearing/understanding during the telephone conversation Reports that she has not had a formal eye exam by an eye care professional within the past year Reports that she  has not had a formal hearing evaluation within the past year *Unable to fully assess hearing and vision during telephone visit type  Cognitive Function: 6CIT Screen 08/04/2019  What Year? 0 points  What month? 0 points  What time? 0 points  Count back from 20 4 points  Months in reverse 4 points  Repeat phrase 0 points  Total Score 8   (Normal:0-7, Significant for Dysfunction: >8)  Normal Cognitive Function Screening: incomplete, patient refused part   Immunization & Health Maintenance Record Immunization History  Administered Date(s) Administered   Fluad Quad(high Dose 65+) 11/19/2018   Influenza, High Dose Seasonal PF 11/10/2013   Influenza,inj,Quad PF,6+ Mos 11/14/2014   Influenza-Unspecified 04/05/2009   Moderna SARS-COVID-2 Vaccination 04/09/2019, 05/10/2019   Pneumococcal Conjugate-13 11/14/2014   Pneumococcal Polysaccharide-23 04/05/2009, 11/18/2017   Zoster 12/19/2010   Zoster Recombinat (Shingrix) 11/19/2018, 01/16/2019    Health Maintenance  Topic Date Due   DEXA SCAN  Never done   MAMMOGRAM  05/11/2018   INFLUENZA VACCINE  09/05/2019   TETANUS/TDAP  10/19/2021   COLONOSCOPY  01/06/2023   COVID-19 Vaccine  Completed   Hepatitis C Screening  Completed   PNA vac Low Risk Adult  Completed       Assessment  This is a routine wellness examination for Meiling J Damico.  Health Maintenance: Due or Overdue Health Maintenance Due  Topic Date Due   DEXA SCAN  Never done   MAMMOGRAM  05/11/2018    Christina Gonzales does not need a referral for Community Assistance: Care Management:   no Social Work:    no Prescription Assistance:  no Nutrition/Diabetes Education:  no   Plan:  Personalized Goals Goals Addressed             This Visit's Progress    Patient Stated       08/04/2019 AWV Goal: Keep All Scheduled Appointments  Over the next year, patient will attend all scheduled appointments with their PCP and any specialists that they see.          Personalized Health Maintenance &  Screening Recommendations  Bone densitometry screening, Mammogram  Lung Cancer Screening Recommended: no (Low Dose CT Chest recommended if Age 7-80 years, 30 pack-year currently smoking OR have quit w/in past 15 years) Hepatitis C Screening recommended: no HIV Screening recommended: no  Advanced Directives: Written information was not prepared per patient's request.  Referrals & Orders No orders of the defined types were placed in this encounter.   Follow-up Plan Follow-up with Sharion Balloon, FNP as planned Schedule Mammogram and Bone Density Scan    I have personally reviewed and noted the following in the patient's chart:   Medical and social history Use of alcohol, tobacco or illicit drugs  Current medications and supplements Functional ability and status Nutritional status Physical activity Advanced directives List of other physicians Hospitalizations, surgeries, and ER visits in previous 12 months Vitals Screenings to include cognitive, depression, and falls Referrals and appointments  In addition, I have reviewed and discussed with Christina Gonzales certain preventive protocols, quality metrics, and best practice recommendations. A written personalized care plan for preventive services as well as general preventive health recommendations is available and can be mailed to the patient at her request.      Baldomero Lamy, LPN [id number]  I have reviewed and agree with the above AWV documentation.   [name], FNP

## 2019-08-10 ENCOUNTER — Ambulatory Visit: Payer: Medicare HMO | Admitting: Licensed Clinical Social Worker

## 2019-08-10 DIAGNOSIS — Z86718 Personal history of other venous thrombosis and embolism: Secondary | ICD-10-CM

## 2019-08-10 DIAGNOSIS — F411 Generalized anxiety disorder: Secondary | ICD-10-CM

## 2019-08-10 DIAGNOSIS — I10 Essential (primary) hypertension: Secondary | ICD-10-CM

## 2019-08-10 DIAGNOSIS — E785 Hyperlipidemia, unspecified: Secondary | ICD-10-CM

## 2019-08-10 NOTE — Patient Instructions (Addendum)
Licensed Clinical Social Worker Visit Information  Materials Provided: No  08/10/2019  Name: Christina Gonzales  MRN: [id number]       DOB: Jul 13, 1945  Christina Gonzales is a 74 y.o. year old female who is a primary care patient of Sharion Balloon, FNP. The CCM team was consulted for assistance with [name] .   Review of patient status, including review of consultants reports, other relevant assessments, and collaboration with appropriate care team members and the patient's provider was performed as part of comprehensive patient evaluation and provision of chronic care management services.    SDOH (Social Determinants of Health) assessments performed: No; risk for tobacco use; risk for depression; risk for stress; risk for financial challenges   LCSW called client home phone number today but was not able to speak via phone with client. LCSW also called mobile phone number for client today but was not able to speak via phone with client. LCSW left phone messages for client today on her home phone and cell phone requesting return call to LCSW at [phone]  Follow Up Plan: LCSW to call client in next 4 weeks to further assess the psychosocial needs of client at that time  LCSW was not able to speak via phone with client today; thus, the patient was not able to  verbalize understanding of instructions provided today and was not able to accept or decline a print copy of patient instruction materials.   [name].Oluwaseyi Raffel MSW, LCSW Licensed Clinical Social Worker [facility] [phone]

## 2019-08-10 NOTE — Chronic Care Management (AMB) (Addendum)
  Chronic Care Management    Clinical Social Work Follow Up Note  08/10/2019 Name: Christina Gonzales MRN: [id number] DOB: [birth year]  Christina Gonzales is a 74 y.o. year old female who is a primary care patient of Sharion Balloon, FNP. The CCM team was consulted for assistance with [name] .   Review of patient status, including review of consultants reports, other relevant assessments, and collaboration with appropriate care team members and the patient's provider was performed as part of comprehensive patient evaluation and provision of chronic care management services.    SDOH (Social Determinants of Health) assessments performed: No; risk for tobacco use; risk for depression; risk for stress; risk for financial challenges    Office Visit from 07/30/2018 in [place]  PHQ-9 Total Score 0       GAD 7 : Generalized Anxiety Score 08/09/2015  Nervous, Anxious, on Edge 3  Control/stop worrying 1  Worry too much - different things 1  Trouble relaxing 2  Restless 1  Easily annoyed or irritable 0  Afraid - awful might happen 0  Total GAD 7 Score 8  Anxiety Difficulty Somewhat difficult     Outpatient Encounter Medications as of 08/10/2019  Medication Sig Note   apixaban (ELIQUIS) 5 MG TABS tablet Take 1 tablet (5 mg total) by mouth 2 (two) times daily.    atorvastatin (LIPITOR) 20 MG tablet Take 1 tablet (20 mg total) by mouth daily.    blood glucose meter kit and supplies Dispense based on patient and insurance preference. Use up to four times daily as directed. (FOR ICD-10 E10.9, E11.9).    cholecalciferol (VITAMIN D) 1000 UNITS tablet Take 1,000 Units by mouth daily. Reported on 05/26/2015    ergocalciferol (VITAMIN D2) 1.25 MG (50000 UT) capsule Take 1 capsule (50,000 Units total) by mouth once a week.    lisinopril (ZESTRIL) 40 MG tablet Take 1 tablet (40 mg total) by mouth daily. (Needs to be seen before next refill)    Multiple Vitamins  tablet Take 1 tablet by mouth. Reported on 05/26/2015 04/15/2013: Received from: [facility]   Omega-3 1000 MG CAPS Take 1 g by mouth. Reported on 05/26/2015 04/15/2013: Received from: [place] for inflammation.    No facility-administered encounter medications on file as of 08/10/2019.    LCSW called client home phone number today but was not able to speak via phone with client. LCSW also called mobile phone number for client today but was not able to speak via phone with client. LCSW left phone messages for client today on her home phone and cell phone requesting return call to LCSW at [phone]  Follow Up Plan: LCSW to call client in next 4 weeks to further assess the psychosocial needs of client at that time  [name].Climmie Cronce MSW, LCSW Licensed Clinical Social Worker [facility] Care [facility] [phone]  I have reviewed the CCM documentation and agree with the written assessment and plan of care.  [name], FNP

## 2019-08-24 ENCOUNTER — Inpatient Hospital Stay (HOSPITAL_COMMUNITY): Payer: Medicare HMO

## 2019-08-31 ENCOUNTER — Ambulatory Visit (HOSPITAL_COMMUNITY): Payer: Medicare HMO | Admitting: Nurse Practitioner

## 2019-09-10 ENCOUNTER — Ambulatory Visit: Payer: Medicare HMO | Admitting: Licensed Clinical Social Worker

## 2019-09-10 DIAGNOSIS — E785 Hyperlipidemia, unspecified: Secondary | ICD-10-CM

## 2019-09-10 DIAGNOSIS — F411 Generalized anxiety disorder: Secondary | ICD-10-CM

## 2019-09-10 DIAGNOSIS — I10 Essential (primary) hypertension: Secondary | ICD-10-CM

## 2019-09-10 DIAGNOSIS — Z86718 Personal history of other venous thrombosis and embolism: Secondary | ICD-10-CM

## 2019-09-10 NOTE — Chronic Care Management (AMB) (Addendum)
Chronic Care Management    Clinical Social Work Follow Up Note  09/10/2019 Name: Christina Gonzales MRN: [id number] DOB: January 01, 1946  Christina Gonzales is a 74 y.o. year old female who is a primary care patient of Christina Balloon, FNP. The CCM team was consulted for assistance with [name] .   Review of patient status, including review of consultants reports, other relevant assessments, and collaboration with appropriate care team members and the patient's provider was performed as part of comprehensive patient evaluation and provision of chronic care management services.    SDOH (Social Determinants of Health) assessments performed: No; risk for tobacco use; risk of depression; risk of stress; risk of financial strain    Office Visit from 07/30/2018 in [place]  PHQ-9 Total Score 0         GAD 7 : Generalized Anxiety Score 08/09/2015  Nervous, Anxious, on Edge 3  Control/stop worrying 1  Worry too much - different things 1  Trouble relaxing 2  Restless 1  Easily annoyed or irritable 0  Afraid - awful might happen 0  Total GAD 7 Score 8  Anxiety Difficulty Somewhat difficult    Outpatient Encounter Medications as of 09/10/2019  Medication Sig Note   apixaban (ELIQUIS) 5 MG TABS tablet Take 1 tablet (5 mg total) by mouth 2 (two) times daily.    atorvastatin (LIPITOR) 20 MG tablet Take 1 tablet (20 mg total) by mouth daily.    blood glucose meter kit and supplies Dispense based on patient and insurance preference. Use up to four times daily as directed. (FOR ICD-10 E10.9, E11.9).    cholecalciferol (VITAMIN D) 1000 UNITS tablet Take 1,000 Units by mouth daily. Reported on 05/26/2015    ergocalciferol (VITAMIN D2) 1.25 MG (50000 UT) capsule Take 1 capsule (50,000 Units total) by mouth once a week.    lisinopril (ZESTRIL) 40 MG tablet Take 1 tablet (40 mg total) by mouth daily. (Needs to be seen before next refill)    Multiple Vitamins tablet Take  1 tablet by mouth. Reported on 05/26/2015 04/15/2013: Received from: [facility]   Omega-3 1000 MG CAPS Take 1 g by mouth. Reported on 05/26/2015 04/15/2013: Received from: [facility] for inflammation.    No facility-administered encounter medications on file as of 09/10/2019.     Goals                  Client stated:  " I want to talk with someone about grief issues and managing grief symptoms" (pt-stated)      Current Barriers:  Grief issues related to past death of her son Grief issues related to death of her brother 02/22/2018) Mental health issues in client with Chronic Diagnoses of GAD, HTN, Hyperlipidemia, Hx of DVT  Clinical Social Work Clinical Goal(s):  Over the next 30 days, client will work with LCSW to address concerns related to grief issues of client and managing grief issues experienced by client.   Interventions:    LCSW talked with client about relaxation techniques to help client manage grief symptoms faced (client enjoys walking for exercise, client enjoys listening to music) Talked with client about pain issues faced by client Talked with client about social support network (client enjoys talking via phone with family or friends)  Talked with client about CCM program support Encouraged client to talk with RNCM as needed for nursing support Talked  with client about her upcoming medical appointments Talked with client about meal provision for client Talked with client about medication procurement of client  Patient Self Care Activities:  Self administers medications as prescribed Attends all scheduled provider appointments Performs ADL's independently  Calls provider office as needed to ask medical questions regarding client medical needs.  Plan: Patient will talk  with RN CM [name] regarding medication cost issue of client LCSW will call client in next 4 weeks to  further assess psychosocial needs of client. Patient will call LCSW as needed to discuss grief management of client Client will attend medical appointments as scheduled LCSW to talk further with client about relaxation techniques to help client manage grief symptoms experienced.  Initial goal documentation   Follow Up Plan:  LCSW to call client in next 4 weeks to further assess the psychosocial needs of client at that time  Christina Gonzales.Christina Gonzales MSW, LCSW Licensed Clinical Social Worker [facility] [phone]  I have reviewed the CCM documentation and agree with the written assessment and plan of care.  Christina Dun, FNP

## 2019-09-10 NOTE — Patient Instructions (Addendum)
Licensed Clinical Social Worker Visit Information  Goals we discussed today:     .  Client stated:  " I want to talk with someone about grief issues and managing grief symptoms" (pt-stated)       Current Barriers:   Grief issues related to past death of her son  Grief issues related to death of her brother 03-22-18)  Mental health issues in client with Chronic Diagnoses of GAD, HTN, Hyperlipidemia, Hx of DVT  Clinical Social Work Clinical Goal(s):   Over the next 30 days, client will work with LCSW to address concerns related to grief issues of client and managing grief issues experienced by client.   Interventions:    LCSW talked with client about relaxation techniques to help client manage grief symptoms faced(client enjoys walking for exercise, client enjoys listening to music) Talked with client about pain issues faced by client Talked with client about social support network (client enjoys talking via phone with family or friends) Talked with client about CCM program support Encouraged client to talk with RNCM as needed for nursing support Talked with client about her upcoming medical appointments Talked with client about meal provision for client Talked with client about medication procurement of client  Patient Self Care Activities:   Self administers medications as prescribed  Attends all scheduled provider appointments  Performs ADL's independently   Calls provider office as needed to ask medical questions regarding client medical needs.  Plan: Patient will talk  with RN CM [name] regarding medication cost issue of client LCSW will call client in next 4 weeks to further assess psychosocial needs of client. Patient will call LCSW as needed to discuss grief management of client Client will attend medical appointments as scheduled LCSW to talk further with client about relaxation techniques to help client manage grief symptoms  experienced.  Initial goal documentation   Follow Up Plan: LCSW to call client in next 4 weeks to further assess the psychosocial needs of client at that time  Materials Provided: No  The patient verbalized understanding of instructions provided today and declined a print copy of patient instruction materials.   [name].Roben Tatsch MSW, LCSW Licensed Clinical Social Worker [facility] [phone]

## 2019-09-19 ENCOUNTER — Other Ambulatory Visit (HOSPITAL_COMMUNITY): Payer: Self-pay | Admitting: Nurse Practitioner

## 2019-09-19 DIAGNOSIS — Z86718 Personal history of other venous thrombosis and embolism: Secondary | ICD-10-CM

## 2019-09-20 ENCOUNTER — Other Ambulatory Visit (HOSPITAL_COMMUNITY): Payer: Self-pay | Admitting: *Deleted

## 2019-09-20 ENCOUNTER — Other Ambulatory Visit: Payer: Self-pay | Admitting: Family

## 2019-09-20 DIAGNOSIS — Z86718 Personal history of other venous thrombosis and embolism: Secondary | ICD-10-CM

## 2019-09-20 MED ORDER — APIXABAN 5 MG PO TABS: 5.0000 mg | ORAL_TABLET | Freq: Two times a day (BID) | ORAL | 3 refills | Status: DC

## 2019-09-20 NOTE — Telephone Encounter (Signed)
LMOVM that her medication was refilled by Oncology today

## 2019-09-20 NOTE — Telephone Encounter (Signed)
°  Prescription Request  09/20/2019  What is the name of the medication or equipment? apixaban (ELIQUIS) 5 MG TABS tablet    Have you contacted your pharmacy to request a refill? (if applicable) no  Which pharmacy would you like this sent to? [facility] in [place]   Patient notified that their request is being sent to the clinical staff for review and that they should receive a response within 2 business days.

## 2019-09-22 ENCOUNTER — Other Ambulatory Visit (HOSPITAL_COMMUNITY): Payer: Self-pay | Admitting: *Deleted

## 2019-10-05 ENCOUNTER — Inpatient Hospital Stay (HOSPITAL_COMMUNITY): Payer: Medicare HMO | Attending: Nurse Practitioner | Admitting: Nurse Practitioner

## 2019-10-05 ENCOUNTER — Ambulatory Visit (HOSPITAL_COMMUNITY): Payer: Medicare HMO | Admitting: Nurse Practitioner

## 2019-10-05 ENCOUNTER — Other Ambulatory Visit: Payer: Self-pay

## 2019-10-05 ENCOUNTER — Inpatient Hospital Stay (HOSPITAL_COMMUNITY): Payer: Medicare HMO

## 2019-10-05 VITALS — BP 174/61 | HR 62 | Temp 97.7°F | Resp 16 | Wt 178.1 lb

## 2019-10-05 DIAGNOSIS — R918 Other nonspecific abnormal finding of lung field: Secondary | ICD-10-CM | POA: Insufficient documentation

## 2019-10-05 DIAGNOSIS — Z7901 Long term (current) use of anticoagulants: Secondary | ICD-10-CM | POA: Diagnosis not present

## 2019-10-05 DIAGNOSIS — R911 Solitary pulmonary nodule: Secondary | ICD-10-CM

## 2019-10-05 DIAGNOSIS — Z86718 Personal history of other venous thrombosis and embolism: Secondary | ICD-10-CM | POA: Diagnosis not present

## 2019-10-05 DIAGNOSIS — E559 Vitamin D deficiency, unspecified: Secondary | ICD-10-CM

## 2019-10-05 DIAGNOSIS — E538 Deficiency of other specified B group vitamins: Secondary | ICD-10-CM | POA: Insufficient documentation

## 2019-10-05 LAB — CBC WITH DIFFERENTIAL/PLATELET
Abs Immature Granulocytes: 0.01 10*3/uL (ref 0.00–0.07)
Basophils Absolute: 0 10*3/uL (ref 0.0–0.1)
Basophils Relative: 1 %
Eosinophils Absolute: 0.2 10*3/uL (ref 0.0–0.5)
Eosinophils Relative: 3 %
HCT: 42.1 % (ref 36.0–46.0)
Hemoglobin: 13.9 g/dL (ref 12.0–15.0)
Immature Granulocytes: 0 %
Lymphocytes Relative: 41 %
Lymphs Abs: 2.4 10*3/uL (ref 0.7–4.0)
MCH: 29.6 pg (ref 26.0–34.0)
MCHC: 33 g/dL (ref 30.0–36.0)
MCV: 89.6 fL (ref 80.0–100.0)
Monocytes Absolute: 0.5 10*3/uL (ref 0.1–1.0)
Monocytes Relative: 9 %
Neutro Abs: 2.8 10*3/uL (ref 1.7–7.7)
Neutrophils Relative %: 46 %
Platelets: 170 10*3/uL (ref 150–400)
RBC: 4.7 MIL/uL (ref 3.87–5.11)
RDW: 12.4 % (ref 11.5–15.5)
WBC: 5.9 10*3/uL (ref 4.0–10.5)
nRBC: 0 % (ref 0.0–0.2)

## 2019-10-05 LAB — COMPREHENSIVE METABOLIC PANEL
ALT: 74 U/L — ABNORMAL HIGH (ref 0–44)
AST: 48 U/L — ABNORMAL HIGH (ref 15–41)
Albumin: 4 g/dL (ref 3.5–5.0)
Alkaline Phosphatase: 25 U/L — ABNORMAL LOW (ref 38–126)
Anion gap: 9 (ref 5–15)
BUN: 15 mg/dL (ref 8–23)
CO2: 24 mmol/L (ref 22–32)
Calcium: 9.4 mg/dL (ref 8.9–10.3)
Chloride: 104 mmol/L (ref 98–111)
Creatinine, Ser: 0.61 mg/dL (ref 0.44–1.00)
GFR calc Af Amer: >60 mL/min (ref >60)
GFR calc non Af Amer: >60 mL/min (ref >60)
Glucose, Bld: 112 mg/dL — ABNORMAL HIGH (ref 70–99)
Potassium: 4.4 mmol/L (ref 3.5–5.1)
Sodium: 137 mmol/L (ref 135–145)
Total Bilirubin: 0.8 mg/dL (ref 0.3–1.2)
Total Protein: 7.4 g/dL (ref 6.5–8.1)

## 2019-10-05 LAB — VITAMIN B12: Vitamin B-12: 251 pg/mL (ref 180–914)

## 2019-10-05 LAB — VITAMIN D 25 HYDROXY (VIT D DEFICIENCY, FRACTURES): Vit D, 25-Hydroxy: 58.84 ng/mL (ref 30–100)

## 2019-10-05 LAB — LACTATE DEHYDROGENASE: LDH: 163 U/L (ref 98–192)

## 2019-10-05 NOTE — Assessment & Plan Note (Signed)
1.  Left lower extremity DVT: - Patient had a previous DVT in the right leg in 2011.  She was treated with 4 to 6 months of anticoagulation with resolution of the clot. -She was then diagnosed in 2019 with a left lower leg extremity DVT on a Doppler that was done 07/31/2017 that showed extensive left lower extremity DVT extending from the left peroneal vein through the femoral popliteal system. -She denied any recent extensive travel.  She was not on any estrogen supplementation.  She denies a history of miscarriages.  She denies any recent surgeries.  She denies a family history of blood clots, heart attacks, or strokes. -It was deemed to be an unprovoked DVT. -She was placed on Eliquis and reports she is tolerating therapy well.  She will continue Eliquis therapy due to the high risk of subsequent clots. -She has undergone a hypercoagulable evaluation and was negative for factor V Leiden, PT gene mutation, beta-2 glycoprotein, and negative LA. - Patient had bilateral lower extremity Doppler done on 01/06/2018 which showed no evidence of DVT in lower extremities.  Left lower extremity DVT has resolved. -Labs done on 10/05/2019 showed potassium 4.4, creatinine 0.61, WBC 5.9, hemoglobin 13.9, platelets 170, and LDH 163. -We will continue taking Eliquis. -She will follow-up in 6 months with repeat labs.  2.  Pulmonary nodule: - Patient had a CT of the chest done on 09/04/2017 which showed 2 new nodules in the LLL largest measuring 6 mm. - Patient had a repeat CT of the chest in December 2019 which showed nodules are stable at this time. -CT of chest on 02/16/2019 showed development of a new 3 mm right upper lobe pulmonary nodule.  Follow-up with noncontrast CT in 12 months.  6 mm left apical nodule remains stable from previous scans it is characterized as benign. -We will order her CT of her chest and January 2022 to follow-up with nodule.  3.  Vitamin B12 deficiency: -Labs done on 02/16/2019 showed  vitamin B12 level 123. -She will start taking vitamin B12 1 mg daily -Labs done on 10/05/2019 showed vitamin B12 level 251. -We will increase her vitamin B12 to 2.5 mg daily -We will check labs at her next visit.  4.  Vitamin D deficiency: -Labs done on 02/16/2019 showed vitamin D level at 19.99 -She will continue 50,000 units weekly -Labs done on 10/05/2019 showed vitamin D level pending -We will check her labs her next visit.

## 2019-10-05 NOTE — Progress Notes (Signed)
[facility] [place], [place] [address]   CLINIC:  Medical Oncology/Hematology  PCP:  Sharion Balloon, [address] [place] [address] [phone]   REASON FOR VISIT: Follow-up for recurrent DVTs   CURRENT THERAPY: Eliquis   INTERVAL HISTORY:  Christina Gonzales 74 y.o. female returns for routine follow-up for recurrent DVTs.  Patient reports she is doing well taking her Eliquis as prescribed.  She has no unwanted side effects. Denies any nausea, vomiting, or diarrhea. Denies any new pains. Had not noticed any recent bleeding such as epistaxis, hematuria or hematochezia. Denies recent chest pain on exertion, shortness of breath on minimal exertion, pre-syncopal episodes, or palpitations. Denies any numbness or tingling in hands or feet. Denies any recent fevers, infections, or recent hospitalizations. Patient reports appetite at 100% and energy level at 100%.  She is eating well maintain her     REVIEW OF SYSTEMS:  Review of Systems  Psychiatric/Behavioral: Positive for sleep disturbance.  All other systems reviewed and are negative.    PAST MEDICAL/SURGICAL HISTORY:  Past Medical History:  Diagnosis Date  . Anxiety   . Hyperlipidemia   . Seizures ([place])    Past Surgical History:  Procedure Laterality Date  . TUMOR EXCISION     Behind left eye     SOCIAL HISTORY:  Social History   Socioeconomic History  . Marital status: Married    Spouse name: Christina Gonzales  . Number of children: 3  . Years of education: 21  . Highest education level: 12th grade  Occupational History  . Not on file  Tobacco Use  . Smoking status: Never Smoker  . Smokeless tobacco: Never Used  Vaping Use  . Vaping Use: Never used  Substance and Sexual Activity  . Alcohol use: No  . Drug use: No  . Sexual activity: Not Currently  Other Topics Concern  . Not on file  Social History Narrative   Retired, lives with husband Christina Gonzales. Two daughters living,  one son deceased. Enjoys walking.    Social Determinants of Health   Financial Resource Strain:   . Difficulty of Paying Living Expenses: Not on file  Food Insecurity:   . Worried About Charity fundraiser in the Last Year: Not on file  . Ran Out of Food in the Last Year: Not on file  Transportation Needs:   . Lack of Transportation (Medical): Not on file  . Lack of Transportation (Non-Medical): Not on file  Physical Activity:   . Days of Exercise per Week: Not on file  . Minutes of Exercise per Session: Not on file  Stress:   . Feeling of Stress : Not on file  Social Connections:   . Frequency of Communication with Friends and Family: Not on file  . Frequency of Social Gatherings with Friends and Family: Not on file  . Attends Religious Services: Not on file  . Active Member of Clubs or Organizations: Not on file  . Attends Archivist Meetings: Not on file  . Marital Status: Not on file  Intimate Partner Violence:   . Fear of Current or Ex-Partner: Not on file  . Emotionally Abused: Not on file  . Physically Abused: Not on file  . Sexually Abused: Not on file    FAMILY HISTORY:  Family History  Problem Relation Age of Onset  . Cancer Mother        lung  . Diabetes Brother     CURRENT  MEDICATIONS:  Outpatient Encounter Medications as of 10/05/2019  Medication Sig Note  . apixaban (ELIQUIS) 5 MG TABS tablet Take 1 tablet (5 mg total) by mouth 2 (two) times daily.   [name] atorvastatin (LIPITOR) 20 MG tablet Take 1 tablet (20 mg total) by mouth daily.   . blood glucose meter kit and supplies Dispense based on patient and insurance preference. Use up to four times daily as directed. (FOR ICD-10 E10.9, E11.9).   . cholecalciferol (VITAMIN D) 1000 UNITS tablet Take 1,000 Units by mouth daily. Reported on 05/26/2015   . ELIQUIS 5 MG TABS tablet Take 1 tablet by mouth twice daily   . ergocalciferol (VITAMIN D2) 1.25 MG (50000 UT) capsule Take 1 capsule (50,000 Units total)  by mouth once a week.   . Lancets (ONETOUCH DELICA PLUS [id number]) MISC Apply topically.   [name] lisinopril (ZESTRIL) 40 MG tablet Take 1 tablet (40 mg total) by mouth daily. (Needs to be seen before next refill)   . Multiple Vitamins tablet Take 1 tablet by mouth. Reported on 05/26/2015 04/15/2013: Received from: [facility]  . Omega-3 1000 MG CAPS Take 1 g by mouth. Reported on 05/26/2015 04/15/2013: Received from: [facility]  . ONETOUCH ULTRA test strip 4 (four) times daily.   [name] OVER THE COUNTER MEDICATION Tumeric for inflammation.    No facility-administered encounter medications on file as of 10/05/2019.    ALLERGIES:  Allergies  Allergen Reactions  . Heparin Nausea And Vomiting  . Warfarin Other (See Comments)    unknown  . Penicillins Rash     PHYSICAL EXAM:  ECOG Performance status: 1  Vitals:   10/05/19 0955  BP: (!) 174/61  Pulse: 62  Resp: 16  Temp: 97.7 F (36.5 C)  SpO2: 99%   Filed Weights   10/05/19 0955  Weight: 178 lb 2.1 oz (80.8 kg)   Physical Exam Constitutional:      Appearance: Normal appearance. She is normal weight.  Cardiovascular:     Rate and Rhythm: Normal rate and regular rhythm.     Heart sounds: Normal heart sounds.  Pulmonary:     Effort: Pulmonary effort is normal.     Breath sounds: Normal breath sounds.  Abdominal:     General: Bowel sounds are normal.     Palpations: Abdomen is soft.  Musculoskeletal:        General: Normal range of motion.  Skin:    General: Skin is warm.  Neurological:     Mental Status: She is alert and oriented to person, place, and time. Mental status is at baseline.  Psychiatric:        Mood and Affect: Mood normal.        Behavior: Behavior normal.        Thought Content: Thought content normal.        Judgment: Judgment normal.      LABORATORY DATA:  I have reviewed the labs as listed.  CBC    Component Value Date/Time   WBC 5.9 10/05/2019 0848   RBC  4.70 10/05/2019 0848   HGB 13.9 10/05/2019 0848   HGB 13.4 07/20/2019 1006   HCT 42.1 10/05/2019 0848   HCT 40.1 07/20/2019 1006   PLT 170 10/05/2019 0848   PLT 192 07/20/2019 1006   MCV 89.6 10/05/2019 0848   MCV 88 07/20/2019 1006   MCH 29.6 10/05/2019 0848   MCHC 33.0 10/05/2019 0848   RDW 12.4 10/05/2019 0848  RDW 12.8 07/20/2019 1006   LYMPHSABS 2.4 10/05/2019 0848   LYMPHSABS 2.9 07/20/2019 1006   MONOABS 0.5 10/05/2019 0848   EOSABS 0.2 10/05/2019 0848   EOSABS 0.2 07/20/2019 1006   BASOSABS 0.0 10/05/2019 0848   BASOSABS 0.0 07/20/2019 1006   CMP Latest Ref Rng & Units 10/05/2019 07/20/2019 02/16/2019  Glucose 70 - 99 mg/dL 112(H) 164(H) 100(H)  BUN 8 - 23 mg/dL _0 Creatinine 0.44 - 1.00 mg/dL 0.61 0.78 0.63  Sodium 135 - 145 mmol/L 137 139 138  Potassium 3.5 - 5.1 mmol/L 4.4 4.9 4.5  Chloride 98 - 111 mmol/L 104 104 103  CO2 22 - 32 mmol/L _1 Calcium 8.9 - 10.3 mg/dL 9.4 9.7 9.7  Total Protein 6.5 - 8.1 g/dL 7.4 6.5 7.6  Total Bilirubin 0.3 - 1.2 mg/dL 0.8 0.4 0.5  Alkaline Phos 38 - 126 U/L 25(L) 33(L) 27(L)  AST 15 - 41 U/L 48(H) 49(H) 61(H)  ALT 0 - 44 U/L 74(H) 82(H) 72(H)    All questions were answered to patient's stated satisfaction. Encouraged patient to call with any new concerns or questions before his next visit to the cancer center and we can certain see him sooner, if needed.     ASSESSMENT & PLAN:  History of deep vein thrombosis (DVT) of lower extremity 1.  Left lower extremity DVT: - Patient had a previous DVT in the right leg in 2011.  She was treated with 4 to 6 months of anticoagulation with resolution of the clot. -She was then diagnosed in 2019 with a left lower leg extremity DVT on a Doppler that was done 07/31/2017 that showed extensive left lower extremity DVT extending from the left peroneal vein through the femoral popliteal system. -She denied any recent extensive travel.  She was not on any estrogen supplementation.  She  denies a history of miscarriages.  She denies any recent surgeries.  She denies a family history of blood clots, heart attacks, or strokes. -It was deemed to be an unprovoked DVT. -She was placed on Eliquis and reports she is tolerating therapy well.  She will continue Eliquis therapy due to the high risk of subsequent clots. -She has undergone a hypercoagulable evaluation and was negative for factor V Leiden, PT gene mutation, beta-2 glycoprotein, and negative LA. - Patient had bilateral lower extremity Doppler done on 01/06/2018 which showed no evidence of DVT in lower extremities.  Left lower extremity DVT has resolved. -Labs done on 10/05/2019 showed potassium 4.4, creatinine 0.61, WBC 5.9, hemoglobin 13.9, platelets 170, and LDH 163. -We will continue taking Eliquis. -She will follow-up in 6 months with repeat labs.  2.  Pulmonary nodule: - Patient had a CT of the chest done on 09/04/2017 which showed 2 new nodules in the LLL largest measuring 6 mm. - Patient had a repeat CT of the chest in December 2019 which showed nodules are stable at this time. -CT of chest on 02/16/2019 showed development of a new 3 mm right upper lobe pulmonary nodule.  Follow-up with noncontrast CT in 12 months.  6 mm left apical nodule remains stable from previous scans it is characterized as benign. -We will order her CT of her chest and January 2022 to follow-up with nodule.  3.  Vitamin B12 deficiency: -Labs done on 02/16/2019 showed vitamin B12 level 123. -She will start taking vitamin B12 1 mg daily -Labs done on 10/05/2019 showed vitamin B12 level 251. -We will increase her vitamin  B12 to 2.5 mg daily -We will check labs at her next visit.  4.  Vitamin D deficiency: -Labs done on 02/16/2019 showed vitamin D level at 19.99 -She will continue 50,000 units weekly -Labs done on 10/05/2019 showed vitamin D level pending -We will check her labs her next visit.     Orders placed this encounter:  Orders Placed This  Encounter  Procedures  . CT CHEST W CONTRAST  . Lactate dehydrogenase  . CBC with Differential/Platelet  . Comprehensive metabolic panel  . Vitamin B12  . VITAMIN D 25 Hydroxy (Vit-D Deficiency, Fractures)      Francene Finders, FNP-C [facility] [phone]

## 2019-10-06 ENCOUNTER — Encounter: Payer: Self-pay | Admitting: *Deleted

## 2019-10-18 ENCOUNTER — Ambulatory Visit (INDEPENDENT_AMBULATORY_CARE_PROVIDER_SITE_OTHER): Payer: Medicare HMO | Admitting: Licensed Clinical Social Worker

## 2019-10-18 DIAGNOSIS — I10 Essential (primary) hypertension: Secondary | ICD-10-CM

## 2019-10-18 DIAGNOSIS — F411 Generalized anxiety disorder: Secondary | ICD-10-CM

## 2019-10-18 DIAGNOSIS — E785 Hyperlipidemia, unspecified: Secondary | ICD-10-CM

## 2019-10-18 DIAGNOSIS — Z86718 Personal history of other venous thrombosis and embolism: Secondary | ICD-10-CM

## 2019-10-18 NOTE — Chronic Care Management (AMB) (Addendum)
Chronic Care Management    Clinical Social Work Follow Up Note  10/18/2019 Name: Christina Gonzales MRN: [id number] DOB: [birth year]  Christina Gonzales is a 74 y.o. year old female who is a primary care patient of Sharion Balloon, FNP. The CCM team was consulted for assistance with [name] .   Review of patient status, including review of consultants reports, other relevant assessments, and collaboration with appropriate care team members and the patient's provider was performed as part of comprehensive patient evaluation and provision of chronic care management services.    SDOH (Social Determinants of Health) assessments performed: No;risk for tobacco use; risk for depression; risk for stress; risk for financial strain; risk for physical inactivity    Office Visit from 07/30/2018 in [place]  PHQ-9 Total Score 0      GAD 7 : Generalized Anxiety Score 10/18/2019 08/09/2015  Nervous, Anxious, on Edge 0 3  Control/stop worrying 0 1  Worry too much - different things 0 1  Trouble relaxing 1 2  Restless 1 1  Easily annoyed or irritable 0 0  Afraid - awful might happen 0 0  Total GAD 7 Score 2 8  Anxiety Difficulty Somewhat difficult Somewhat difficult    Outpatient Encounter Medications as of 10/18/2019  Medication Sig Note   apixaban (ELIQUIS) 5 MG TABS tablet Take 1 tablet (5 mg total) by mouth 2 (two) times daily.    atorvastatin (LIPITOR) 20 MG tablet Take 1 tablet (20 mg total) by mouth daily.    blood glucose meter kit and supplies Dispense based on patient and insurance preference. Use up to four times daily as directed. (FOR ICD-10 E10.9, E11.9).    cholecalciferol (VITAMIN D) 1000 UNITS tablet Take 1,000 Units by mouth daily. Reported on 05/26/2015    ELIQUIS 5 MG TABS tablet Take 1 tablet by mouth twice daily    ergocalciferol (VITAMIN D2) 1.25 MG (50000 UT) capsule Take 1 capsule (50,000 Units total) by mouth once a week.    Lancets  (ONETOUCH DELICA PLUS [id number]) MISC Apply topically.    lisinopril (ZESTRIL) 40 MG tablet Take 1 tablet (40 mg total) by mouth daily. (Needs to be seen before next refill)    Multiple Vitamins tablet Take 1 tablet by mouth. Reported on 05/26/2015 04/15/2013: Received from: [facility]   Omega-3 1000 MG CAPS Take 1 g by mouth. Reported on 05/26/2015 04/15/2013: Received from: [place] test strip 4 (four) times daily.    OVER THE COUNTER MEDICATION Tumeric for inflammation.    No facility-administered encounter medications on file as of 10/18/2019.    Goals       Client stated:  " I want to talk with someone about grief issues and managing grief symptoms" (pt-stated)      Current Barriers:  Grief issues related to past death of her son Grief issues related to death of her brother 03-19-2018) Mental health issues in client with Chronic Diagnoses of GAD, HTN, Hyperlipidemia, Hx of DVT  Clinical Social Work Clinical Goal(s):  Over the next 30 days, client will work with LCSW to address concerns related to grief issues of client and managing grief issues experienced by client.   Interventions:  Talked previously  with client about grief issues experienced by client Talked with client about upcoming client appointment with [name], FNP Talked with client about pain issues faced (client said she is not having  any pain issues) Talked with client about relaxation techniques of choice Talked with Bartolo Darter about transport needs (she said she is still driving as needed) Talked with [place] about social support  Encouraged client to call RNCM as needed for nursing support Talked with Bartolo Darter about mobility issues of client Completed PHQ-9 and GAD-7 with client Talked with client about stress issues related to medical needs  Patient Self Care Activities:  Self administers medications as prescribed Attends all scheduled  provider appointments Performs ADL's independently  Calls provider office as needed to ask medical questions regarding client medical needs.  Plan: Patient will talk  with RN CM [name] regarding medication cost issue of client LCSW will call client in next 4 weeks to further assess psychosocial needs of client. Patient will call LCSW as needed to discuss grief management of client Client will attend medical appointments as scheduled LCSW to talk further with client about relaxation techniques to help client manage grief symptoms experienced.  Initial goal documentation   Follow Up Plan: LCSW to call client in next 4 weeks to further assess the psychosocial needs of client at that time  [name].Timarie Labell MSW, LCSW Licensed Clinical Social Worker [facility] Care [facility] [phone]  I have reviewed the CCM documentation and agree with the written assessment and plan of care.  [name], FNP

## 2019-10-18 NOTE — Patient Instructions (Addendum)
Licensed Clinical Social Worker Visit Information  Goals we discussed today:   .  Client stated:  " I want to talk with someone about grief issues and managing grief symptoms" (pt-stated)        Current Barriers:   Grief issues related to past death of her son  Grief issues related to death of her brother 2018/03/02)  Mental health issues in client with Chronic Diagnoses of GAD, HTN, Hyperlipidemia, Hx of DVT  Clinical Social Work Clinical Goal(s):   Over the next 30 days, client will work with LCSW to address concerns related to grief issues of client and managing grief issues experienced by client.   Interventions:  Talked previously  with client about grief issues experienced by client Talked with client about upcoming client appointment with [name], FNP Talked with client about pain issues faced (client said she is not having any pain issues) Talked with client about relaxation techniques of choice Talked with Bartolo Darter about transport needs (she said she is still driving as needed) Talked with [place] about social support  Encouraged client to call RNCM as needed for nursing support Talked with Bartolo Darter about mobility issues of client Completed PHQ-9 and GAD-7 with client Talked with client about stress issues related to medical needs  Patient Self Care Activities:   Self administers medications as prescribed  Attends all scheduled provider appointments  Performs ADL's independently   Calls provider office as needed to ask medical questions regarding client medical needs.  Plan: Patient will talk  with RN CM [name] regarding medication cost issue of client LCSW will call client in next 4 weeks to further assess psychosocial needs of client. Patient will call LCSW as needed to discuss grief management of client Client will attend medical appointments as scheduled LCSW to talk further with client about relaxation techniques to help client  manage grief symptoms experienced.  Initial goal documentation   Follow Up Plan: LCSW to call client in next 4 weeks to further assess the psychosocial needs of client at that time  Materials Provided: No  The patient verbalized understanding of instructions provided today and declined a print copy of patient instruction materials.   [name].Yaacov Koziol MSW, LCSW Licensed Clinical Social Worker [facility] [phone]

## 2019-11-02 ENCOUNTER — Encounter: Payer: Self-pay | Admitting: *Deleted

## 2019-11-18 ENCOUNTER — Ambulatory Visit: Payer: Medicare HMO | Admitting: Licensed Clinical Social Worker

## 2019-11-18 DIAGNOSIS — Z86718 Personal history of other venous thrombosis and embolism: Secondary | ICD-10-CM

## 2019-11-18 DIAGNOSIS — E785 Hyperlipidemia, unspecified: Secondary | ICD-10-CM

## 2019-11-18 DIAGNOSIS — I10 Essential (primary) hypertension: Secondary | ICD-10-CM

## 2019-11-18 DIAGNOSIS — F411 Generalized anxiety disorder: Secondary | ICD-10-CM

## 2019-11-18 NOTE — Patient Instructions (Addendum)
Licensed Clinical Social Worker Visit Information  Materials Provided: No  11/18/2019  Name: Christina Gonzales  MRN: [id number]       DOB: [birth year]  Christina Gonzales is a 74 y.o. year old female who is a primary care patient of Sharion Balloon, FNP. The CCM team was consulted for assistance with [name] .   Review of patient status, including review of consultants reports, other relevant assessments, and collaboration with appropriate care team members and the patient's provider was performed as part of comprehensive patient evaluation and provision of chronic care management services.    SDOH (Social Determinants of Health) assessments performed: No;risk for depression; risk for tobacco use; risk for stress; risk for financial strain; risk for physical inactivity  LCSW called client home phone number and called client cell number several times today but LCSW was not able to speak via phone with client; however, LCSW did leave phone message for [facility] requesting that she please return call to LCSW at [phone]  Follow Up Plan: LCSW to call client in next 4 weeks to further assess the psychosocial needs of client at that time  LCSW was not able to speak via phone with client today; thus the client was not able to verbalize understanding of instructions provided today and was not able to accept or decline a print copy of patient instruction materials.   [name].Shin Lamour MSW, LCSW Licensed Clinical Social Worker [facility] Care [facility] [phone]

## 2019-11-18 NOTE — Chronic Care Management (AMB) (Addendum)
Chronic Care Management    Clinical Social Work Follow Up Note  11/18/2019 Name: Christina Gonzales MRN: [id number] DOB: 06/11/1945  Christina Gonzales is a 74 y.o. year old female who is a primary care patient of Sharion Balloon, FNP. The CCM team was consulted for assistance with [name] .   Review of patient status, including review of consultants reports, other relevant assessments, and collaboration with appropriate care team members and the patient's provider was performed as part of comprehensive patient evaluation and provision of chronic care management services.    SDOH (Social Determinants of Health) assessments performed: No;risk for depression; risk for tobacco use; risk for stress; risk for financial strain; risk for physical inactivity    Chronic Care Management from 10/18/2019 in [place]  PHQ-9 Total Score 3      GAD 7 : Generalized Anxiety Score 10/18/2019 08/09/2015  Nervous, Anxious, on Edge 0 3  Control/stop worrying 0 1  Worry too much - different things 0 1  Trouble relaxing 1 2  Restless 1 1  Easily annoyed or irritable 0 0  Afraid - awful might happen 0 0  Total GAD 7 Score 2 8  Anxiety Difficulty Somewhat difficult Somewhat difficult     Outpatient Encounter Medications as of 11/18/2019  Medication Sig Note   apixaban (ELIQUIS) 5 MG TABS tablet Take 1 tablet (5 mg total) by mouth 2 (two) times daily.    atorvastatin (LIPITOR) 20 MG tablet Take 1 tablet (20 mg total) by mouth daily.    blood glucose meter kit and supplies Dispense based on patient and insurance preference. Use up to four times daily as directed. (FOR ICD-10 E10.9, E11.9).    cholecalciferol (VITAMIN D) 1000 UNITS tablet Take 1,000 Units by mouth daily. Reported on 05/26/2015    ELIQUIS 5 MG TABS tablet Take 1 tablet by mouth twice daily    ergocalciferol (VITAMIN D2) 1.25 MG (50000 UT) capsule Take 1 capsule (50,000 Units total) by mouth once a week.     Lancets (ONETOUCH DELICA PLUS [id number]) MISC Apply topically.    lisinopril (ZESTRIL) 40 MG tablet Take 1 tablet (40 mg total) by mouth daily. (Needs to be seen before next refill)    Multiple Vitamins tablet Take 1 tablet by mouth. Reported on 05/26/2015 04/15/2013: Received from: [facility]   Omega-3 1000 MG CAPS Take 1 g by mouth. Reported on 05/26/2015 04/15/2013: Received from: [place] test strip 4 (four) times daily.    OVER THE COUNTER MEDICATION Tumeric for inflammation.    No facility-administered encounter medications on file as of 11/18/2019.    LCSW called client home phone number and called client cell number several times today but LCSW was not able to speak via phone with client; however, LCSW did leave phone message for [facility] requesting that she please return call to LCSW at [phone]  Follow Up Plan: LCSW to call client in next 4 weeks to further assess the psychosocial needs of client at that time  [name].Arienne Gartin MSW, LCSW Licensed Clinical Social Worker [facility] [phone]  I have reviewed the CCM documentation and agree with the written assessment and plan of care.  [name], FNP

## 2019-11-19 ENCOUNTER — Ambulatory Visit: Payer: Medicare HMO | Admitting: Family

## 2019-11-23 ENCOUNTER — Ambulatory Visit: Payer: Medicare HMO | Admitting: Family

## 2019-12-07 ENCOUNTER — Ambulatory Visit: Payer: Medicare HMO | Admitting: Family

## 2019-12-12 IMAGING — US VENOUS DOPPLER ULTRASOUND OF BILATERAL LOWER EXTREMITIES
1 series · 13 of 24 positions shown · non-contrast
Comparison: 07/31/2017

CLINICAL DATA: 72-year-old with history of left lower extremity
DVT.



[Series 1: venous doppler ultrasound of bilateral lower extre · 0.08mm/px · 13 of 65 slices shown]
[im 1/65]
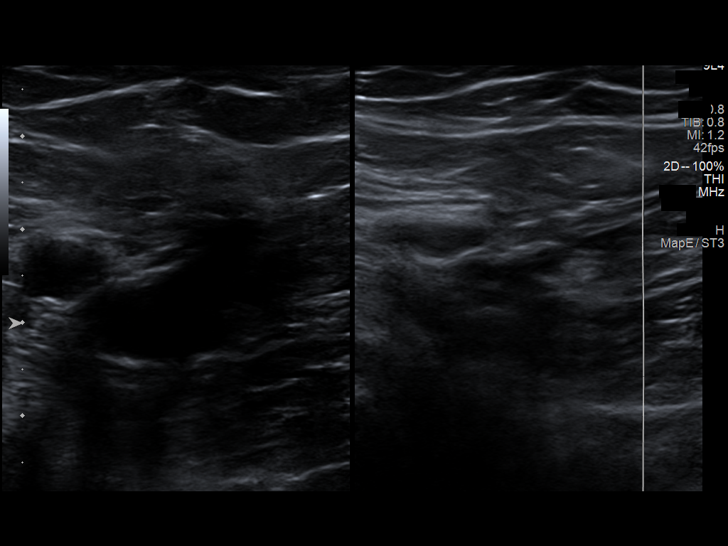
[im 6/65]
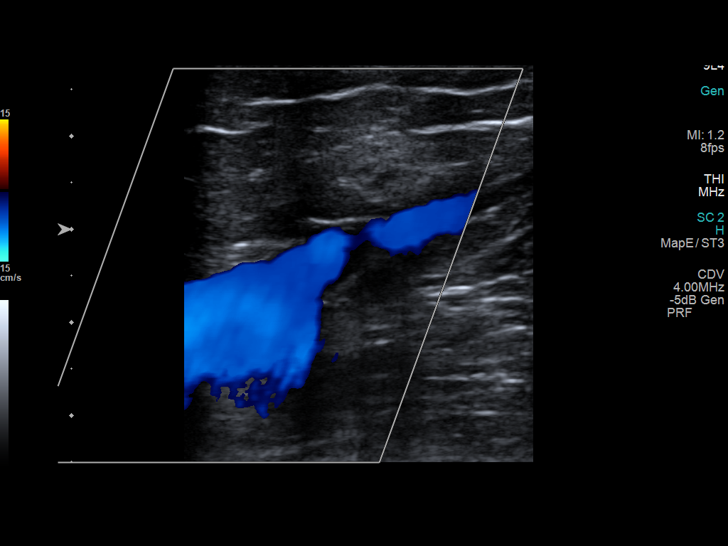
[im 12/65]
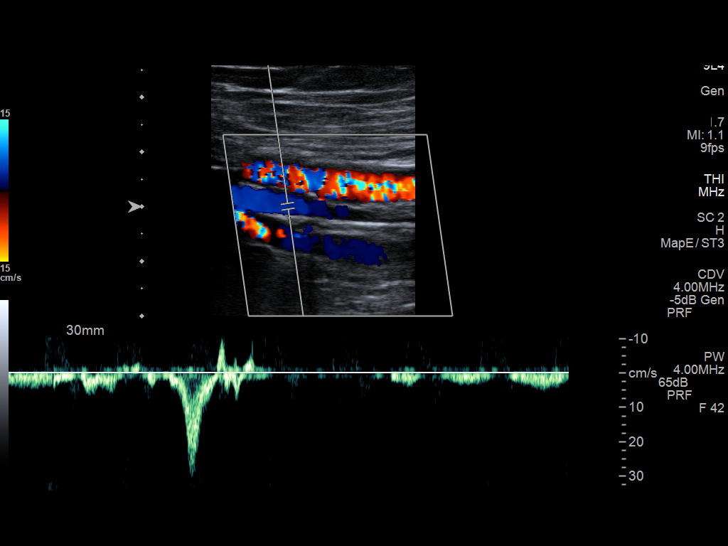
[im 17/65]
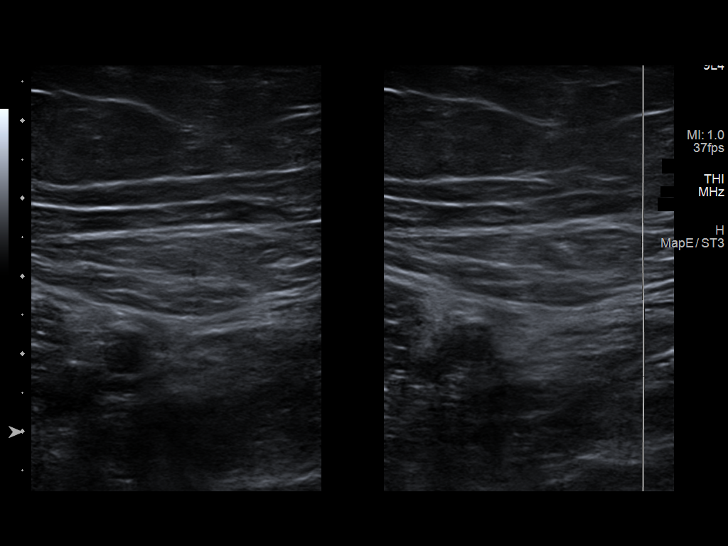
[im 23/65]
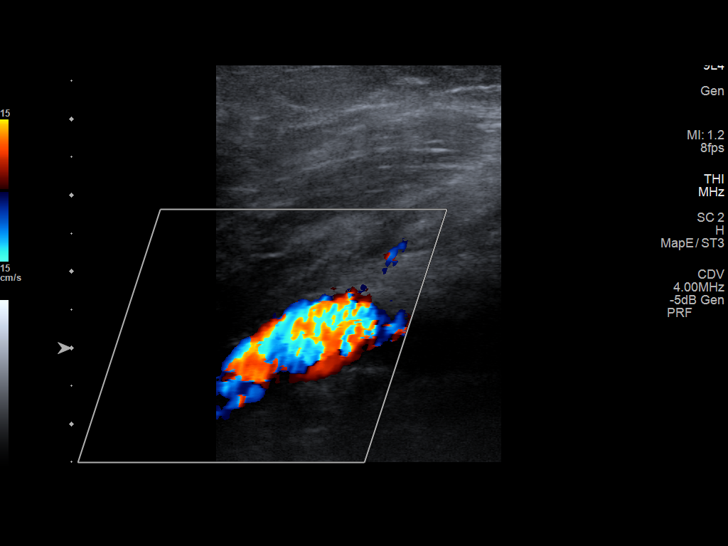
[im 28/65]
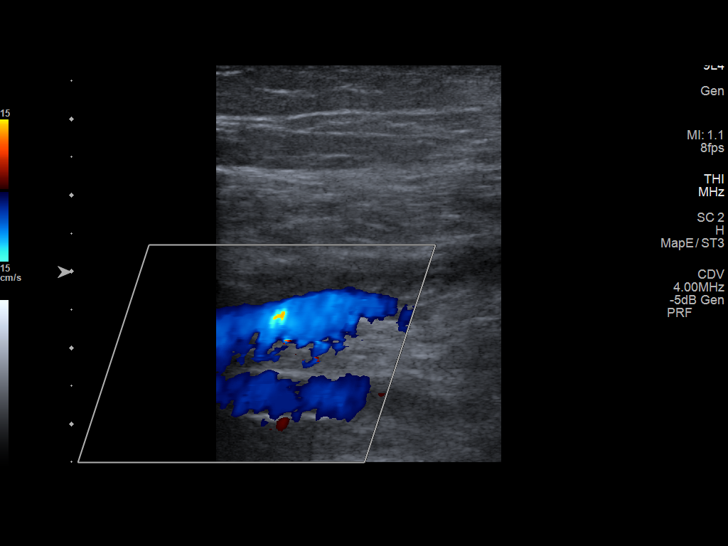
[im 34/65]
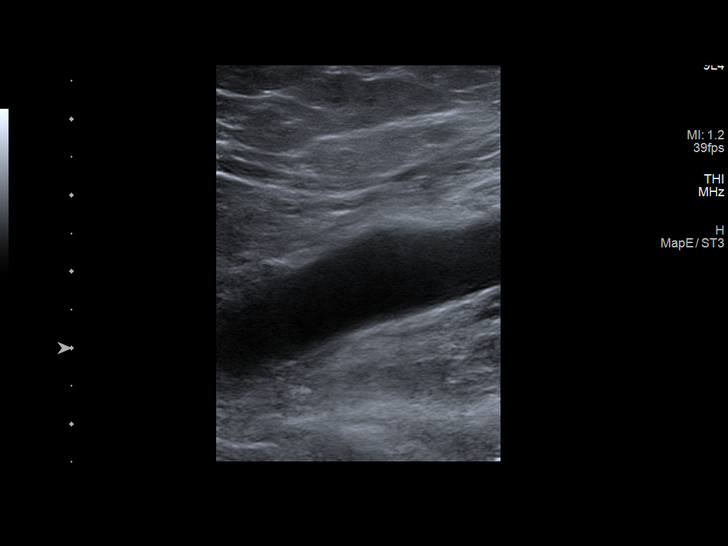
[im 37/65]
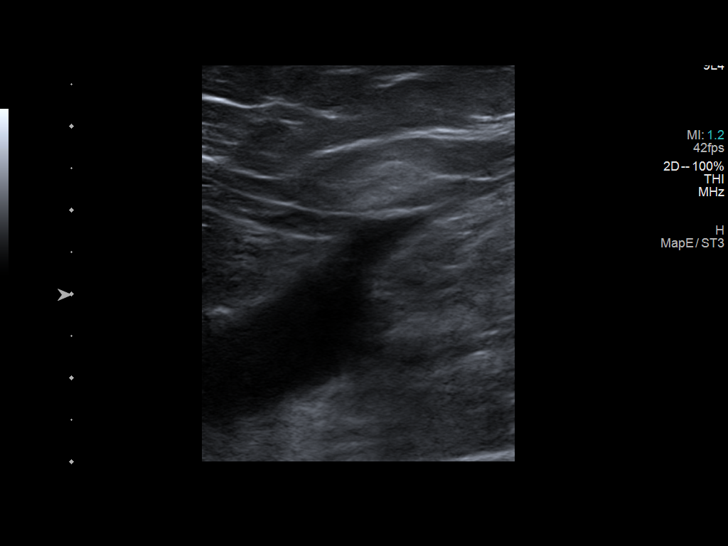
[im 42/65]
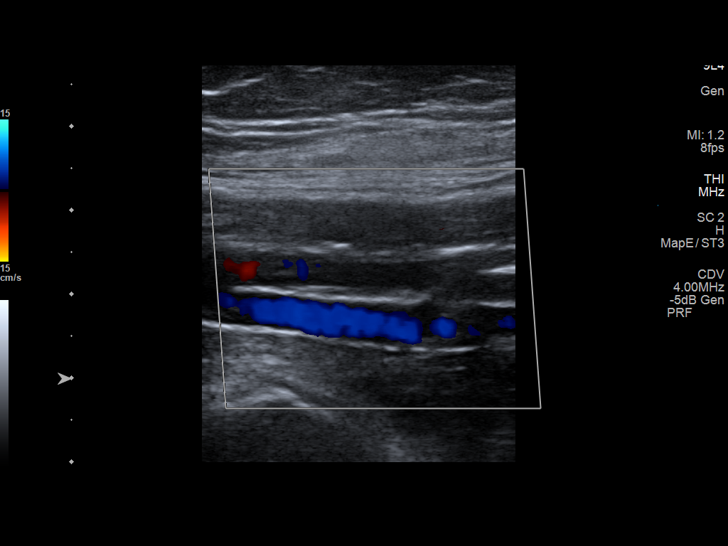
[im 48/65]
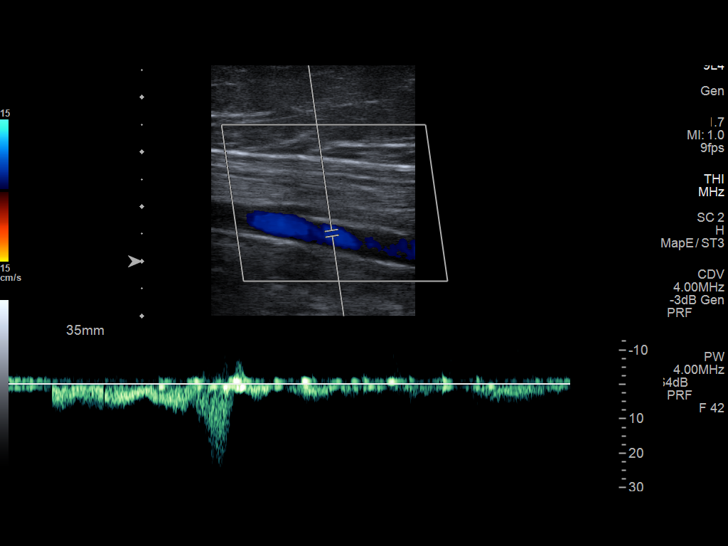
[im 53/65]
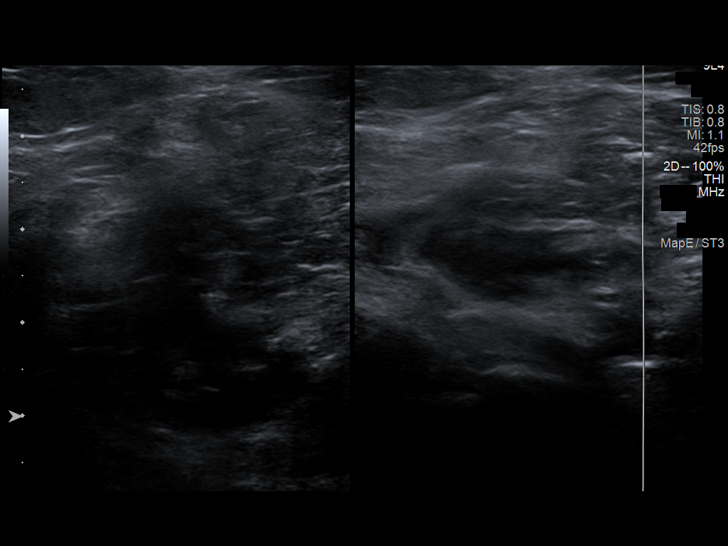
[im 59/65]
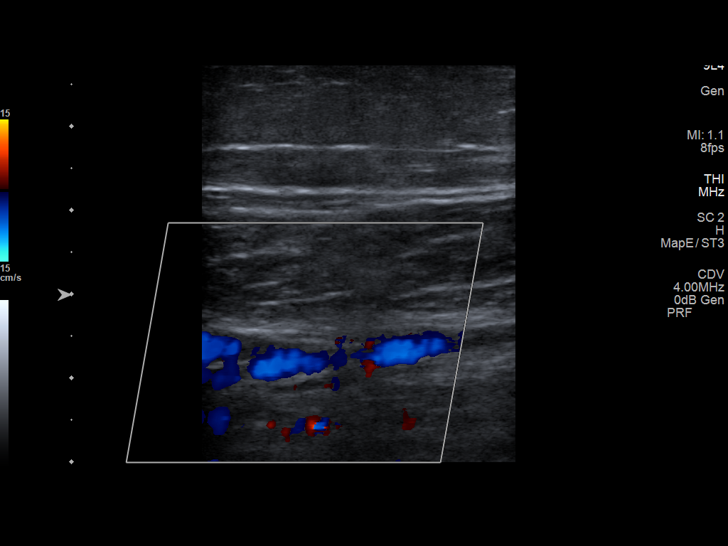
[im 65/65]
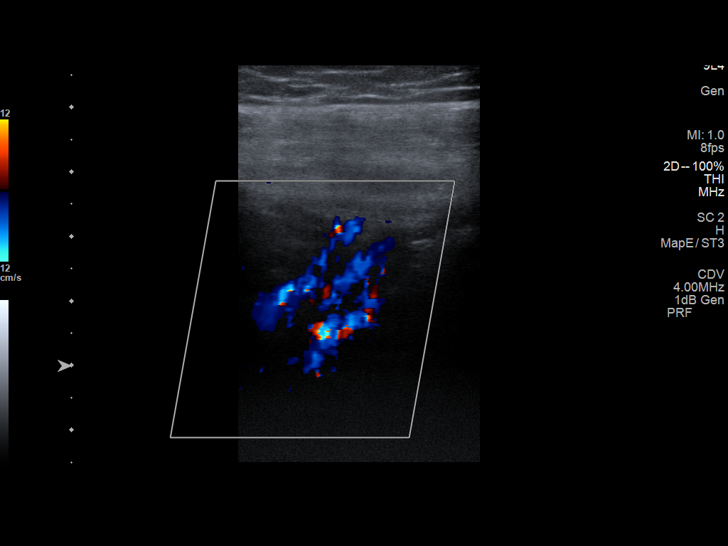

[13 of 24 positions shown; findings below may reference images not displayed]

FINDINGS: RIGHT LOWER EXTREMITY

Common Femoral Vein: No evidence of thrombus. Normal
compressibility, respiratory phasicity and response to augmentation.

Saphenofemoral Junction: No evidence of thrombus. Normal
compressibility and flow on color Doppler imaging.

Profunda Femoral Vein: No evidence of thrombus. Normal
compressibility and flow on color Doppler imaging.

Femoral Vein: No evidence of thrombus. Normal compressibility,
respiratory phasicity and response to augmentation.

Popliteal Vein: No evidence of thrombus. Normal compressibility,
respiratory phasicity and response to augmentation.

Calf Veins: No evidence of thrombus. Normal compressibility and flow
on color Doppler imaging.

Superficial Great Saphenous Vein: No evidence of thrombus. Normal
compressibility.

Venous Reflux:  None.

Other Findings:  None.

LEFT LOWER EXTREMITY

Common Femoral Vein: No evidence of thrombus. Normal
compressibility, respiratory phasicity and response to augmentation.

Saphenofemoral Junction: No evidence of thrombus. Normal
compressibility and flow on color Doppler imaging.

Profunda Femoral Vein: No evidence of thrombus. Normal
compressibility and flow on color Doppler imaging.

Femoral Vein: No evidence of thrombus. Normal compressibility,
respiratory phasicity and response to augmentation.

Popliteal Vein: No evidence of thrombus. Normal compressibility,
respiratory phasicity and response to augmentation.

Calf Veins: No evidence of thrombus. Normal compressibility and flow
on color Doppler imaging.

Superficial Great Saphenous Vein: No evidence of thrombus. Normal
compressibility.

Venous Reflux:  None.

Other Findings:  None.
IMPRESSION: No evidence of deep venous thrombosis in the lower extremities. Left
lower extremity DVT has resolved.

## 2019-12-13 ENCOUNTER — Telehealth: Payer: Medicare HMO

## 2019-12-14 ENCOUNTER — Other Ambulatory Visit: Payer: Self-pay

## 2019-12-14 ENCOUNTER — Ambulatory Visit (INDEPENDENT_AMBULATORY_CARE_PROVIDER_SITE_OTHER): Payer: Medicare HMO | Admitting: Family

## 2019-12-14 ENCOUNTER — Encounter: Payer: Self-pay | Admitting: Family

## 2019-12-14 VITALS — BP 155/68 | HR 68 | Temp 97.1°F | Ht 62.0 in | Wt 175.4 lb

## 2019-12-14 DIAGNOSIS — F320 Major depressive disorder, single episode, mild: Secondary | ICD-10-CM

## 2019-12-14 DIAGNOSIS — G4700 Insomnia, unspecified: Secondary | ICD-10-CM

## 2019-12-14 DIAGNOSIS — E669 Obesity, unspecified: Secondary | ICD-10-CM

## 2019-12-14 DIAGNOSIS — E785 Hyperlipidemia, unspecified: Secondary | ICD-10-CM | POA: Diagnosis not present

## 2019-12-14 DIAGNOSIS — I10 Essential (primary) hypertension: Secondary | ICD-10-CM | POA: Diagnosis not present

## 2019-12-14 DIAGNOSIS — Z86718 Personal history of other venous thrombosis and embolism: Secondary | ICD-10-CM | POA: Diagnosis not present

## 2019-12-14 DIAGNOSIS — Z23 Encounter for immunization: Secondary | ICD-10-CM | POA: Diagnosis not present

## 2019-12-14 DIAGNOSIS — E1169 Type 2 diabetes mellitus with other specified complication: Secondary | ICD-10-CM

## 2019-12-14 DIAGNOSIS — Z1211 Encounter for screening for malignant neoplasm of colon: Secondary | ICD-10-CM | POA: Diagnosis not present

## 2019-12-14 DIAGNOSIS — F411 Generalized anxiety disorder: Secondary | ICD-10-CM

## 2019-12-14 DIAGNOSIS — R69 Illness, unspecified: Secondary | ICD-10-CM | POA: Diagnosis not present

## 2019-12-14 LAB — BAYER DCA HB A1C WAIVED: HB A1C (BAYER DCA - WAIVED): 6.5 % (ref <7.0)

## 2019-12-14 MED ORDER — LISINOPRIL 40 MG PO TABS: 40.0000 mg | ORAL_TABLET | Freq: Every day | ORAL | 2 refills | Status: DC

## 2019-12-14 MED ORDER — ONETOUCH ULTRA VI STRP: 1.0000 | ORAL_STRIP | Freq: Four times a day (QID) | 6 refills | Status: AC

## 2019-12-14 MED ORDER — ONETOUCH DELICA PLUS LANCET33G MISC: 6 refills | Status: AC

## 2019-12-14 MED ORDER — TRAZODONE HCL 50 MG PO TABS: 50.0000 mg | ORAL_TABLET | Freq: Every evening | ORAL | 3 refills | Status: DC | PRN

## 2019-12-14 MED ORDER — APIXABAN 5 MG PO TABS: 5.0000 mg | ORAL_TABLET | Freq: Two times a day (BID) | ORAL | 3 refills | Status: DC

## 2019-12-14 MED ORDER — ATORVASTATIN CALCIUM 20 MG PO TABS: 20.0000 mg | ORAL_TABLET | Freq: Every day | ORAL | 3 refills | Status: DC

## 2019-12-14 NOTE — Patient Instructions (Signed)

## 2019-12-14 NOTE — Progress Notes (Signed)
Subjective:    Patient ID: Christina Gonzales, female    DOB: Aug 26, 1945, 74 y.o.   MRN: [id number]  Chief Complaint  Patient presents with  . Hypertension  . Diabetes   Pt presents to the office today for chronic follow up.She reports she is having a hard time with insomnia. She reports her husband has had multiple surgeries and is scheduled for a catheterization in the next few weeks.  Hypertension This is a chronic problem. The current episode started more than 1 year ago. The problem has been waxing and waning since onset. The problem is uncontrolled. Associated symptoms include anxiety and malaise/fatigue. Pertinent negatives include no blurred vision, peripheral edema or shortness of breath. Risk factors for coronary artery disease include dyslipidemia, diabetes mellitus, obesity and sedentary lifestyle. The current treatment provides moderate improvement. There is no history of CVA or heart failure.  Diabetes She presents for her follow-up diabetic visit. She has type 2 diabetes mellitus. Her disease course has been stable. Hypoglycemia symptoms include nervousness/anxiousness. Pertinent negatives for diabetes include no blurred vision and no foot paresthesias. There are no hypoglycemic complications. Symptoms are stable. Pertinent negatives for diabetic complications include no CVA. Risk factors for coronary artery disease include dyslipidemia, diabetes mellitus, hypertension, sedentary lifestyle and post-menopausal. She is following a generally healthy diet. Her overall blood glucose range is 130-140 mg/dl. An ACE inhibitor/angiotensin II receptor blocker is being taken. Eye exam is not current.  Anxiety Presents for follow-up visit. Symptoms include depressed mood, excessive worry, insomnia, nervous/anxious behavior and restlessness. Patient reports no irritability or shortness of breath. Symptoms occur most days. The severity of symptoms is moderate.    Hyperlipidemia This is a  chronic problem. The current episode started more than 1 year ago. The problem is controlled. Recent lipid tests were reviewed and are normal. Pertinent negatives include no shortness of breath. Current antihyperlipidemic treatment includes statins. The current treatment provides moderate improvement of lipids. There are no known risk factors for coronary artery disease.  Depression        This is a chronic problem.  The current episode started more than 1 year ago.   The onset quality is gradual.   The problem occurs intermittently.  Associated symptoms include insomnia and restlessness.  Past medical history includes anxiety.    HX DVT PT takes Eliquis 5 mg BID. Stable.    Review of Systems  Constitutional: Positive for malaise/fatigue. Negative for irritability.  Eyes: Negative for blurred vision.  Respiratory: Negative for shortness of breath.   Psychiatric/Behavioral: Positive for depression. The patient is nervous/anxious and has insomnia.   All other systems reviewed and are negative.      Objective:   Physical Exam Vitals reviewed.  Constitutional:      General: She is not in acute distress.    Appearance: She is well-developed.  HENT:     Head: Normocephalic and atraumatic.     Right Ear: Tympanic membrane normal.     Left Ear: Tympanic membrane normal.  Eyes:     Pupils: Pupils are equal, round, and reactive to light.  Neck:     Thyroid: No thyromegaly.  Cardiovascular:     Rate and Rhythm: Normal rate and regular rhythm.     Heart sounds: Normal heart sounds. No murmur heard.   Pulmonary:     Effort: Pulmonary effort is normal. No respiratory distress.     Breath sounds: Normal breath sounds. No wheezing.  Abdominal:     General: Bowel  sounds are normal. There is no distension.     Palpations: Abdomen is soft.     Tenderness: There is no abdominal tenderness.  Musculoskeletal:        General: No tenderness. Normal range of motion.     Cervical back: Normal range  of motion and neck supple.  Skin:    General: Skin is warm and dry.  Neurological:     Mental Status: She is alert and oriented to person, place, and time.     Cranial Nerves: No cranial nerve deficit.     Deep Tendon Reflexes: Reflexes are normal and symmetric.  Psychiatric:        Behavior: Behavior normal.        Thought Content: Thought content normal.        Judgment: Judgment normal.       BP (!) 157/67   Pulse 76   Temp (!) 97.1 F (36.2 C) (Temporal)   Ht 5\' 2"  (1.575 m)   Wt 175 lb 6.4 oz (79.6 kg)   BMI 32.08 kg/m      Assessment & Plan:  Christina Gonzales comes in today with chief complaint of Hypertension and Diabetes   Diagnosis and orders addressed:  1. Type 2 diabetes mellitus with other specified complication, without long-term current use of insulin (HCC) - Bayer DCA Hb A1c Waived - Lancets (ONETOUCH DELICA PLUS [id number]) [place]; Check twice a day and as needed  Dispense: 100 each; Refill: 6 - ONETOUCH ULTRA test strip; 1 each by Other route 4 (four) times daily.  Dispense: 100 each; Refill: 6 - Microalbumin / creatinine urine ratio  2. Need for immunization against influenza - Flu Vaccine QUAD High Dose(Fluad)  3. Essential hypertension, benign - lisinopril (ZESTRIL) 40 MG tablet; Take 1 tablet (40 mg total) by mouth daily. (Needs to be seen before next refill)  Dispense: 90 tablet; Refill: 2  4. Current mild episode of major depressive disorder without prior episode ([place])  5. GAD (generalized anxiety disorder)  6. Hyperlipidemia, unspecified hyperlipidemia type - atorvastatin (LIPITOR) 20 MG tablet; Take 1 tablet (20 mg total) by mouth daily.  Dispense: 90 tablet; Refill: 3  7. Obesity (BMI 30-39.9)  8. Colon cancer screening - Cologuard  9. Insomnia, unspecified type Will start trazodone today Sleep ritual  - traZODone (DESYREL) 50 MG tablet; Take 1-2 tablets (50-100 mg total) by mouth at bedtime as needed for sleep.  Dispense: 60  tablet; Refill: 3  10. History of deep vein thrombosis (DVT) of lower extremity - apixaban (ELIQUIS) 5 MG TABS tablet; Take 1 tablet (5 mg total) by mouth 2 (two) times daily.  Dispense: 180 tablet; Refill: 3   Labs pending Health Maintenance reviewed Diet and exercise encouraged  Follow up plan: 6 months    [name], FNP

## 2019-12-15 LAB — MICROALBUMIN / CREATININE URINE RATIO
Creatinine, Urine: 33.2 mg/dL
Microalb/Creat Ratio: <9 mg/g creat (ref 0–29)
Microalbumin, Urine: <3 ug/mL

## 2019-12-29 DIAGNOSIS — Z5329 Procedure and treatment not carried out because of patient's decision for other reasons: Secondary | ICD-10-CM | POA: Diagnosis not present

## 2019-12-29 DIAGNOSIS — R404 Transient alteration of awareness: Secondary | ICD-10-CM | POA: Diagnosis not present

## 2019-12-29 DIAGNOSIS — R059 Cough, unspecified: Secondary | ICD-10-CM | POA: Diagnosis not present

## 2019-12-29 DIAGNOSIS — G9389 Other specified disorders of brain: Secondary | ICD-10-CM | POA: Diagnosis not present

## 2019-12-29 DIAGNOSIS — R569 Unspecified convulsions: Secondary | ICD-10-CM | POA: Diagnosis not present

## 2019-12-29 DIAGNOSIS — R112 Nausea with vomiting, unspecified: Secondary | ICD-10-CM | POA: Diagnosis not present

## 2019-12-29 DIAGNOSIS — R58 Hemorrhage, not elsewhere classified: Secondary | ICD-10-CM | POA: Diagnosis not present

## 2019-12-29 DIAGNOSIS — G40909 Epilepsy, unspecified, not intractable, without status epilepticus: Secondary | ICD-10-CM | POA: Diagnosis not present

## 2019-12-29 DIAGNOSIS — E1165 Type 2 diabetes mellitus with hyperglycemia: Secondary | ICD-10-CM | POA: Diagnosis not present

## 2019-12-29 DIAGNOSIS — R519 Headache, unspecified: Secondary | ICD-10-CM | POA: Diagnosis not present

## 2019-12-29 DIAGNOSIS — G459 Transient cerebral ischemic attack, unspecified: Secondary | ICD-10-CM | POA: Diagnosis not present

## 2019-12-29 DIAGNOSIS — I10 Essential (primary) hypertension: Secondary | ICD-10-CM | POA: Diagnosis not present

## 2020-01-17 ENCOUNTER — Ambulatory Visit: Payer: Medicare HMO | Admitting: Licensed Clinical Social Worker

## 2020-01-17 DIAGNOSIS — F4321 Adjustment disorder with depressed mood: Secondary | ICD-10-CM

## 2020-01-17 DIAGNOSIS — E785 Hyperlipidemia, unspecified: Secondary | ICD-10-CM

## 2020-01-17 DIAGNOSIS — Z86718 Personal history of other venous thrombosis and embolism: Secondary | ICD-10-CM

## 2020-01-17 DIAGNOSIS — I10 Essential (primary) hypertension: Secondary | ICD-10-CM

## 2020-01-17 DIAGNOSIS — F411 Generalized anxiety disorder: Secondary | ICD-10-CM

## 2020-01-17 NOTE — Chronic Care Management (AMB) (Signed)
Chronic Care Management    Clinical Social Work Follow Up Note  01/17/2020 Name: Christina Gonzales MRN: [id number] DOB: [birth year]  Christina Gonzales is a 74 y.o. year old female who is a primary care patient of Sharion Balloon, FNP. The CCM team was consulted for assistance with [name] .   Review of patient status, including review of consultants reports, other relevant assessments, and collaboration with appropriate care team members and the patient's provider was performed as part of comprehensive patient evaluation and provision of chronic care management services.    SDOH (Social Determinants of Health) assessments performed: No; risk for depression; risk for tobacco use; risk for stress; risk for physical inactivity;   [place] Office Visit from 12/14/2019 in [place]  PHQ-9 Total Score 7       GAD 7 : Generalized Anxiety Score 10/18/2019 08/09/2015  Nervous, Anxious, on Edge 0 3  Control/stop worrying 0 1  Worry too much - different things 0 1  Trouble relaxing 1 2  Restless 1 1  Easily annoyed or irritable 0 0  Afraid - awful might happen 0 0  Total GAD 7 Score 2 8  Anxiety Difficulty Somewhat difficult Somewhat difficult    Outpatient Encounter Medications as of 01/17/2020  Medication Sig Note  . apixaban (ELIQUIS) 5 MG TABS tablet Take 1 tablet (5 mg total) by mouth 2 (two) times daily.   [name] atorvastatin (LIPITOR) 20 MG tablet Take 1 tablet (20 mg total) by mouth daily.   . blood glucose meter kit and supplies Dispense based on patient and insurance preference. Use up to four times daily as directed. (FOR ICD-10 E10.9, E11.9).   . Lancets (ONETOUCH DELICA PLUS [id number]) MISC Check twice a day and as needed   . lisinopril (ZESTRIL) 40 MG tablet Take 1 tablet (40 mg total) by mouth daily. (Needs to be seen before next refill)   . Multiple Vitamins tablet Take 1 tablet by mouth. Reported on 05/26/2015 04/15/2013: Received from:  [facility]  . ONETOUCH ULTRA test strip 1 each by Other route 4 (four) times daily.   [name] OVER THE COUNTER MEDICATION Tumeric for inflammation.   . traZODone (DESYREL) 50 MG tablet Take 1-2 tablets (50-100 mg total) by mouth at bedtime as needed for sleep.    No facility-administered encounter medications on file as of 01/17/2020.    Goals    .  Client stated:  " I want to talk with someone about grief issues and managing grief symptoms" (pt-stated)      Current Barriers:  [name] Grief issues related to past death of her son . Grief issues related to death of her brother 04/01/18) . Mental health issues in client with Chronic Diagnoses of GAD, HTN, Hyperlipidemia, Hx of DVT  Clinical Social Work Clinical Goal(s):  [name] Over the next 30 days, client will work with LCSW to address concerns related to grief issues of client and managing grief issues experienced by client.   Interventions:  Talked with Tana Conch, spouse of client about client needs Talked with Christina Gonzales about upcoming client medical appointments Talked with Christina Gonzales about pain issues of client Talked with Christina Gonzales about mobility issues of client Encouraged Christina Gonzales to call Dr. Lottie Dawson, Pharmacist to discuss medication needs  of client Talked with Christina Gonzales about appetite of client Talked with Christina Gonzales about grief issues of client Christina Gonzales said brother of client died about one and one half years ago) Talked  with Christina Gonzales about sleeping issues of client  Patient Self Care Activities:  . Self administers medications as prescribed . Attends all scheduled provider appointments . Performs ADL's independently  . Calls provider office as needed to ask medical questions regarding client medical needs.  Plan: Patient will talk  with RN CM [name] regarding medication cost issue of client LCSW will call client in next 4 weeks to further assess psychosocial needs of client. Patient will call LCSW as  needed to discuss grief management of client Client will attend medical appointments as scheduled LCSW to talk further with client about relaxation techniques to help client manage grief symptoms experienced.  Initial goal documentation    Follow Up Plan: LCSW to call client in next 4 weeks to further assess the psychosocial needs of client at that time  [name].Cedrik Heindl MSW, LCSW Licensed Clinical Social Worker [facility] [phone]

## 2020-01-17 NOTE — Patient Instructions (Addendum)
Licensed Clinical Social Worker Visit Information  Goals we discussed today:   Client stated:  " I want to talk with someone about grief issues and managing grief symptoms" (pt-stated)          Current Barriers:   Grief issues related to past death of her son  Grief issues related to death of her brother Mar 14, 2018)  Mental health issues in client with Chronic Diagnoses of GAD, HTN, Hyperlipidemia, Hx of DVT  Clinical Social Work Clinical Goal(s):   Over the next 30 days, client will work with LCSW to address concerns related to grief issues of client and managing grief issues experienced by client.   Interventions:  Talked with Christina Gonzales, spouse of client about client needs Talked with Christina Gonzales about upcoming client medical appointments Talked with Christina Gonzales about pain issues of client Talked with Christina Gonzales about mobility issues of client Encouraged Christina Gonzales to call Dr. Lottie Gonzales, Pharmacist to discuss medication needs  of client Talked with Christina Gonzales about appetite of client Talked with Christina Gonzales about grief issues of client Christina Gonzales said brother of client died about one and one half years ago) Talked with Christina Gonzales about sleeping issues of client  Patient Self Care Activities:   Self administers medications as prescribed  Attends all scheduled provider appointments  Performs ADL's independently   Calls provider office as needed to ask medical questions regarding client medical needs.  Plan: Patient will talk  with RN CM Christina Gonzales regarding medication cost issue of client LCSW will call client in next 4 weeks to further assess psychosocial needs of client. Patient will call LCSW as needed to discuss grief management of client Client will attend medical appointments as scheduled LCSW to talk further with client about relaxation techniques to help client manage grief symptoms experienced.  Initial goal documentation    Follow Up Plan: LCSW to call  client in next 4 weeks to further assess the psychosocial needs of client at that time  Materials Provided: No  The patient/Christina Gonzales, spouse of patient, verbalized understanding of instructions provided today and declined a print copy of patient instruction materials.   Christina Gonzales.Christina Gonzales MSW, LCSW Licensed Clinical Social Worker [facility] [phone]

## 2020-01-18 ENCOUNTER — Telehealth: Payer: Self-pay | Admitting: Family

## 2020-01-18 NOTE — Telephone Encounter (Signed)
Pt scheduled with Almyra Free 02/01/20 at 9:00.

## 2020-01-18 NOTE — Telephone Encounter (Signed)
Would like to speak to Christina Gonzales about eliquis and to see if she can get any help with it because she found out that it will be $69

## 2020-01-18 NOTE — Telephone Encounter (Signed)
I have not seen this patient--message was sent to me Patient assistance program for Eliquis is closed for this year  The best thing to do would be to send patient forms/eliquis RX to [employer].  There is no guarantee that medication will be covered, but we can try.  Patient should be reminded this process can take up to 3-4 weeks.  I can do this if she will schedule appt with me. 19min pharmacy clinic appt  Thanks!

## 2020-02-01 ENCOUNTER — Ambulatory Visit (INDEPENDENT_AMBULATORY_CARE_PROVIDER_SITE_OTHER): Payer: Medicare HMO | Admitting: Pharmacist

## 2020-02-01 ENCOUNTER — Other Ambulatory Visit: Payer: Self-pay

## 2020-02-01 DIAGNOSIS — Z86718 Personal history of other venous thrombosis and embolism: Secondary | ICD-10-CM

## 2020-02-01 MED ORDER — APIXABAN 5 MG PO TABS: 5.0000 mg | ORAL_TABLET | Freq: Two times a day (BID) | ORAL | 3 refills | Status: DC

## 2020-02-01 NOTE — Progress Notes (Signed)
    02/01/2020 Name: Christina Gonzales MRN: [id number] DOB: 1945-08-21   S:  74 YOFPresents formedication management and medication assistance for Eliquis. Patient is currently on eliquis for history of DVT (therapy for 2 yrs, lifelong since recurrent DVT)She is stable and tolerating medication.She has hit the coverage gap and is experiencing very high copays.  Insurance coverage/medication affordability:[employer] Geophysicist/field seismologist with medications.    O:  CBC    Component Value Date/Time   WBC 5.9 10/05/2019 0848   RBC 4.70 10/05/2019 0848   HGB 13.9 10/05/2019 0848   HGB 13.4 07/20/2019 1006   HCT 42.1 10/05/2019 0848   HCT 40.1 07/20/2019 1006   PLT 170 10/05/2019 0848   PLT 192 07/20/2019 1006   MCV 89.6 10/05/2019 0848   MCV 88 07/20/2019 1006   MCH 29.6 10/05/2019 0848   MCHC 33.0 10/05/2019 0848   RDW 12.4 10/05/2019 0848   RDW 12.8 07/20/2019 1006   LYMPHSABS 2.4 10/05/2019 0848   LYMPHSABS 2.9 07/20/2019 1006   MONOABS 0.5 10/05/2019 0848   EOSABS 0.2 10/05/2019 0848   EOSABS 0.2 07/20/2019 1006   BASOSABS 0.0 10/05/2019 0848   BASOSABS 0.0 07/20/2019 1006   GFR>60  A/P: Patient has been compliant with Eliquis, but cost continues to be of concern --currently in coverage gap. Offered medication assistance program through [facility] department to obtain medication assistance for Eliquis. Patient states she will think about this program and get back to me. No further action at this time.  Information about your medication: Anticoagulant  Generic Name (Brand): Apixaban (Eliquis)  Apixaban is used to reduce the risk of forming blood clots that cause a stroke due to an irregular heartbeat.  Common SIDE EFFECTS you may experience include: extremity pain and increased risk of bleeding or bruising.  This drug must be taken consistently as prescribed to maintain its effect.  Multiple drug-drug interactions exist for  this medication. Consult healthcare professional prior to starting a new drug.  Tell your physicians and dentists that you are taking this drug before elective surgery or invasive procedures and before any new drug is prescribed.  Contact your health care provider if you experience: any signs of blood loss or unusual bleeding.  Patient instructions provided. Total time in face to face counseling .    Kieth Brightly, PharmD, BCPS Clinical Pharmacist, [facility] [phone]

## 2020-02-22 ENCOUNTER — Ambulatory Visit: Payer: Medicare HMO | Admitting: Licensed Clinical Social Worker

## 2020-02-22 DIAGNOSIS — Z86718 Personal history of other venous thrombosis and embolism: Secondary | ICD-10-CM

## 2020-02-22 DIAGNOSIS — E785 Hyperlipidemia, unspecified: Secondary | ICD-10-CM

## 2020-02-22 DIAGNOSIS — I10 Essential (primary) hypertension: Secondary | ICD-10-CM

## 2020-02-22 DIAGNOSIS — F411 Generalized anxiety disorder: Secondary | ICD-10-CM

## 2020-02-22 NOTE — Chronic Care Management (AMB) (Signed)
Chronic Care Management    Clinical Social Work Follow Up Note  02/22/2020 Name: Christina Gonzales MRN: [id number] DOB: 1945-03-16  Christina Gonzales is a 75 y.o. year old female who is a primary care patient of Sharion Balloon, FNP. The CCM team was consulted for assistance with [name] .   Review of patient status, including review of consultants reports, other relevant assessments, and collaboration with appropriate care team members and the patient's provider was performed as part of comprehensive patient evaluation and provision of chronic care management services.    SDOH (Social Determinants of Health) assessments performed: No;risk for depression; risk for tobacco use; risk for stress; risk for financial strain; risk for physical inactivity  [place] Office Visit from 12/14/2019 in [place]  PHQ-9 Total Score 7     GAD 7 : Generalized Anxiety Score 10/18/2019 08/09/2015  Nervous, Anxious, on Edge 0 3  Control/stop worrying 0 1  Worry too much - different things 0 1  Trouble relaxing 1 2  Restless 1 1  Easily annoyed or irritable 0 0  Afraid - awful might happen 0 0  Total GAD 7 Score 2 8  Anxiety Difficulty Somewhat difficult Somewhat difficult    Outpatient Encounter Medications as of 02/22/2020  Medication Sig Note  . apixaban (ELIQUIS) 5 MG TABS tablet Take 1 tablet (5 mg total) by mouth 2 (two) times daily.   [name] atorvastatin (LIPITOR) 20 MG tablet Take 1 tablet (20 mg total) by mouth daily.   . blood glucose meter kit and supplies Dispense based on patient and insurance preference. Use up to four times daily as directed. (FOR ICD-10 E10.9, E11.9).   . Lancets (ONETOUCH DELICA PLUS [id number]) MISC Check twice a day and as needed   . lisinopril (ZESTRIL) 40 MG tablet Take 1 tablet (40 mg total) by mouth daily. (Needs to be seen before next refill)   . Multiple Vitamins tablet Take 1 tablet by mouth. Reported on 05/26/2015  04/15/2013: Received from: [facility]  . ONETOUCH ULTRA test strip 1 each by Other route 4 (four) times daily.   [name] OVER THE COUNTER MEDICATION Tumeric for inflammation.   . traZODone (DESYREL) 50 MG tablet Take 1-2 tablets (50-100 mg total) by mouth at bedtime as needed for sleep.    No facility-administered encounter medications on file as of 02/22/2020.    Goals    .  Client stated:  " I want to talk with someone about grief issues and managing grief symptoms" (pt-stated)      Current Barriers:  [name] Grief issues related to past death of her son . Grief issues related to death of her brother 21-Mar-2018) . Mental health issues in client with Chronic Diagnoses of GAD, HTN, Hyperlipidemia, Hx of DVT  Clinical Social Work Clinical Goal(s):  [name] Over the next 30 days, client will work with LCSW to address concerns related to grief issues of client and managing grief issues experienced by client.   Interventions:   Talked with client about pain issues of client Talked with client about upcoming client appointments Talked with client about medication costs (she said she talked with Dr Blanca Friend about Eliquis costs and options for obtaining Eliquis) Talked with client about sleeping issues of client Talked with client about appetite of client Talked with client about pain issues  Talked with client about transport needs of client Talked with client about health of her spouse Talked with client  about mobility of client Talked with client about grief issues of client Encouraged client to call RNCM as needed for nursing support   Patient Self Care Activities:  . Self administers medications as prescribed . Attends all scheduled provider appointments . Performs ADL's independently  . Calls provider office as needed to ask medical questions regarding client medical needs.  Plan: Patient will talk  with RN CM [name] regarding medication cost issue of client LCSW  will call client in next 4 weeks to further assess psychosocial needs of client. Patient will call LCSW as needed to discuss grief management of client Client will attend medical appointments as scheduled LCSW to talk further with client about relaxation techniques to help client manage grief symptoms experienced.  Initial goal documentation    Follow Up Plan: LCSW to call client in next 4 weeks to further assess the psychosocial needs of client at that time  [name].Arwin Bisceglia MSW, LCSW Licensed Clinical Social Worker [facility] [phone]

## 2020-02-22 NOTE — Patient Instructions (Addendum)
Licensed Clinical Social Worker Visit Information  Goals we discussed today:    .  Client stated:  " I want to talk with someone about grief issues and managing grief symptoms" (pt-stated)        Current Barriers:   Grief issues related to past death of her son  Grief issues related to death of her brother Feb 22, 2018)  Mental health issues in client with Chronic Diagnoses of GAD, HTN, Hyperlipidemia, Hx of DVT  Clinical Social Work Clinical Goal(s):   Over the next 30 days, client will work with LCSW to address concerns related to grief issues of client and managing grief issues experienced by client.   Interventions:   Talked with client about pain issues of client Talked with client about upcoming client appointments Talked with client about medication costs (she said she talked with Dr Blanca Friend about Eliquis costs and options for obtaining Eliquis) Talked with client about sleeping issues of client Talked with client about appetite of client Talked with client about pain issues  Talked with client about transport needs of client Talked with client about health of her spouse Talked with client about mobility of client Talked with client about grief issues of client Encouraged client to call RNCM as needed for nursing support   Patient Self Care Activities:   Self administers medications as prescribed  Attends all scheduled provider appointments  Performs ADL's independently   Calls provider office as needed to ask medical questions regarding client medical needs.  Plan: Patient will talk  with RN CM [name] regarding medication cost issue of client LCSW will call client in next 4 weeks to further assess psychosocial needs of client. Patient will call LCSW as needed to discuss grief management of client Client will attend medical appointments as scheduled LCSW to talk further with client about relaxation techniques to help client manage grief symptoms  experienced.  Initial goal documentation    Follow Up Plan:LCSW to call client in next 4 weeks to further assess the psychosocial needs of client at that time  Materials Provided: No  The patient verbalized understanding of instructions provided today and declined a print copy of patient instruction materials.   [name].Christina Gonzales MSW, LCSW Licensed Clinical Social Worker [facility] [phone]

## 2020-03-06 ENCOUNTER — Encounter: Payer: Self-pay | Admitting: Family

## 2020-03-06 ENCOUNTER — Ambulatory Visit (INDEPENDENT_AMBULATORY_CARE_PROVIDER_SITE_OTHER): Payer: Medicare HMO | Admitting: Family

## 2020-03-06 ENCOUNTER — Other Ambulatory Visit: Payer: Self-pay

## 2020-03-06 VITALS — BP 160/63 | HR 75 | Temp 98.8°F | Ht 62.0 in | Wt 174.8 lb

## 2020-03-06 DIAGNOSIS — R300 Dysuria: Secondary | ICD-10-CM | POA: Diagnosis not present

## 2020-03-06 LAB — URINALYSIS, COMPLETE
Bilirubin, UA: NEGATIVE
Glucose, UA: NEGATIVE
Ketones, UA: NEGATIVE
Leukocytes,UA: NEGATIVE
Nitrite, UA: NEGATIVE
Protein,UA: NEGATIVE
RBC, UA: NEGATIVE
Specific Gravity, UA: 1.015 (ref 1.005–1.030)
Urobilinogen, Ur: 0.2 mg/dL (ref 0.2–1.0)
pH, UA: 5.5 (ref 5.0–7.5)

## 2020-03-06 LAB — MICROSCOPIC EXAMINATION
Epithelial Cells (non renal): NONE SEEN /hpf (ref 0–10)
RBC, Urine: NONE SEEN /hpf (ref 0–2)

## 2020-03-06 NOTE — Patient Instructions (Signed)
Urinary Tract Infection, Adult  A urinary tract infection (UTI) is an infection of any part of the urinary tract. The urinary tract includes the kidneys, ureters, bladder, and urethra. These organs make, store, and get rid of urine in the body. An upper UTI affects the ureters and kidneys. A lower UTI affects the bladder and urethra. What are the causes? Most urinary tract infections are caused by bacteria in your genital area around your urethra, where urine leaves your body. These bacteria grow and cause inflammation of your urinary tract. What increases the risk? You are more likely to develop this condition if:  You have a urinary catheter that stays in place.  You are not able to control when you urinate or have a bowel movement (incontinence).  You are female and you: ? Use a spermicide or diaphragm for birth control. ? Have low estrogen levels. ? Are pregnant.  You have certain genes that increase your risk.  You are sexually active.  You take antibiotic medicines.  You have a condition that causes your flow of urine to slow down, such as: ? An enlarged prostate, if you are female. ? Blockage in your urethra. ? A kidney stone. ? A nerve condition that affects your bladder control (neurogenic bladder). ? Not getting enough to drink, or not urinating often.  You have certain medical conditions, such as: ? Diabetes. ? A weak disease-fighting system (immunesystem). ? Sickle cell disease. ? Gout. ? Spinal cord injury. What are the signs or symptoms? Symptoms of this condition include:  Needing to urinate right away (urgency).  Frequent urination. This may include small amounts of urine each time you urinate.  Pain or burning with urination.  Blood in the urine.  Urine that smells bad or unusual.  Trouble urinating.  Cloudy urine.  Vaginal discharge, if you are female.  Pain in the abdomen or the lower back. You may also have:  Vomiting or a decreased  appetite.  Confusion.  Irritability or tiredness.  A fever or chills.  Diarrhea. The first symptom in older adults may be confusion. In some cases, they may not have any symptoms until the infection has worsened. How is this diagnosed? This condition is diagnosed based on your medical history and a physical exam. You may also have other tests, including:  Urine tests.  Blood tests.  Tests for STIs (sexually transmitted infections). If you have had more than one UTI, a cystoscopy or imaging studies may be done to determine the cause of the infections. How is this treated? Treatment for this condition includes:  Antibiotic medicine.  Over-the-counter medicines to treat discomfort.  Drinking enough water to stay hydrated. If you have frequent infections or have other conditions such as a kidney stone, you may need to see a health care provider who specializes in the urinary tract (urologist). In rare cases, urinary tract infections can cause sepsis. Sepsis is a life-threatening condition that occurs when the body responds to an infection. Sepsis is treated in the hospital with IV antibiotics, fluids, and other medicines. Follow these instructions at home: Medicines  Take over-the-counter and prescription medicines only as told by your health care provider.  If you were prescribed an antibiotic medicine, take it as told by your health care provider. Do not stop using the antibiotic even if you start to feel better. General instructions  Make sure you: ? Empty your bladder often and completely. Do not hold urine for long periods of time. ? Empty your bladder after   sex. ? Wipe from front to back after urinating or having a bowel movement if you are female. Use each tissue only one time when you wipe.  Drink enough fluid to keep your urine pale yellow.  Keep all follow-up visits. This is important.   Contact a health care provider if:  Your symptoms do not get better after 1-2  days.  Your symptoms go away and then return. Get help right away if:  You have severe pain in your back or your lower abdomen.  You have a fever or chills.  You have nausea or vomiting. Summary  A urinary tract infection (UTI) is an infection of any part of the urinary tract, which includes the kidneys, ureters, bladder, and urethra.  Most urinary tract infections are caused by bacteria in your genital area.  Treatment for this condition often includes antibiotic medicines.  If you were prescribed an antibiotic medicine, take it as told by your health care provider. Do not stop using the antibiotic even if you start to feel better.  Keep all follow-up visits. This is important. This information is not intended to replace advice given to you by your health care provider. Make sure you discuss any questions you have with your health care provider. Document Revised: 09/03/2019 Document Reviewed: 09/03/2019 Elsevier Patient Education  2021 [employer]  

## 2020-03-06 NOTE — Progress Notes (Signed)
   Subjective:    Patient ID: Christina Gonzales, female    DOB: [birth year], 75 y.o.   MRN: [id number]  Chief Complaint  Patient presents with  . Chills    Urinary Frequency  This is a new problem. The current episode started 1 to 4 weeks ago. The problem occurs intermittently. The problem has been waxing and waning. The pain is at a severity of 0/10. The patient is experiencing no pain. Associated symptoms include frequency and urgency. Pertinent negatives include no discharge, flank pain, hematuria, hesitancy, nausea or vomiting. She has tried increased fluids for the symptoms. The treatment provided mild relief.      Review of Systems  Gastrointestinal: Negative for nausea and vomiting.  Genitourinary: Positive for frequency and urgency. Negative for flank pain, hematuria and hesitancy.  All other systems reviewed and are negative.      Objective:   Physical Exam Vitals reviewed.  Constitutional:      General: She is not in acute distress.    Appearance: She is well-developed and well-nourished.  HENT:     Head: Normocephalic and atraumatic.     Mouth/Throat:     Mouth: Oropharynx is clear and moist.  Eyes:     Pupils: Pupils are equal, round, and reactive to light.  Neck:     Thyroid: No thyromegaly.  Cardiovascular:     Rate and Rhythm: Normal rate and regular rhythm.     Pulses: Intact distal pulses.     Heart sounds: Normal heart sounds. No murmur heard.   Pulmonary:     Effort: Pulmonary effort is normal. No respiratory distress.     Breath sounds: Normal breath sounds. No wheezing.  Abdominal:     General: Bowel sounds are normal. There is no distension.     Palpations: Abdomen is soft.     Tenderness: There is no abdominal tenderness.  Musculoskeletal:        General: No tenderness or edema. Normal range of motion.     Cervical back: Normal range of motion and neck supple.  Skin:    General: Skin is warm and dry.  Neurological:     Mental Status: She  is alert and oriented to person, place, and time.     Cranial Nerves: No cranial nerve deficit.     Deep Tendon Reflexes: Reflexes are normal and symmetric.  Psychiatric:        Mood and Affect: Mood and affect normal.        Behavior: Behavior normal.        Thought Content: Thought content normal.        Judgment: Judgment normal.     BP (!) 160/63   Pulse 75   Temp 98.8 F (37.1 C) (Temporal)   Ht 5\' 2"  (1.575 m)   Wt 174 lb 12.8 oz (79.3 kg)   SpO2 95%   BMI 31.97 kg/m        Assessment & Plan:  EVETT KASSA comes in today with chief complaint of Chills   Diagnosis and orders addressed:  1. Dysuria Force fluids Urine Culture pending - Urinalysis, Complete - Urine Culture   [name], FNP

## 2020-03-09 LAB — URINE CULTURE

## 2020-03-10 ENCOUNTER — Other Ambulatory Visit: Payer: Self-pay | Admitting: Family

## 2020-03-10 MED ORDER — CEPHALEXIN 500 MG PO CAPS: 500.0000 mg | ORAL_CAPSULE | Freq: Two times a day (BID) | ORAL | 0 refills | Status: DC

## 2020-03-17 ENCOUNTER — Other Ambulatory Visit (HOSPITAL_COMMUNITY): Payer: Self-pay

## 2020-03-17 MED ORDER — ERGOCALCIFEROL 1.25 MG (50000 UT) PO CAPS
50000.0000 [IU] | ORAL_CAPSULE | ORAL | 0 refills | Status: DC
Start: 2020-03-17 — End: 2020-05-11

## 2020-03-27 ENCOUNTER — Ambulatory Visit (INDEPENDENT_AMBULATORY_CARE_PROVIDER_SITE_OTHER): Payer: Medicare HMO | Admitting: Licensed Clinical Social Worker

## 2020-03-27 DIAGNOSIS — E785 Hyperlipidemia, unspecified: Secondary | ICD-10-CM

## 2020-03-27 DIAGNOSIS — Z86718 Personal history of other venous thrombosis and embolism: Secondary | ICD-10-CM

## 2020-03-27 DIAGNOSIS — I10 Essential (primary) hypertension: Secondary | ICD-10-CM | POA: Diagnosis not present

## 2020-03-27 DIAGNOSIS — F411 Generalized anxiety disorder: Secondary | ICD-10-CM

## 2020-03-27 NOTE — Patient Instructions (Addendum)
Licensed Clinical Social Worker Visit Information  Goals we discussed today:  .  Client stated:  " I want to talk with someone about grief issues and managing grief symptoms" (pt-stated)        Current Barriers:   Grief issues related to past death of her son  Grief issues related to death of her brother 02-26-18)  Mental health issues in client with Chronic Diagnoses of GAD, HTN, Hyperlipidemia, Hx of DVT  Clinical Social Work Clinical Goal(s):   Over the next 30 days, client will work with LCSW to address concerns related to grief issues of client and managing grief issues experienced by client.   Interventions:  Talked with client about pain issues of client Talked with client about upcoming client appointments Talked with client about medication costs (she said she talked with Dr Blanca Friend about Eliquis costs and options for obtaining Eliquis) Talked with client about sleeping issues of client Talked with client about appetite of client Talked with client about health of her spouse  Talked with client about transport needs of client Talked with client about mobility of client Talked with client about grief issues of client Talked with client about [facility] and about Grief support services with [facility] [place], [place] Encouraged client to call [facility] as needed for nursing support  Patient Self Care Activities:   Self administers medications as prescribed  Attends all scheduled provider appointments  Performs ADL's independently   Calls provider office as needed to ask medical questions regarding client medical needs.  Plan: Patient will talk  with RN CM [name] regarding medication cost issue of client LCSW will call client in next 4 weeks to further assess psychosocial needs of client. Patient will call LCSW as needed to discuss grief management of client Client will attend medical appointments as scheduled LCSW to talk  further with client about relaxation techniques to help client manage grief symptoms experienced.  Initial goal documentation    Follow Up Plan:LCSW to call client in next 4 weeks to further assess the psychosocial needs of client at that time  Materials Provided: No  The patient verbalized understanding of instructions provided today and declined a print copy of patient instruction materials.   Christina Gonzales MSW, LCSW Licensed Clinical Social Worker [facility] Care Management [phone]

## 2020-03-27 NOTE — Chronic Care Management (AMB) (Signed)
Chronic Care Management    Clinical Social Work Follow Up Note  03/27/2020 Name: NAKEDA LEBRON MRN: [id number] DOB: 1945-12-22  CYLINDA SANTOLI is a 75 y.o. year old female who is a primary care patient of Sharion Balloon, FNP. The CCM team was consulted for assistance with [name] .   Review of patient status, including review of consultants reports, other relevant assessments, and collaboration with appropriate care team members and the patient's provider was performed as part of comprehensive patient evaluation and provision of chronic care management services.    SDOH (Social Determinants of Health) assessments performed: No; risk for depression; risk for tobacco use; risk for stress risk for physical inactivity  [place] Visit from 12/14/2019 in [place]  PHQ-9 Total Score 7      GAD 7 : Generalized Anxiety Score 10/18/2019 08/09/2015  Nervous, Anxious, on Edge 0 3  Control/stop worrying 0 1  Worry too much - different things 0 1  Trouble relaxing 1 2  Restless 1 1  Easily annoyed or irritable 0 0  Afraid - awful might happen 0 0  Total GAD 7 Score 2 8  Anxiety Difficulty Somewhat difficult Somewhat difficult    Outpatient Encounter Medications as of 03/27/2020  Medication Sig Note  . apixaban (ELIQUIS) 5 MG TABS tablet Take 1 tablet (5 mg total) by mouth 2 (two) times daily.   [name] atorvastatin (LIPITOR) 20 MG tablet Take 1 tablet (20 mg total) by mouth daily.   . blood glucose meter kit and supplies Dispense based on patient and insurance preference. Use up to four times daily as directed. (FOR ICD-10 E10.9, E11.9).   . cephALEXin (KEFLEX) 500 MG capsule Take 1 capsule (500 mg total) by mouth 2 (two) times daily.   . ergocalciferol (VITAMIN D2) 1.25 MG (50000 UT) capsule Take 1 capsule (50,000 Units total) by mouth once a week.   . Lancets (ONETOUCH DELICA PLUS [id number]) MISC Check twice a day and as needed   .  lisinopril (ZESTRIL) 40 MG tablet Take 1 tablet (40 mg total) by mouth daily. (Needs to be seen before next refill)   . Multiple Vitamins tablet Take 1 tablet by mouth. Reported on 05/26/2015 04/15/2013: Received from: [facility]  . ONETOUCH ULTRA test strip 1 each by Other route 4 (four) times daily.   [name] OVER THE COUNTER MEDICATION Tumeric for inflammation.   . traZODone (DESYREL) 50 MG tablet Take 1-2 tablets (50-100 mg total) by mouth at bedtime as needed for sleep.    No facility-administered encounter medications on file as of 03/27/2020.    Goals    .  Client stated:  " I want to talk with someone about grief issues and managing grief symptoms" (pt-stated)      Current Barriers:  [name] Grief issues related to past death of her son . Grief issues related to death of her brother 03-16-2018) . Mental health issues in client with Chronic Diagnoses of GAD, HTN, Hyperlipidemia, Hx of DVT  Clinical Social Work Clinical Goal(s):  [name] Over the next 30 days, client will work with LCSW to address concerns related to grief issues of client and managing grief issues experienced by client.   Interventions:  Talked with client about pain issues of client Talked with client about upcoming client appointments Talked with client about medication costs (she said she talked with Dr Blanca Friend about Eliquis costs and options for obtaining Eliquis) Talked with  client about sleeping issues of client Talked with client about appetite of client Talked with client about health of her spouse  Talked with client about transport needs of client Talked with client about mobility of client Talked with client about grief issues of client Talked with client about [facility] and about Grief support services with [facility] [place], [place] Encouraged client to call [facility] as needed for nursing support   Patient Self Care Activities:  . Self administers medications as  prescribed . Attends all scheduled provider appointments . Performs ADL's independently  . Calls provider office as needed to ask medical questions regarding client medical needs.  Plan: Patient will talk  with RN CM [name] regarding medication cost issue of client LCSW will call client in next 4 weeks to further assess psychosocial needs of client. Patient will call LCSW as needed to discuss grief management of client Client will attend medical appointments as scheduled LCSW to talk further with client about relaxation techniques to help client manage grief symptoms experienced.  Initial goal documentation    Follow Up Plan:LCSW to call client in next 4 weeks to further assess the psychosocial needs of client at that time  [name].Carrol Bondar MSW, LCSW Licensed Clinical Social Worker [facility] Care Management [phone]

## 2020-03-29 ENCOUNTER — Inpatient Hospital Stay (HOSPITAL_COMMUNITY): Payer: Medicare HMO

## 2020-03-29 ENCOUNTER — Ambulatory Visit (HOSPITAL_COMMUNITY): Payer: Medicare HMO

## 2020-04-05 ENCOUNTER — Ambulatory Visit (HOSPITAL_COMMUNITY): Payer: Medicare HMO | Admitting: Hematology

## 2020-04-26 ENCOUNTER — Ambulatory Visit (INDEPENDENT_AMBULATORY_CARE_PROVIDER_SITE_OTHER): Payer: Medicare HMO | Admitting: Licensed Clinical Social Worker

## 2020-04-26 DIAGNOSIS — F320 Major depressive disorder, single episode, mild: Secondary | ICD-10-CM | POA: Diagnosis not present

## 2020-04-26 DIAGNOSIS — F411 Generalized anxiety disorder: Secondary | ICD-10-CM

## 2020-04-26 DIAGNOSIS — E785 Hyperlipidemia, unspecified: Secondary | ICD-10-CM

## 2020-04-26 DIAGNOSIS — I10 Essential (primary) hypertension: Secondary | ICD-10-CM | POA: Diagnosis not present

## 2020-04-26 DIAGNOSIS — R69 Illness, unspecified: Secondary | ICD-10-CM | POA: Diagnosis not present

## 2020-04-26 DIAGNOSIS — Z86718 Personal history of other venous thrombosis and embolism: Secondary | ICD-10-CM

## 2020-04-26 NOTE — Patient Instructions (Signed)
Visit Information  PATIENT GOALS: Goals Addressed            This Visit's Progress   . Manage My Emotions       Timeframe:  Short-Term Goal Priority:  Medium Start Date:     04/26/20                        Expected End Date:       07/27/20                Follow Up Date 05/27/20    Manage My Emotions (Patient) ; Manage Grief issues faced    Why is this important?    When you are stressed, down or upset, your body reacts too.   For example, your blood pressure may get higher; you may have a headache or stomachache.   When your emotions get the best of you, your body's ability to fight off cold and flu gets weak.   These steps will help you manage your emotions.      Patient Self Care Activities:  . Self administers medications as prescribed . Attends all scheduled provider appointments . Performs ADL's independently . Calls provider office for new concerns or questions  Patient Coping Strengths:  . Family . Spirituality . Hopefulness  Patient Self Care Deficits:  [name] Grief challenges . Mobility issues occasionally  Patient Goals:  - spend time or talk with others every day - practice relaxation or meditation daily - keep a calendar with appointment dates  Follow Up Plan: LCSW to call client on 05/27/2020       [name].Clavin Ruhlman MSW, LCSW Licensed Clinical Social Worker [facility] Care Management [phone]

## 2020-04-26 NOTE — Chronic Care Management (AMB) (Signed)
Chronic Care Management    Clinical Social Work Note  04/26/2020 Name: Christina Gonzales MRN: [id number] DOB: March 04, 1945  Christina Gonzales is a 75 y.o. year old female who is a primary care patient of Sharion Balloon, FNP. The CCM team was consulted to assist the patient with chronic disease management and/or care coordination needs related to: [name] .   Engaged with patient by telephone for follow up visit in response to provider referral for social work chronic care management and care coordination services.   Consent to Services:  The patient was given information about Chronic Care Management services, agreed to services, and gave verbal consent prior to initiation of services.  Please see initial visit note for detailed documentation.   Patient agreed to services and consent obtained.   Assessment: Review of patient past medical history, allergies, medications, and health status, including review of relevant consultants reports was performed today as part of a comprehensive evaluation and provision of chronic care management and care coordination services.     SDOH (Social Determinants of Health) assessments and interventions performed:    Advanced Directives Status: See Vynca application for related entries.  CCM Care Plan  Allergies  Allergen Reactions  . Heparin Nausea And Vomiting  . Warfarin Other (See Comments)    unknown  . Penicillins Rash    Outpatient Encounter Medications as of 04/26/2020  Medication Sig Note  . apixaban (ELIQUIS) 5 MG TABS tablet Take 1 tablet (5 mg total) by mouth 2 (two) times daily.   [name] atorvastatin (LIPITOR) 20 MG tablet Take 1 tablet (20 mg total) by mouth daily.   . blood glucose meter kit and supplies Dispense based on patient and insurance preference. Use up to four times daily as directed. (FOR ICD-10 E10.9, E11.9).   . cephALEXin (KEFLEX) 500 MG capsule Take 1 capsule (500 mg total) by mouth 2 (two) times daily.   .  ergocalciferol (VITAMIN D2) 1.25 MG (50000 UT) capsule Take 1 capsule (50,000 Units total) by mouth once a week.   . Lancets (ONETOUCH DELICA PLUS [id number]) MISC Check twice a day and as needed   . lisinopril (ZESTRIL) 40 MG tablet Take 1 tablet (40 mg total) by mouth daily. (Needs to be seen before next refill)   . Multiple Vitamins tablet Take 1 tablet by mouth. Reported on 05/26/2015 04/15/2013: Received from: [facility]  . ONETOUCH ULTRA test strip 1 each by Other route 4 (four) times daily.   [name] OVER THE COUNTER MEDICATION Tumeric for inflammation.   . traZODone (DESYREL) 50 MG tablet Take 1-2 tablets (50-100 mg total) by mouth at bedtime as needed for sleep.    No facility-administered encounter medications on file as of 04/26/2020.    Patient Active Problem List   Diagnosis Date Noted  . History of deep vein thrombosis (DVT) of lower extremity 06/11/2018  . Current mild episode of major depressive disorder without prior episode ([place]) 03/03/2018  . Grief 03/03/2018  . Seborrheic keratosis 02/26/2017  . Obesity (BMI 30-39.9) 05/10/2016  . GAD (generalized anxiety disorder) 11/14/2014  . Hyperlipidemia 09/01/2013  . Essential hypertension, benign 09/01/2013  . Seizures ([place]) 09/01/2013    Conditions to be addressed/monitored: ; Monitor Grief issues of client  Care Plan : LCSW Care Plan  Updates made by Katha Cabal, LCSW since 04/26/2020 12:00 AM    Problem: Depression Identification (Depression)     Goal: Depressive Symptoms Identified   Start Date: 04/26/2020  Expected  End Date: 07/27/2020  This Visit's Progress: On track  Priority: Medium  Note:   Current Barriers:  . Chronic Mental Health needs related to anxiety, depression (experiencing grief issues) . Mental Health Concerns  . Suicidal Ideation/Homicidal Ideation: No  Clinical Social Work Goal(s):  . patient will work with SW monthly by telephone or in person to reduce or manage symptoms  related to anxiety or grief issues faced . patient will attend all scheduled medical appointments as evidenced by patient report and care team review of appointment completion in EMR:   . Patient will call RNCM or LCSW as needed for support  Interventions:  1:1 collaboration with Sharion Balloon, FNP regarding development and update of comprehensive plan of care as evidenced by provider attestation and co-signature . Provided patient with information about [facility] [place] [place] and grief support services with that agency . Encouraged client to attend scheduled client medical appointments . Talked with client about ambulation of client . Talked with client about upcoming medical appointments . Talked with client about transport issues . Provided counseling support for client . Talked with client about grief issues faced by client   Patient Self Care Activities:  . Self administers medications as prescribed . Attends all scheduled provider appointments . Performs ADL's independently . Calls provider office for new concerns or questions  Patient Coping Strengths:  . Family . Spirituality . Hopefulness  Patient Self Care Deficits:  [name] Grief challenges . Mobility issues occasionally  Patient Goals:  - spend time or talk with others every day - practice relaxation or meditation daily - keep a calendar with appointment dates  Follow Up Plan: LCSW to call client on 05/27/2020      [name].Matheu Ploeger MSW, LCSW Licensed Clinical Social Worker [facility] Care Management [phone]

## 2020-05-11 ENCOUNTER — Other Ambulatory Visit (HOSPITAL_COMMUNITY): Payer: Self-pay | Admitting: Hematology

## 2020-05-26 ENCOUNTER — Ambulatory Visit (INDEPENDENT_AMBULATORY_CARE_PROVIDER_SITE_OTHER): Payer: Medicare HMO | Admitting: Licensed Clinical Social Worker

## 2020-05-26 DIAGNOSIS — I10 Essential (primary) hypertension: Secondary | ICD-10-CM

## 2020-05-26 DIAGNOSIS — F4321 Adjustment disorder with depressed mood: Secondary | ICD-10-CM

## 2020-05-26 DIAGNOSIS — F320 Major depressive disorder, single episode, mild: Secondary | ICD-10-CM | POA: Diagnosis not present

## 2020-05-26 DIAGNOSIS — E785 Hyperlipidemia, unspecified: Secondary | ICD-10-CM | POA: Diagnosis not present

## 2020-05-26 DIAGNOSIS — Z86718 Personal history of other venous thrombosis and embolism: Secondary | ICD-10-CM

## 2020-05-26 DIAGNOSIS — F411 Generalized anxiety disorder: Secondary | ICD-10-CM

## 2020-05-26 DIAGNOSIS — R69 Illness, unspecified: Secondary | ICD-10-CM | POA: Diagnosis not present

## 2020-05-26 NOTE — Patient Instructions (Signed)
Visit Information  PATIENT GOALS: Goals Addressed            This Visit's Progress   . Manage My Emotions; Manage Grief issues; Manage depression issues       Timeframe:  Short-Term Goal Priority:  Medium Start Date:     04/26/20                        Expected End Date:       07/27/20                Follow Up Date 06/26/20    Manage My Emotions (Patient) ; Manage Grief issues faced    Why is this important?    When you are stressed, down or upset, your body reacts too.   For example, your blood pressure may get higher; you may have a headache or stomachache.   When your emotions get the best of you, your body's ability to fight off cold and flu gets weak.   These steps will help you manage your emotions.      Patient Self Care Activities:  . Self administers medications as prescribed . Attends all scheduled provider appointments . Performs ADL's independently . Calls provider office for new concerns or questions  Patient Coping Strengths:  . Family . Spirituality . Hopefulness  Patient Self Care Deficits:  [name] Grief challenges . Mobility issues occasionally  Patient Goals:  - spend time or talk with others every day - practice relaxation or meditation daily - keep a calendar with appointment dates  Follow Up Plan: LCSW to call client on 06/26/20       [name].Leira Regino MSW, LCSW Licensed Clinical Social Worker [facility] Care Management [phone]

## 2020-05-26 NOTE — Chronic Care Management (AMB) (Signed)
Chronic Care Management    Clinical Social Work Note  05/26/2020 Name: Christina Gonzales MRN: [id number] DOB: [birth year]  Christina Gonzales is a 75 y.o. year old female who is a primary care patient of Sharion Balloon, FNP. The CCM team was consulted to assist the patient with chronic disease management and/or care coordination needs related to: [name] .   Engaged with patient by telephone for follow up visit in response to provider referral for social work chronic care management and care coordination services.   Consent to Services:  The patient was given information about Chronic Care Management services, agreed to services, and gave verbal consent prior to initiation of services.  Please see initial visit note for detailed documentation.   Patient agreed to services and consent obtained.   Assessment: Review of patient past medical history, allergies, medications, and health status, including review of relevant consultants reports was performed today as part of a comprehensive evaluation and provision of chronic care management and care coordination services.     SDOH (Social Determinants of Health) assessments and interventions performed:  SDOH Interventions   Flowsheet Row Most Recent Value  SDOH Interventions   Depression Interventions/Treatment  Medication       Advanced Directives Status: See Vynca application for related entries.  CCM Care Plan  Allergies  Allergen Reactions  . Heparin Nausea And Vomiting  . Warfarin Other (See Comments)    unknown  . Penicillins Rash    Outpatient Encounter Medications as of 05/26/2020  Medication Sig Note  . apixaban (ELIQUIS) 5 MG TABS tablet Take 1 tablet (5 mg total) by mouth 2 (two) times daily.   [name] atorvastatin (LIPITOR) 20 MG tablet Take 1 tablet (20 mg total) by mouth daily.   . blood glucose meter kit and supplies Dispense based on patient and insurance preference. Use up to four times daily as directed.  (FOR ICD-10 E10.9, E11.9).   . cephALEXin (KEFLEX) 500 MG capsule Take 1 capsule (500 mg total) by mouth 2 (two) times daily.   . Lancets (ONETOUCH DELICA PLUS [id number]) MISC Check twice a day and as needed   . lisinopril (ZESTRIL) 40 MG tablet Take 1 tablet (40 mg total) by mouth daily. (Needs to be seen before next refill)   . Multiple Vitamins tablet Take 1 tablet by mouth. Reported on 05/26/2015 04/15/2013: Received from: [facility]  . ONETOUCH ULTRA test strip 1 each by Other route 4 (four) times daily.   [name] OVER THE COUNTER MEDICATION Tumeric for inflammation.   . traZODone (DESYREL) 50 MG tablet Take 1-2 tablets (50-100 mg total) by mouth at bedtime as needed for sleep.   . Vitamin D, Ergocalciferol, (DRISDOL) 1.25 MG (50000 UNIT) CAPS capsule Take 1 capsule by mouth once a week    No facility-administered encounter medications on file as of 05/26/2020.    Patient Active Problem List   Diagnosis Date Noted  . History of deep vein thrombosis (DVT) of lower extremity 06/11/2018  . Current mild episode of major depressive disorder without prior episode ([place]) 03/03/2018  . Grief 03/03/2018  . Seborrheic keratosis 02/26/2017  . Obesity (BMI 30-39.9) 05/10/2016  . GAD (generalized anxiety disorder) 11/14/2014  . Hyperlipidemia 09/01/2013  . Essential hypertension, benign 09/01/2013  . Seizures ([place]) 09/01/2013    Conditions to be addressed/monitored: Monitor client management of grief issues; monitor client management of depression issues   Care Plan : LCSW Care Plan  Updates made by Nunzio Cobbs  S, LCSW since 05/26/2020 12:00 AM    Problem: Depression Identification (Depression)     Goal: Depressive Symptoms Identified;Manage Grief issues; Manage Depression issues   Start Date: 04/26/2020  Expected End Date: 07/27/2020  This Visit's Progress: On track  Recent Progress: On track  Priority: Medium  Note:   Current Barriers:  . Chronic Mental Health needs  related to anxiety, depression (experiencing grief issues) . Mental Health Concerns  . Suicidal Ideation/Homicidal Ideation: No  Clinical Social Work Goal(s):  . patient will work with SW monthly by telephone or in person to reduce or manage symptoms related to anxiety or grief issues faced . patient will attend all scheduled medical appointments as evidenced by patient report and care team review of appointment completion in EMR:   . Patient will call RNCM or LCSW as needed for support in next 30 days  Interventions:  1:1 collaboration with Sharion Balloon, FNP regarding development and update of comprehensive plan of care as evidenced by provider attestation and co-signature . Talked with client about her upcoming client medical appointments . Talked with client about transport issues (she drives herself to appointments as needed) . Talked with client about her part time job (she said she works 3 days weekly and enjoys her part time job) . Talked with client about medication procurement  Patient Self Care Activities:  . Self administers medications as prescribed . Attends all scheduled provider appointments . Performs ADL's independently . Calls provider office for new concerns or questions  Patient Coping Strengths:  . Family . Spirituality . Hopefulness  Patient Self Care Deficits:  [name] Grief challenges . Mobility issues occasionally  Patient Goals:  - spend time or talk with others every day - practice relaxation or meditation daily - keep a calendar with appointment dates  Follow Up Plan: LCSW to call client on 06/26/20     [name].Matheson Vandehei MSW, LCSW Licensed Clinical Social Worker [facility] Care Management [phone]

## 2020-06-12 ENCOUNTER — Encounter: Payer: Self-pay | Admitting: Family Medicine

## 2020-06-26 ENCOUNTER — Telehealth: Payer: Medicare HMO

## 2020-06-27 ENCOUNTER — Encounter: Payer: Self-pay | Admitting: Family

## 2020-06-27 ENCOUNTER — Other Ambulatory Visit: Payer: Self-pay

## 2020-06-27 ENCOUNTER — Ambulatory Visit (INDEPENDENT_AMBULATORY_CARE_PROVIDER_SITE_OTHER): Payer: Medicare HMO | Admitting: Family

## 2020-06-27 VITALS — BP 153/77 | HR 75 | Temp 97.9°F | Ht 62.0 in | Wt 182.2 lb

## 2020-06-27 DIAGNOSIS — G4700 Insomnia, unspecified: Secondary | ICD-10-CM

## 2020-06-27 DIAGNOSIS — M79605 Pain in left leg: Secondary | ICD-10-CM

## 2020-06-27 DIAGNOSIS — Z86718 Personal history of other venous thrombosis and embolism: Secondary | ICD-10-CM

## 2020-06-27 DIAGNOSIS — I10 Essential (primary) hypertension: Secondary | ICD-10-CM

## 2020-06-27 DIAGNOSIS — E785 Hyperlipidemia, unspecified: Secondary | ICD-10-CM

## 2020-06-27 DIAGNOSIS — E669 Obesity, unspecified: Secondary | ICD-10-CM

## 2020-06-27 DIAGNOSIS — R7309 Other abnormal glucose: Secondary | ICD-10-CM | POA: Diagnosis not present

## 2020-06-27 DIAGNOSIS — F411 Generalized anxiety disorder: Secondary | ICD-10-CM

## 2020-06-27 DIAGNOSIS — R69 Illness, unspecified: Secondary | ICD-10-CM | POA: Diagnosis not present

## 2020-06-27 DIAGNOSIS — Z1211 Encounter for screening for malignant neoplasm of colon: Secondary | ICD-10-CM | POA: Diagnosis not present

## 2020-06-27 DIAGNOSIS — F320 Major depressive disorder, single episode, mild: Secondary | ICD-10-CM

## 2020-06-27 MED ORDER — LISINOPRIL 40 MG PO TABS: 40.0000 mg | ORAL_TABLET | Freq: Every day | ORAL | 2 refills | Status: DC

## 2020-06-27 MED ORDER — BUSPIRONE HCL 5 MG PO TABS: 5.0000 mg | ORAL_TABLET | Freq: Three times a day (TID) | ORAL | 2 refills | Status: DC | PRN

## 2020-06-27 MED ORDER — DOXEPIN HCL 10 MG PO CAPS: 10.0000 mg | ORAL_CAPSULE | Freq: Every day | ORAL | 1 refills | Status: DC

## 2020-06-27 NOTE — Progress Notes (Signed)
Subjective:    Patient ID: Christina Gonzales, female    DOB: 10-21-1945, 75 y.o.   MRN: [id number]  Chief Complaint  Patient presents with  . Diabetes  . Hypertension  . Leg Pain    Left leg woke her up in the night wants doppler     Pt presents to the office today for chronic follow up.She reports she woke up this morning with left leg pain that woke up her on a scale of 7 out 10. It has resolved now. She has hx of DVT and takes Eliquis BID.  Diabetes She presents for her follow-up diabetic visit. She has type 2 diabetes mellitus. Her disease course has been stable. Hypoglycemia symptoms include nervousness/anxiousness. Pertinent negatives for diabetes include no blurred vision and no foot paresthesias. Symptoms are stable. Diabetic complications include heart disease. Risk factors for coronary artery disease include dyslipidemia, diabetes mellitus, hypertension and sedentary lifestyle. She is following a generally healthy diet. (Does not check BS at home)  Hypertension This is a chronic problem. The current episode started more than 1 year ago. The problem has been waxing and waning since onset. The problem is uncontrolled. Associated symptoms include anxiety and malaise/fatigue. Pertinent negatives include no blurred vision, peripheral edema or shortness of breath. Risk factors for coronary artery disease include dyslipidemia, obesity and sedentary lifestyle. Past treatments include ACE inhibitors. The current treatment provides mild improvement.  Leg Pain   Insomnia Primary symptoms: difficulty falling asleep, frequent awakening, malaise/fatigue.  The current episode started more than one year. The onset quality is gradual. The problem occurs nightly. PMH includes: depression.  Anxiety Presents for follow-up visit. Symptoms include depressed mood, excessive worry, insomnia, irritability, nervous/anxious behavior and restlessness. Patient reports no shortness of breath.     Depression        This is a chronic problem.  The current episode started more than 1 year ago.   The onset quality is gradual.   Associated symptoms include insomnia, irritable, restlessness and sad.  Associated symptoms include no helplessness and no hopelessness.  Past medical history includes anxiety.       Review of Systems  Constitutional: Positive for irritability and malaise/fatigue.  Eyes: Negative for blurred vision.  Respiratory: Negative for shortness of breath.   Psychiatric/Behavioral: Positive for depression. The patient is nervous/anxious and has insomnia.   All other systems reviewed and are negative.      Objective:   Physical Exam Vitals reviewed.  Constitutional:      General: She is irritable. She is not in acute distress.    Appearance: She is well-developed. She is obese.  HENT:     Head: Normocephalic and atraumatic.     Right Ear: Tympanic membrane normal.     Left Ear: Tympanic membrane normal.  Eyes:     Pupils: Pupils are equal, round, and reactive to light.  Neck:     Thyroid: No thyromegaly.  Cardiovascular:     Rate and Rhythm: Normal rate and regular rhythm.     Heart sounds: Normal heart sounds. No murmur heard.   Pulmonary:     Effort: Pulmonary effort is normal. No respiratory distress.     Breath sounds: Normal breath sounds. No wheezing.  Abdominal:     General: Bowel sounds are normal. There is no distension.     Palpations: Abdomen is soft.     Tenderness: There is no abdominal tenderness.  Musculoskeletal:        General: No tenderness. Normal  range of motion.     Cervical back: Normal range of motion and neck supple.     Comments: Negative for homan's sign, no redness, swelling, warmth, or pain  Skin:    General: Skin is warm and dry.  Neurological:     Mental Status: She is alert and oriented to person, place, and time.     Cranial Nerves: No cranial nerve deficit.     Deep Tendon Reflexes: Reflexes are normal and  symmetric.  Psychiatric:        Behavior: Behavior normal.        Thought Content: Thought content normal.        Judgment: Judgment normal.     BP (!) 159/69   Pulse 75   Temp 97.9 F (36.6 C) (Temporal)   Ht '5\' 2"'  (1.575 m)   Wt 182 lb 3.2 oz (82.6 kg)   SpO2 96%   BMI 33.32 kg/m      Assessment & Plan:  Christina Gonzales comes in today with chief complaint of Diabetes, Hypertension, and Leg Pain (Left leg woke her up in the night wants doppler )   Diagnosis and orders addressed:  1. Essential hypertension, benign - lisinopril (ZESTRIL) 40 MG tablet; Take 1 tablet (40 mg total) by mouth daily.  Dispense: 90 tablet; Refill: 2 - CMP14+EGFR - CBC with Differential/Platelet  2. Obesity (BMI 30-39.9) - CMP14+EGFR - CBC with Differential/Platelet  3. Hyperlipidemia, unspecified hyperlipidemia type - CMP14+EGFR - CBC with Differential/Platelet  4. History of deep vein thrombosis (DVT) of lower extremity - US Venous Img Lower Unilateral Left; Future - CMP14+EGFR - CBC with Differential/Platelet  5. GAD (generalized anxiety disorder) Will add buspar 5 mg TID as needed Stress management RTO in 4-6 weeks - busPIRone (BUSPAR) 5 MG tablet; Take 1 tablet (5 mg total) by mouth 3 (three) times daily as needed.  Dispense: 90 tablet; Refill: 2 - CMP14+EGFR - CBC with Differential/Platelet  6. Current mild episode of major depressive disorder without prior episode (HCC) - CMP14+EGFR - CBC with Differential/Platelet  7. Left leg pain I do not believe this is DVT, but patient very anxious  - US Venous Img Lower Unilateral Left; Future - CMP14+EGFR - CBC with Differential/Platelet  8. Insomnia, unspecified type Start doxepin - doxepin (SINEQUAN) 10 MG capsule; Take 1 capsule (10 mg total) by mouth at bedtime.  Dispense: 90 capsule; Refill: 1 - CMP14+EGFR - CBC with Differential/Platelet  9. Colon cancer screening - Cologuard - CMP14+EGFR - CBC with  Differential/Platelet   Labs pending Health Maintenance reviewed Diet and exercise encouraged  Follow up plan: 4-6 weeks   Christina Dun, FNP

## 2020-06-27 NOTE — Patient Instructions (Signed)

## 2020-06-28 ENCOUNTER — Telehealth: Payer: Self-pay | Admitting: Family Medicine

## 2020-06-28 LAB — CMP14+EGFR
ALT: 44 IU/L — ABNORMAL HIGH (ref 0–32)
AST: 35 IU/L (ref 0–40)
Albumin/Globulin Ratio: 1.6 (ref 1.2–2.2)
Albumin: 4.2 g/dL (ref 3.7–4.7)
Alkaline Phosphatase: 43 IU/L — ABNORMAL LOW (ref 44–121)
BUN/Creatinine Ratio: 30 — ABNORMAL HIGH (ref 12–28)
BUN: 22 mg/dL (ref 8–27)
Bilirubin Total: 0.3 mg/dL (ref 0.0–1.2)
CO2: 18 mmol/L — ABNORMAL LOW (ref 20–29)
Calcium: 9.2 mg/dL (ref 8.7–10.3)
Chloride: 104 mmol/L (ref 96–106)
Creatinine, Ser: 0.73 mg/dL (ref 0.57–1.00)
Globulin, Total: 2.6 g/dL (ref 1.5–4.5)
Glucose: 198 mg/dL — ABNORMAL HIGH (ref 65–99)
Potassium: 4.2 mmol/L (ref 3.5–5.2)
Sodium: 140 mmol/L (ref 134–144)
Total Protein: 6.8 g/dL (ref 6.0–8.5)
eGFR: 86 mL/min/{1.73_m2} (ref >59)

## 2020-06-28 LAB — CBC WITH DIFFERENTIAL/PLATELET
Basophils Absolute: 0 10*3/uL (ref 0.0–0.2)
Basos: 1 %
EOS (ABSOLUTE): 0.1 10*3/uL (ref 0.0–0.4)
Eos: 2 %
Hematocrit: 40 % (ref 34.0–46.6)
Hemoglobin: 13.3 g/dL (ref 11.1–15.9)
Immature Grans (Abs): 0 10*3/uL (ref 0.0–0.1)
Immature Granulocytes: 0 %
Lymphocytes Absolute: 2.9 10*3/uL (ref 0.7–3.1)
Lymphs: 40 %
MCH: 29.8 pg (ref 26.6–33.0)
MCHC: 33.3 g/dL (ref 31.5–35.7)
MCV: 90 fL (ref 79–97)
Monocytes Absolute: 0.5 10*3/uL (ref 0.1–0.9)
Monocytes: 7 %
Neutrophils Absolute: 3.6 10*3/uL (ref 1.4–7.0)
Neutrophils: 50 %
Platelets: 183 10*3/uL (ref 150–450)
RBC: 4.47 x10E6/uL (ref 3.77–5.28)
RDW: 12.2 % (ref 11.7–15.4)
WBC: 7.1 10*3/uL (ref 3.4–10.8)

## 2020-06-28 NOTE — Telephone Encounter (Signed)
Your PA request has been sent to [facility].

## 2020-06-28 NOTE — Telephone Encounter (Signed)
PA Denied

## 2020-06-29 NOTE — Telephone Encounter (Signed)
Lmtcb.

## 2020-06-29 NOTE — Telephone Encounter (Signed)
Patient picked up and paid out of pocket

## 2020-06-29 NOTE — Telephone Encounter (Signed)
Insurance denied Doxepin. Has she tried the Buspar? Has this helped with anxiety, does it make you sleepy?

## 2020-07-04 LAB — HGB A1C W/O EAG: Hgb A1c MFr Bld: 7 % — ABNORMAL HIGH (ref 4.8–5.6)

## 2020-07-04 LAB — SPECIMEN STATUS REPORT

## 2020-07-05 ENCOUNTER — Telehealth: Payer: Self-pay | Admitting: Family

## 2020-07-05 NOTE — Telephone Encounter (Signed)
R/C to [facility] about her [place]

## 2020-07-10 ENCOUNTER — Other Ambulatory Visit: Payer: Self-pay

## 2020-07-10 ENCOUNTER — Ambulatory Visit (HOSPITAL_COMMUNITY)
Admission: RE | Admit: 2020-07-10 | Discharge: 2020-07-10 | Disposition: A | Discharge status: Home / Self Care | Payer: Medicare HMO | Source: Ambulatory Visit | Attending: Family | Admitting: Family

## 2020-07-10 DIAGNOSIS — Z1211 Encounter for screening for malignant neoplasm of colon: Secondary | ICD-10-CM | POA: Diagnosis not present

## 2020-07-10 DIAGNOSIS — M79605 Pain in left leg: Secondary | ICD-10-CM | POA: Insufficient documentation

## 2020-07-10 DIAGNOSIS — M79662 Pain in left lower leg: Secondary | ICD-10-CM | POA: Diagnosis not present

## 2020-07-10 DIAGNOSIS — Z86718 Personal history of other venous thrombosis and embolism: Secondary | ICD-10-CM | POA: Diagnosis not present

## 2020-07-21 LAB — COLOGUARD: Cologuard: NEGATIVE

## 2020-07-27 ENCOUNTER — Telehealth: Payer: Medicare HMO

## 2020-07-31 ENCOUNTER — Telehealth: Payer: Self-pay | Admitting: Family Medicine

## 2020-07-31 NOTE — Telephone Encounter (Signed)
Approvedtoday  Your request has been approved  

## 2020-07-31 NOTE — Telephone Encounter (Signed)
PA sent today to plan for test strips.

## 2020-07-31 NOTE — Telephone Encounter (Signed)
Your information has been submitted to [employer]. [employer] will review the request and will issue a decision, typically within 1-3 days from your submission. You can check the updated outcome later by reopening this request.  If [employer] has not responded in 1-3 days or if you have any questions about your ePA request, please contact [employer] at [phone]. If you think there may be a problem with your PA request, use our live chat feature at the bottom right.

## 2020-08-01 ENCOUNTER — Other Ambulatory Visit: Payer: Self-pay

## 2020-08-01 ENCOUNTER — Encounter: Payer: Self-pay | Admitting: Family

## 2020-08-01 ENCOUNTER — Ambulatory Visit (INDEPENDENT_AMBULATORY_CARE_PROVIDER_SITE_OTHER): Payer: Medicare HMO | Admitting: Family

## 2020-08-01 VITALS — BP 160/71 | HR 75 | Temp 98.1°F | Ht 62.0 in | Wt 178.8 lb

## 2020-08-01 DIAGNOSIS — F411 Generalized anxiety disorder: Secondary | ICD-10-CM | POA: Diagnosis not present

## 2020-08-01 DIAGNOSIS — R69 Illness, unspecified: Secondary | ICD-10-CM | POA: Diagnosis not present

## 2020-08-01 NOTE — Progress Notes (Signed)
   Subjective:    Patient ID: Christina Gonzales, female    DOB: March 14, 1945, 75 y.o.   MRN: [id number]  Chief Complaint  Patient presents with   Anxiety    Wast started on buspar 5mg  tid patient states it is working well for her. Pt declined a dexa scan today.   Pt presents to the office today to recheck GAD. She was started on Buspar 5 mg TID prn. She reports this has greatly helped and only takes as needed.  Anxiety Presents for follow-up visit. Symptoms include depressed mood, excessive worry, irritability, nervous/anxious behavior and restlessness. Symptoms occur occasionally. The severity of symptoms is moderate.       Review of Systems  Constitutional:  Positive for irritability.  Psychiatric/Behavioral:  The patient is nervous/anxious.   All other systems reviewed and are negative.     Objective:   Physical Exam Vitals reviewed.  Constitutional:      General: She is not in acute distress.    Appearance: She is well-developed.  HENT:     Head: Normocephalic and atraumatic.     Right Ear: Tympanic membrane normal.     Left Ear: Tympanic membrane normal.  Eyes:     Pupils: Pupils are equal, round, and reactive to light.  Neck:     Thyroid: No thyromegaly.  Cardiovascular:     Rate and Rhythm: Normal rate and regular rhythm.     Heart sounds: Normal heart sounds. No murmur heard. Pulmonary:     Effort: Pulmonary effort is normal. No respiratory distress.     Breath sounds: Normal breath sounds. No wheezing.  Abdominal:     General: Bowel sounds are normal. There is no distension.     Palpations: Abdomen is soft.     Tenderness: There is no abdominal tenderness.  Musculoskeletal:        General: No tenderness. Normal range of motion.     Cervical back: Normal range of motion and neck supple.  Skin:    General: Skin is warm and dry.  Neurological:     Mental Status: She is alert and oriented to person, place, and time.     Cranial Nerves: No cranial nerve  deficit.     Deep Tendon Reflexes: Reflexes are normal and symmetric.  Psychiatric:        Behavior: Behavior normal.        Thought Content: Thought content normal.        Judgment: Judgment normal.      BP (!) 160/71   Pulse 75   Temp 98.1 F (36.7 C) (Temporal)   Ht 5\' 2"  (1.575 m)   Wt 178 lb 12.8 oz (81.1 kg)   BMI 32.70 kg/m      Assessment & Plan:  Christina Gonzales comes in today with chief complaint of Anxiety (Wast started on buspar 5mg  tid patient states it is working well for her. Pt declined a dexa scan today.)   Diagnosis and orders addressed:  1. GAD (generalized anxiety disorder) Continue Buspar  Stress management  Encourage healthy diet and exercise  RTO in 3-6 months    [name], FNP

## 2020-08-01 NOTE — Patient Instructions (Signed)

## 2020-08-10 ENCOUNTER — Ambulatory Visit (INDEPENDENT_AMBULATORY_CARE_PROVIDER_SITE_OTHER): Payer: Medicare HMO

## 2020-08-10 DIAGNOSIS — Z0000 Encounter for general adult medical examination without abnormal findings: Secondary | ICD-10-CM | POA: Diagnosis not present

## 2020-08-10 NOTE — Progress Notes (Signed)
MEDICARE ANNUAL WELLNESS VISIT  08/10/2020  Telephone Visit Disclaimer This Medicare AWV was conducted by telephone due to national recommendations for restrictions regarding the COVID-19 Pandemic (e.g. social distancing).  I verified, using two identifiers, that I am speaking with Christina Gonzales or their authorized healthcare agent. I discussed the limitations, risks, security, and privacy concerns of performing an evaluation and management service by telephone and the potential availability of an in-person appointment in the future. The patient expressed understanding and agreed to proceed.  Location of Patient: Home Location of Provider (nurse):  [facility]  Subjective:    Christina Gonzales is a 75 y.o. female patient of [facility], Theador Hawthorne, [place] who had a Medicare Annual Wellness Visit today via telephone. Christina Gonzales is Retired and lives with their spouse. She has two children. She reports that she is socially active and does interact with friends/family regularly. She is minimally physically active and enjoys walking, being on her computer, researching genealogy, and spending time with friends.  Patient Care Team: Sharion Balloon, FNP as PCP - General (Family Medicine) Gala Romney, Cristopher Estimable, MD as Consulting Physician (Gastroenterology) Shea Evans, [name], LCSW as Social Worker (Licensed Clinical Social Worker) Lavera Guise, [facility] as Pharmacist (Family Medicine)  Advanced Directives 08/10/2020 10/05/2019 08/04/2019 03/02/2019 09/15/2018 01/08/2018 09/09/2017  Does Patient Have a Medical Advance Directive? _0  No Yes  Type of Advance Directive Living will Living will Living will [place];Living will Living will;Healthcare Power of [place]  Does patient want to make changes to medical advance directive? No - Patient declined No - Patient declined No - Patient declined No - Patient declined No - Patient declined - No - Patient  declined  Copy of [place] in Chart? - - - No - copy requested No - copy requested - No - copy requested  Would patient like information on creating a medical advance directive? - - - - - No - Patient declined No - Patient declined    Hospital Utilization Over the Past 12 Months: # of hospitalizations or ER visits: 0 # of surgeries: 0  Review of Systems    Patient reports that her overall health is unchanged compared to last year.  History obtained from chart review and the patient  Patient Reported Readings (BP, Pulse, CBG, Weight, etc) none  Pain Assessment Pain : No/denies pain     Current Medications & Allergies (verified) Allergies as of 08/10/2020       Reactions   Heparin Nausea And Vomiting   Warfarin Other (See Comments)   unknown   Penicillins Rash        Medication List        Accurate as of August 10, 2020 10:43 AM. If you have any questions, ask your nurse or doctor.          apixaban 5 MG Tabs tablet Commonly known as: Eliquis Take 1 tablet (5 mg total) by mouth 2 (two) times daily.   atorvastatin 20 MG tablet Commonly known as: LIPITOR Take 1 tablet (20 mg total) by mouth daily.   blood glucose meter kit and supplies Dispense based on patient and insurance preference. Use up to four times daily as directed. (FOR ICD-10 E10.9, E11.9).   busPIRone 5 MG tablet Commonly known as: BUSPAR Take 1 tablet (5 mg total) by mouth 3 (three) times daily as needed.   lisinopril 40 MG tablet Commonly known as: ZESTRIL  Take 1 tablet (40 mg total) by mouth daily.   Multiple Vitamins tablet Take 1 tablet by mouth. Reported on 08/15/1973   OneTouch Delica Plus [id number] Misc Check twice a day and as needed   OneTouch Ultra test strip Generic drug: glucose blood 1 each by Other route 4 (four) times daily.   OVER THE COUNTER MEDICATION Tumeric for inflammation.   Vitamin D (Ergocalciferol) 1.25 MG (50000 UNIT) Caps capsule Commonly  known as: DRISDOL Take 1 capsule by mouth once a week        History (reviewed): Past Medical History:  Diagnosis Date   Anxiety    Hyperlipidemia    Seizures ([place])    Past Surgical History:  Procedure Laterality Date   TUMOR EXCISION     Behind left eye   Family History  Problem Relation Age of Onset   Cancer Mother        lung   Diabetes Brother    Social History   Socioeconomic History   Marital status: Married    Spouse name: Gwyndolyn Saxon   Number of children: 3   Years of education: 12   Highest education level: 12th grade  Occupational History   Not on file  Tobacco Use   Smoking status: Never   Smokeless tobacco: Never  Vaping Use   Vaping Use: Never used  Substance and Sexual Activity   Alcohol use: No   Drug use: No   Sexual activity: Not Currently  Other Topics Concern   Not on file  Social History Narrative   Retired, lives with husband [place]. Two daughters living, one son deceased. Enjoys walking.    Social Determinants of Health   Financial Resource Strain: Not on file  Food Insecurity: Not on file  Transportation Needs: Not on file  Physical Activity: Not on file  Stress: Not on file  Social Connections: Not on file    Activities of Daily Living In your present state of health, do you have any difficulty performing the following activities: 08/10/2020  Hearing? N  Vision? N  Difficulty concentrating or making decisions? N  Walking or climbing stairs? N  Dressing or bathing? N  Doing errands, shopping? N  Preparing Food and eating ? N  Using the Toilet? N  In the past six months, have you accidently leaked urine? N  Do you have problems with loss of bowel control? N  Managing your Medications? N  Managing your Finances? N  Housekeeping or managing your Housekeeping? N  Some recent data might be hidden    Patient Education/ Literacy How often do you need to have someone help you when you read instructions, pamphlets, or other  written materials from your doctor or pharmacy?: 1 - Never What is the last grade level you completed in school?: 12th grade  Exercise Current Exercise Habits: Home exercise routine, Type of exercise: walking, Time (Minutes): 30, Frequency (Times/Week): 3, Weekly Exercise (Minutes/Week): 90, Exercise limited by: None identified  Diet Patient reports consuming 3 meals a day and 1 snack(s) a day Patient reports that her primary diet is: Regular Patient reports that she does have regular access to food.   Depression Screen PHQ 2/9 Scores 08/01/2020 06/27/2020 05/26/2020 03/06/2020 12/14/2019 10/18/2019 08/04/2019  PHQ - 2 Score _0 0 0 2 0  PHQ- 9 Score _1 - 7 3 -     Fall Risk Fall Risk  08/10/2020 08/01/2020 03/06/2020 12/14/2019 08/04/2019  Falls in the past year? 0 0 0  0 0  Number falls in past yr: - - - - -  Injury with Fall? - - - - -  Risk for fall due to : - - - - -     Objective:  Christina Gonzales seemed alert and oriented and she participated appropriately during our telephone visit.  Blood Pressure Weight BMI  BP Readings from Last 3 Encounters:  08/01/20 (!) 160/71  06/27/20 (!) 153/77  03/06/20 (!) 160/63   Wt Readings from Last 3 Encounters:  08/01/20 178 lb 12.8 oz (81.1 kg)  06/27/20 182 lb 3.2 oz (82.6 kg)  03/06/20 174 lb 12.8 oz (79.3 kg)   BMI Readings from Last 1 Encounters:  08/01/20 32.70 kg/m    *Unable to obtain current vital signs, weight, and BMI due to telephone visit type  Hearing/Vision  Destany did not seem to have difficulty with hearing/understanding during the telephone conversation Reports that she has not had a formal eye exam by an eye care professional within the past year Reports that she has not had a formal hearing evaluation within the past year *Unable to fully assess hearing and vision during telephone visit type  Cognitive Function: 6CIT Screen 08/10/2020 08/04/2019  What Year? 0 points 0 points  What month? - 0 points  What  time? 0 points 0 points  Count back from 20 0 points 4 points  Months in reverse 0 points 4 points  Repeat phrase 0 points 0 points  Total Score - 8   (Normal:0-7, Significant for Dysfunction: >8)  Normal Cognitive Function Screening: Yes   Immunization & Health Maintenance Record Immunization History  Administered Date(s) Administered   Fluad Quad(high Dose 65+) 11/19/2018, 12/14/2019   Influenza, High Dose Seasonal PF 11/10/2013   Influenza,inj,Quad PF,6+ Mos 11/14/2014   Influenza-Unspecified 04/05/2009   Moderna Sars-Covid-2 Vaccination 04/09/2019, 05/10/2019, 12/28/2019   Pneumococcal Conjugate-13 11/14/2014   Pneumococcal Polysaccharide-23 04/05/2009, 11/18/2017   Zoster Recombinat (Shingrix) 11/19/2018, 01/16/2019   Zoster, Live 12/19/2010    Health Maintenance  Topic Date Due   COVID-19 Vaccine (4 - Booster for Moderna series) 08/17/2020 (Originally 03/29/2020)   DEXA SCAN  06/27/2021 (Originally 02/18/2010)   INFLUENZA VACCINE  09/04/2020   TETANUS/TDAP  10/19/2021   Fecal DNA (Cologuard)  07/11/2023   Hepatitis C Screening  Completed   PNA vac Low Risk Adult  Completed   Zoster Vaccines- Shingrix  Completed   HPV VACCINES  Aged Out       Assessment  This is a routine wellness examination for Lavonn J Zhao.  Health Maintenance: Due or Overdue Tdap vaccine  Christina Gonzales does not need a referral for Community Assistance: Care Management:   no Social Work:    no Prescription Assistance:  no Nutrition/Diabetes Education:  no   Plan:  Personalized Goals  Goals Addressed             This Visit's Progress    Patient Stated       08/10/2020 AWV Goal: Fall Prevention  Over the next year, patient will decrease their risk for falls by: Using assistive devices, such as a cane or walker, as needed Identifying fall risks within their home and correcting them by: Removing throw rugs Adding handrails to stairs or ramps Removing clutter and  keeping a clear pathway throughout the home Increasing light, especially at night Adding shower handles/bars Raising toilet seat Identifying potential personal risk factors for falls: Medication side effects Incontinence/urgency Vestibular dysfunction Hearing loss Musculoskeletal disorders Neurological disorders Orthostatic  hypotension          Personalized Health Maintenance & Screening Recommendations  Td vaccine  Lung Cancer Screening Recommended: no (Low Dose CT Chest recommended if Age 65-80 years, 30 pack-year currently smoking OR have quit w/in past 15 years) Hepatitis C Screening recommended: no HIV Screening recommended: no  Advanced Directives: Written information was not prepared per patient's request.  Referrals & Orders No orders of the defined types were placed in this encounter.   Follow-up Plan Follow-up with Sharion Balloon, FNP as planned    I have personally reviewed and noted the following in the patient's chart:   Medical and social history Use of alcohol, tobacco or illicit drugs  Current medications and supplements Functional ability and status Nutritional status Physical activity Advanced directives List of other physicians Hospitalizations, surgeries, and ER visits in previous 12 months Vitals Screenings to include cognitive, depression, and falls Referrals and appointments  In addition, I have reviewed and discussed with Christina Gonzales certain preventive protocols, quality metrics, and best practice recommendations. A written personalized care plan for preventive services as well as general preventive health recommendations is available and can be mailed to the patient at her request.      Felicity Coyer, LPN    05/06/7060

## 2020-09-05 ENCOUNTER — Ambulatory Visit (INDEPENDENT_AMBULATORY_CARE_PROVIDER_SITE_OTHER): Payer: Medicare HMO | Admitting: Licensed Clinical Social Worker

## 2020-09-05 DIAGNOSIS — E785 Hyperlipidemia, unspecified: Secondary | ICD-10-CM | POA: Diagnosis not present

## 2020-09-05 DIAGNOSIS — Z86718 Personal history of other venous thrombosis and embolism: Secondary | ICD-10-CM

## 2020-09-05 DIAGNOSIS — F320 Major depressive disorder, single episode, mild: Secondary | ICD-10-CM | POA: Diagnosis not present

## 2020-09-05 DIAGNOSIS — R69 Illness, unspecified: Secondary | ICD-10-CM | POA: Diagnosis not present

## 2020-09-05 DIAGNOSIS — F411 Generalized anxiety disorder: Secondary | ICD-10-CM

## 2020-09-05 DIAGNOSIS — F4321 Adjustment disorder with depressed mood: Secondary | ICD-10-CM

## 2020-09-05 DIAGNOSIS — I10 Essential (primary) hypertension: Secondary | ICD-10-CM

## 2020-09-05 NOTE — Chronic Care Management (AMB) (Signed)
Chronic Care Management    Clinical Social Work Note  09/05/2020 Name: Christina Gonzales MRN: [id number] DOB: 02-06-1945  Christina Gonzales is a 75 y.o. year old female who is a primary care patient of Sharion Balloon, FNP. The CCM team was consulted to assist the patient with chronic disease management and/or care coordination needs related to: [name] .   Engaged with patient by telephone for follow up visit in response to provider referral for social work chronic care management and care coordination services.   Consent to Services:  The patient was given information about Chronic Care Management services, agreed to services, and gave verbal consent prior to initiation of services.  Please see initial visit note for detailed documentation.   Patient agreed to services and consent obtained.   Assessment: Review of patient past medical history, allergies, medications, and health status, including review of relevant consultants reports was performed today as part of a comprehensive evaluation and provision of chronic care management and care coordination services.     SDOH (Social Determinants of Health) assessments and interventions performed:  SDOH Interventions    Flowsheet Row Most Recent Value  SDOH Interventions   Depression Interventions/Treatment  Currently on Treatment        Advanced Directives Status: See [employer]yn[employer] application for related entries.  CCM Care Plan  Allergies  Allergen Reactions   Heparin Nausea And Vomiting   Warfarin Other (See Comments)    unknown   Penicillins Rash    Outpatient Encounter Medications as of 09/05/2020  Medication Sig Note   apixaban (ELIQUIS) 5 MG TABS tablet Take 1 tablet (5 mg total) by mouth 2 (two) times daily.    atorvastatin (LIPITOR) 20 MG tablet Take 1 tablet (20 mg total) by mouth daily.    blood glucose meter kit and supplies Dispense based on patient and insurance preference. Use up to four times daily as  directed. (FOR ICD-10 E10.9, E11.9).    busPIRone (BUSPAR) 5 MG tablet Take 1 tablet (5 mg total) by mouth 3 (three) times daily as needed.    Lancets (ONETOUCH DELICA PLUS [id number]) MISC Check twice a day and as needed    lisinopril (ZESTRIL) 40 MG tablet Take 1 tablet (40 mg total) by mouth daily.    Multiple Vitamins tablet Take 1 tablet by mouth. Reported on 05/26/2015 04/15/2013: Received from: [place] test strip 1 each by Other route 4 (four) times daily.    OVER THE COUNTER MEDICATION Tumeric for inflammation. (Patient not taking: Reported on 08/10/2020)    Vitamin D, Ergocalciferol, (DRISDOL) 1.25 MG (50000 UNIT) CAPS capsule Take 1 capsule by mouth once a week    No facility-administered encounter medications on file as of 09/05/2020.    Patient Active Problem List   Diagnosis Date Noted   History of deep vein thrombosis (DVT) of lower extremity 06/11/2018   Current mild episode of major depressive disorder without prior episode ([place]) 03/03/2018   Grief 03/03/2018   Seborrheic keratosis 02/26/2017   Obesity (BMI 30-39.9) 05/10/2016   GAD (generalized anxiety disorder) 11/14/2014   Hyperlipidemia 09/01/2013   Essential hypertension, benign 09/01/2013   Seizures ([place]) 09/01/2013    Conditions to be addressed/monitored: monitor depression and grief issues of client   Care Plan : [place]  Updates made by Katha Cabal, LCSW since 09/05/2020 12:00 AM     Problem: Depression Identification (Depression)      Goal: Depressive Symptoms  Identified;Manage Grief issues; Manage Depression issues   Start Date: 04/26/2020  Expected End Date: 09/05/2020  This Visit's Progress: On track  Recent Progress: On track  Priority: Medium  Note:   Current Barriers:  Chronic Mental Health needs related to anxiety, depression (experiencing grief issues) Mental Health Concerns  Suicidal Ideation/Homicidal Ideation: No  Clinical Social Work  Goal(s):  patient will work with SW monthly by telephone or in person to reduce or manage symptoms related to anxiety or grief issues faced patient will attend all scheduled medical appointments as evidenced by patient report and care team review of appointment completion in EMR:    Interventions:  1:1 collaboration with Sharion Balloon, [place] regarding development and update of comprehensive plan of care as evidenced by provider attestation and co-signature Talked with Tana Conch, spouse of client  about client part time job (she had said  previously that she works 3 days weekly and enjoys her part time job) Talked with Gwyndolyn Saxon about upcoming client appointments Talked with Gwyndolyn Saxon about [place] support with CCM program Gwyndolyn Saxon informed LCSW today that client did not need services of social worker any longer. He said she was doing fine, was working her part time job and had no social work needs at this time.  LCSW asked Gwyndolyn Saxon if [place] had any nursing needs at this time. He did not mention any nursing needs of client. At request of Jozalyn Baglio, spouse of client, LCSW will discharge client from CCM services today.  Patient Self Care Activities:  Self administers medications as prescribed Attends all scheduled provider appointments Performs ADL's independently Calls provider office for new concerns or questions  Patient Coping Strengths:  Family Spirituality Hopefulness  Patient Self Care Deficits:  Grief challenges Mobility issues occasionally  Patient Goals:  - spend time or talk with others every day - practice relaxation or meditation daily - keep a calendar with appointment dates  Follow Up Plan: At the request of Mehgan Santmyer, spouse of client, LCSW will discharge client today from [facility] program services      [name].Cordaryl Decelles MSW, LCSW Licensed Clinical Social Worker [facility] Care Management [phone]

## 2020-09-05 NOTE — Patient Instructions (Signed)
Visit Information  PATIENT GOALS:  Goals Addressed             This Visit's Progress    Manage My Emotions; Manage Grief issues; Manage depression issues       Timeframe:  Short-Term Goal Priority:  Medium Progress: On Track Start Date:     04/26/20                         Expected End Date:       09/05/20                Follow Up Date At the request of Tana Conch, spouse of client, LCSW will discharge client today from CCM program services    Manage My Emotions (Patient) ; Manage Grief issues faced    Why is this important?   When you are stressed, down or upset, your body reacts too.  For example, your blood pressure may get higher; you may have a headache or stomachache.  When your emotions get the best of you, your body's ability to fight off cold and flu gets weak.  These steps will help you manage your emotions.      Patient Self Care Activities:  Self administers medications as prescribed Attends all scheduled provider appointments Performs ADL's independently Calls provider office for new concerns or questions  Patient Coping Strengths:  Family Spirituality Hopefulness  Patient Self Care Deficits:  Grief challenges Mobility issues occasionally  Patient Goals:  - spend time or talk with others every day - practice relaxation or meditation daily - keep a calendar with appointment dates  Follow Up Plan: Client discharged today from CCM services at request of Cesilia Amie, spouse of client            [name].Madinah Quarry MSW, LCSW Licensed Clinical Social Worker [facility] Care Management [phone]

## 2020-09-15 DIAGNOSIS — W19XXXA Unspecified fall, initial encounter: Secondary | ICD-10-CM | POA: Diagnosis not present

## 2020-09-15 DIAGNOSIS — Z20822 Contact with and (suspected) exposure to covid-19: Secondary | ICD-10-CM | POA: Diagnosis not present

## 2020-09-15 DIAGNOSIS — G40909 Epilepsy, unspecified, not intractable, without status epilepticus: Secondary | ICD-10-CM | POA: Diagnosis not present

## 2020-09-15 DIAGNOSIS — R569 Unspecified convulsions: Secondary | ICD-10-CM | POA: Diagnosis not present

## 2020-09-15 DIAGNOSIS — R55 Syncope and collapse: Secondary | ICD-10-CM | POA: Diagnosis not present

## 2020-09-15 DIAGNOSIS — D72829 Elevated white blood cell count, unspecified: Secondary | ICD-10-CM | POA: Diagnosis not present

## 2020-09-15 DIAGNOSIS — R509 Fever, unspecified: Secondary | ICD-10-CM | POA: Diagnosis not present

## 2020-09-15 DIAGNOSIS — Y92002 Bathroom of unspecified non-institutional (private) residence single-family (private) house as the place of occurrence of the external cause: Secondary | ICD-10-CM | POA: Diagnosis not present

## 2020-09-15 DIAGNOSIS — R918 Other nonspecific abnormal finding of lung field: Secondary | ICD-10-CM | POA: Diagnosis not present

## 2020-09-16 DIAGNOSIS — R569 Unspecified convulsions: Secondary | ICD-10-CM | POA: Diagnosis not present

## 2020-09-16 DIAGNOSIS — R509 Fever, unspecified: Secondary | ICD-10-CM | POA: Diagnosis not present

## 2020-09-16 DIAGNOSIS — R918 Other nonspecific abnormal finding of lung field: Secondary | ICD-10-CM | POA: Diagnosis not present

## 2020-09-28 DIAGNOSIS — D329 Benign neoplasm of meninges, unspecified: Secondary | ICD-10-CM | POA: Diagnosis not present

## 2020-09-28 DIAGNOSIS — R569 Unspecified convulsions: Secondary | ICD-10-CM | POA: Diagnosis not present

## 2020-10-02 ENCOUNTER — Other Ambulatory Visit (HOSPITAL_COMMUNITY): Payer: Self-pay | Admitting: *Deleted

## 2020-10-03 ENCOUNTER — Other Ambulatory Visit (HOSPITAL_COMMUNITY): Payer: Self-pay | Admitting: *Deleted

## 2020-10-03 ENCOUNTER — Other Ambulatory Visit: Payer: Self-pay | Admitting: *Deleted

## 2020-10-03 DIAGNOSIS — Z86718 Personal history of other venous thrombosis and embolism: Secondary | ICD-10-CM

## 2020-10-03 MED ORDER — APIXABAN 5 MG PO TABS: 5.0000 mg | ORAL_TABLET | Freq: Two times a day (BID) | ORAL | 0 refills | Status: DC

## 2020-10-24 DIAGNOSIS — D329 Benign neoplasm of meninges, unspecified: Secondary | ICD-10-CM | POA: Diagnosis not present

## 2020-10-24 DIAGNOSIS — R569 Unspecified convulsions: Secondary | ICD-10-CM | POA: Diagnosis not present

## 2020-11-12 ENCOUNTER — Other Ambulatory Visit: Payer: Self-pay | Admitting: Family

## 2020-11-12 DIAGNOSIS — F411 Generalized anxiety disorder: Secondary | ICD-10-CM

## 2020-11-13 ENCOUNTER — Telehealth: Payer: Self-pay | Admitting: *Deleted

## 2020-11-13 NOTE — Telephone Encounter (Signed)
Fax from [facility] RF request on Doxepin HCL 10 mg 1 QHS Note on 06/28/20 states PA was denied but pt paid OOP On fax it states pharmacy last fill date was 11/12/20 #90 Last OV 08/01/20. Last RF no longer on current med list. Next OV not sched Please advise

## 2020-11-14 ENCOUNTER — Encounter: Payer: Self-pay | Admitting: Family Medicine

## 2020-11-14 ENCOUNTER — Ambulatory Visit (INDEPENDENT_AMBULATORY_CARE_PROVIDER_SITE_OTHER): Payer: Medicare HMO | Admitting: Family Medicine

## 2020-11-14 DIAGNOSIS — R300 Dysuria: Secondary | ICD-10-CM | POA: Diagnosis not present

## 2020-11-14 LAB — URINALYSIS, ROUTINE W REFLEX MICROSCOPIC
Bilirubin, UA: NEGATIVE
Glucose, UA: NEGATIVE
Nitrite, UA: NEGATIVE
Protein,UA: NEGATIVE
RBC, UA: NEGATIVE
Specific Gravity, UA: >1.03 — ABNORMAL HIGH (ref 1.005–1.030)
Urobilinogen, Ur: 0.2 mg/dL (ref 0.2–1.0)
pH, UA: 5 (ref 5.0–7.5)

## 2020-11-14 LAB — MICROSCOPIC EXAMINATION
RBC, Urine: NONE SEEN /hpf (ref 0–2)
Renal Epithel, UA: NONE SEEN /hpf

## 2020-11-14 NOTE — Telephone Encounter (Signed)
Patient states that she is no longer taking the Doxepin.

## 2020-11-14 NOTE — Telephone Encounter (Signed)
Please call patient and see if she is still taking doxepin. If she is please refill. If not, no further orders.

## 2020-11-14 NOTE — Progress Notes (Signed)
Virtual Visit via telephone Note Due to COVID-19 pandemic this visit was conducted virtually. This visit type was conducted due to national recommendations for restrictions regarding the COVID-19 Pandemic (e.g. social distancing, sheltering in place) in an effort to limit this patient's exposure and mitigate transmission in our community. All issues noted in this document were discussed and addressed.  A physical exam was not performed with this format.   I connected with Christina Gonzales on 11/14/2020 at 0905 by telephone and verified that I am speaking with the correct person using two identifiers. Christina Gonzales is currently located at home and  no one  is currently with them during visit. The provider, Monia Pouch, FNP is located in their office at time of visit.  I discussed the limitations, risks, security and privacy concerns of performing an evaluation and management service by telephone and the availability of in person appointments. I also discussed with the patient that there may be a patient responsible charge related to this service. The patient expressed understanding and agreed to proceed.  Subjective:  Patient ID: Christina Gonzales, female    DOB: 01/08/1946, 75 y.o.   MRN: [id number]  Chief Complaint:  Dysuria   HPI: Christina Gonzales is a 75 y.o. female presenting on 11/14/2020 for Dysuria   Pt reports dysuria and lower abdominal pressure. Onset yesterday. No flank pain, fever, chills, weakness, or confusion.   Dysuria  This is a new problem. The current episode started yesterday. The problem occurs every urination. The problem has been unchanged. The quality of the pain is described as burning. The pain is at a severity of 2/10. The pain is mild. There has been no fever. She is Not sexually active. There is No history of pyelonephritis. Associated symptoms include frequency and urgency. Pertinent negatives include no chills, discharge, flank pain,  hematuria, hesitancy, nausea, possible pregnancy, sweats or vomiting. She has tried increased fluids for the symptoms. The treatment provided mild relief.    Relevant past medical, surgical, family, and social history reviewed and updated as indicated.  Allergies and medications reviewed and updated.   Past Medical History:  Diagnosis Date   Anxiety    Hyperlipidemia    Seizures ([place])     Past Surgical History:  Procedure Laterality Date   TUMOR EXCISION     Behind left eye    Social History   Socioeconomic History   Marital status: Married    Spouse name: Gwyndolyn Saxon   Number of children: 3   Years of education: 12   Highest education level: 12th grade  Occupational History   Not on file  Tobacco Use   Smoking status: Never   Smokeless tobacco: Never  Vaping Use   Vaping Use: Never used  Substance and Sexual Activity   Alcohol use: No   Drug use: No   Sexual activity: Not Currently  Other Topics Concern   Not on file  Social History Narrative   Retired, lives with husband [place]. Two daughters living, one son deceased. Enjoys walking.    Social Determinants of Health   Financial Resource Strain: Not on file  Food Insecurity: Not on file  Transportation Needs: Not on file  Physical Activity: Not on file  Stress: Not on file  Social Connections: Not on file  Intimate Partner Violence: Not on file    Outpatient Encounter Medications as of 11/14/2020  Medication Sig   apixaban (ELIQUIS) 5 MG TABS tablet Take 1 tablet (5 mg total) by  mouth 2 (two) times daily.   atorvastatin (LIPITOR) 20 MG tablet Take 1 tablet (20 mg total) by mouth daily.   blood glucose meter kit and supplies Dispense based on patient and insurance preference. Use up to four times daily as directed. (FOR ICD-10 E10.9, E11.9).   busPIRone (BUSPAR) 5 MG tablet Take 1 tablet by mouth three times daily as needed   Lancets (ONETOUCH DELICA PLUS [id number]) MISC Check twice a day and as needed    lisinopril (ZESTRIL) 40 MG tablet Take 1 tablet (40 mg total) by mouth daily.   Multiple Vitamins tablet Take 1 tablet by mouth. Reported on 05/26/2015   [facility] ULTRA test strip 1 each by Other route 4 (four) times daily.   OVER THE COUNTER MEDICATION Tumeric for inflammation. (Patient not taking: Reported on 08/10/2020)   Vitamin D, Ergocalciferol, (DRISDOL) 1.25 MG (50000 UNIT) CAPS capsule Take 1 capsule by mouth once a week   No facility-administered encounter medications on file as of 11/14/2020.    Allergies  Allergen Reactions   Heparin Nausea And Vomiting   Warfarin Other (See Comments)    unknown   Penicillins Rash    Review of Systems  Constitutional:  Negative for activity change, appetite change, chills, fatigue and fever.  Gastrointestinal:  Positive for abdominal pain (lower abdominal pressure with voiding). Negative for nausea and vomiting.  Genitourinary:  Positive for dysuria, frequency and urgency. Negative for decreased urine volume, difficulty urinating, flank pain, hematuria, hesitancy, vaginal bleeding, vaginal discharge and vaginal pain.  Neurological:  Negative for weakness.  Psychiatric/Behavioral:  Negative for confusion.   All other systems reviewed and are negative.       Observations/Objective: No vital signs or physical exam, this was a telephone or virtual health encounter.  Pt alert and oriented, answers all questions appropriately, and able to speak in full sentences.    Assessment and Plan: Doshia was seen today for dysuria.  Diagnoses and all orders for this visit:  Dysuria Pt to come to office and provide urine sample. Treatment pending results. Symptomatic care discussed in detail. Increase fluid intake and avoid bladder irritants such as caffeine. Report any new, worsening, or persistent symptoms.  -     Urinalysis, Routine w reflex microscopic -     Urine Culture     Follow Up Instructions: Return if symptoms worsen or fail to  improve.    I discussed the assessment and treatment plan with the patient. The patient was provided an opportunity to ask questions and all were answered. The patient agreed with the plan and demonstrated an understanding of the instructions.   The patient was advised to call back or seek an in-person evaluation if the symptoms worsen or if the condition fails to improve as anticipated.  The above assessment and management plan was discussed with the patient. The patient verbalized understanding of and has agreed to the management plan. Patient is aware to call the clinic if they develop any new symptoms or if symptoms persist or worsen. Patient is aware when to return to the clinic for a follow-up visit. Patient educated on when it is appropriate to go to the emergency department.    I provided 12 minutes of non-face-to-face time during this encounter. The call started at 0905. The call ended at 0915. The other time was used for coordination of care.    Monia Pouch, FNP-C [facility] [address] [place], [place] [address] [phone] 11/14/2020

## 2020-11-15 ENCOUNTER — Other Ambulatory Visit (HOSPITAL_COMMUNITY): Payer: Self-pay | Admitting: Hematology

## 2020-11-17 LAB — URINE CULTURE

## 2020-12-03 ENCOUNTER — Other Ambulatory Visit: Payer: Self-pay | Admitting: Family

## 2020-12-03 DIAGNOSIS — G4700 Insomnia, unspecified: Secondary | ICD-10-CM

## 2020-12-21 ENCOUNTER — Ambulatory Visit (INDEPENDENT_AMBULATORY_CARE_PROVIDER_SITE_OTHER): Payer: Medicare HMO | Admitting: Family

## 2020-12-21 ENCOUNTER — Other Ambulatory Visit: Payer: Self-pay

## 2020-12-21 ENCOUNTER — Encounter: Payer: Self-pay | Admitting: Family

## 2020-12-21 VITALS — BP 156/78 | HR 66 | Temp 97.2°F | Ht 62.0 in | Wt 176.6 lb

## 2020-12-21 DIAGNOSIS — R69 Illness, unspecified: Secondary | ICD-10-CM | POA: Diagnosis not present

## 2020-12-21 DIAGNOSIS — M159 Polyosteoarthritis, unspecified: Secondary | ICD-10-CM | POA: Diagnosis not present

## 2020-12-21 DIAGNOSIS — E785 Hyperlipidemia, unspecified: Secondary | ICD-10-CM

## 2020-12-21 DIAGNOSIS — F320 Major depressive disorder, single episode, mild: Secondary | ICD-10-CM | POA: Diagnosis not present

## 2020-12-21 DIAGNOSIS — E669 Obesity, unspecified: Secondary | ICD-10-CM

## 2020-12-21 DIAGNOSIS — Z86718 Personal history of other venous thrombosis and embolism: Secondary | ICD-10-CM | POA: Diagnosis not present

## 2020-12-21 DIAGNOSIS — F411 Generalized anxiety disorder: Secondary | ICD-10-CM

## 2020-12-21 DIAGNOSIS — R569 Unspecified convulsions: Secondary | ICD-10-CM | POA: Diagnosis not present

## 2020-12-21 DIAGNOSIS — I10 Essential (primary) hypertension: Secondary | ICD-10-CM

## 2020-12-21 MED ORDER — DICLOFENAC SODIUM 1 % EX GEL: 2.0000 g | Freq: Four times a day (QID) | CUTANEOUS | 1 refills | Status: DC

## 2020-12-21 MED ORDER — TRAMADOL HCL 50 MG PO TABS: 50.0000 mg | ORAL_TABLET | Freq: Two times a day (BID) | ORAL | 2 refills | Status: AC | PRN

## 2020-12-21 NOTE — Patient Instructions (Signed)
Osteoarthritis Osteoarthritis is a type of arthritis. It refers to joint pain or joint disease. Osteoarthritis affects tissue that covers the ends of bones in joints (cartilage). Cartilage acts as a cushion between the bones and helps them move smoothly. Osteoarthritis occurs when cartilage in the joints gets worn down. Osteoarthritis is sometimes called "wear and tear" arthritis. Osteoarthritis is the most common form of arthritis. It often occurs in older people. It is a condition that gets worse over time. The joints most often affected by this condition are in the fingers, toes, hips, knees, and spine, including the neck and lower back. What are the causes? This condition is caused by the wearing down of cartilage that covers the ends of bones. What increases the risk? The following factors may make you more likely to develop this condition: Being age 50 or older. Obesity. Overuse of joints. Past injury of a joint. Past surgery on a joint. Family history of osteoarthritis. What are the signs or symptoms? The main symptoms of this condition are pain, swelling, and stiffness in the joint. Other symptoms may include: An enlarged joint. More pain and further damage caused by small pieces of bone or cartilage that break off and float inside of the joint. Small deposits of bone (osteophytes) that grow on the edges of the joint. A grating or scraping feeling inside the joint when you move it. Popping or creaking sounds when you move. Difficulty walking or exercising. An inability to grip items, twist your hand(s), or control the movements of your hands and fingers. How is this diagnosed? This condition may be diagnosed based on: Your medical history. A physical exam. Your symptoms. X-rays of the affected joint(s). Blood tests to rule out other types of arthritis. How is this treated? There is no cure for this condition, but treatment can help control pain and improve joint function.  Treatment may include a combination of therapies, such as: Pain relief techniques, such as: Applying heat and cold to the joint. Massage. A form of talk therapy called cognitive behavioral therapy (CBT). This therapy helps you set goals and follow up on the changes that you make. Medicines for pain and inflammation. The medicines can be taken by mouth or applied to the skin. They include: NSAIDs, such as ibuprofen. Prescription medicines. Strong anti-inflammatory medicines (corticosteroids). Certain nutritional supplements. A prescribed exercise program. You may work with a physical therapist. Assistive devices, such as a brace, wrap, splint, specialized glove, or cane. A weight control plan. Surgery, such as: An osteotomy. This is done to reposition the bones and relieve pain or to remove loose pieces of bone and cartilage. Joint replacement surgery. You may need this surgery if you have advanced osteoarthritis. Follow these instructions at home: Activity Rest your affected joints as told by your health care provider. Exercise as told by your health care provider. He or she may recommend specific types of exercise, such as: Strengthening exercises. These are done to strengthen the muscles that support joints affected by arthritis. Aerobic activities. These are exercises, such as brisk walking or water aerobics, that increase your heart rate. Range-of-motion activities. These help your joints move more easily. Balance and agility exercises. Managing pain, stiffness, and swelling   If directed, apply heat to the affected area as often as told by your health care provider. Use the heat source that your health care provider recommends, such as a moist heat pack or a heating pad. If you have a removable assistive device, remove it as told by   your health care provider. Place a towel between your skin and the heat source. If your health care provider tells you to keep the assistive device on  while you apply heat, place a towel between the assistive device and the heat source. Leave the heat on for 20-30 minutes. Remove the heat if your skin turns bright red. This is especially important if you are unable to feel pain, heat, or cold. You may have a greater risk of getting burned. If directed, put ice on the affected area. To do this: If you have a removable assistive device, remove it as told by your health care provider. Put ice in a plastic bag. Place a towel between your skin and the bag. If your health care provider tells you to keep the assistive device on during icing, place a towel between the assistive device and the bag. Leave the ice on for 20 minutes, 2-3 times a day. Move your fingers or toes often to reduce stiffness and swelling. Raise (elevate) the injured area above the level of your heart while you are sitting or lying down. General instructions Take over-the-counter and prescription medicines only as told by your health care provider. Maintain a healthy weight. Follow instructions from your health care provider for weight control. Do not use any products that contain nicotine or tobacco, such as cigarettes, e-cigarettes, and chewing tobacco. If you need help quitting, ask your health care provider. Use assistive devices as told by your health care provider. Keep all follow-up visits as told by your health care provider. This is important. Where to find more information National Institute of Arthritis and Musculoskeletal and Skin Diseases: www.niams.nih.gov National Institute on Aging: www.nia.nih.gov American College of Rheumatology: www.rheumatology.org Contact a health care provider if: You have redness, swelling, or a feeling of warmth in a joint that gets worse. You have a fever along with joint or muscle aches. You develop a rash. You have trouble doing your normal activities. Get help right away if: You have pain that gets worse and is not relieved by  pain medicine. Summary Osteoarthritis is a type of arthritis that affects tissue covering the ends of bones in joints (cartilage). This condition is caused by the wearing down of cartilage that covers the ends of bones. The main symptom of this condition is pain, swelling, and stiffness in the joint. There is no cure for this condition, but treatment can help control pain and improve joint function. This information is not intended to replace advice given to you by your health care provider. Make sure you discuss any questions you have with your health care provider. Document Revised: 01/18/2019 Document Reviewed: 01/18/2019 Elsevier Patient Education  2022 [employer].  

## 2020-12-21 NOTE — Progress Notes (Signed)
Subjective:    Patient ID: Christina Gonzales, female    DOB: April 02, 1945, 75 y.o.   MRN: [id number]  Chief Complaint  Patient presents with   Joint Pain    Times 3 mths    Pt presents to the office today with chronic follow up and joint pain. She is followed by Neurologists for seizures.  She takes Eliquis 5 mg for hx of DVT .  Arthritis Presents for follow-up visit. She complains of stiffness, joint swelling and joint warmth. The symptoms have been stable. Affected locations include the left MCP, right MCP, left knee, right knee, left shoulder and right shoulder. Her pain is at a severity of 7/10.  Hyperlipidemia This is a chronic problem. The current episode started more than 1 year ago. The problem is controlled. Exacerbating diseases include obesity. Pertinent negatives include no shortness of breath. Current antihyperlipidemic treatment includes statins. The current treatment provides moderate improvement of lipids. Risk factors for coronary artery disease include dyslipidemia, hypertension, a sedentary lifestyle and post-menopausal.  Anxiety Presents for follow-up visit. Symptoms include depressed mood, excessive worry, irritability, nervous/anxious behavior and restlessness. Patient reports no shortness of breath. Symptoms occur occasionally. The severity of symptoms is moderate. The quality of sleep is good.    Hypertension This is a chronic problem. The current episode started more than 1 year ago. The problem has been waxing and waning since onset. The problem is uncontrolled. Associated symptoms include anxiety and malaise/fatigue. Pertinent negatives include no peripheral edema or shortness of breath. Risk factors for coronary artery disease include dyslipidemia, obesity and sedentary lifestyle. Past treatments include ACE inhibitors. The current treatment provides moderate improvement.     Review of Systems  Constitutional:  Positive for irritability and malaise/fatigue.   Respiratory:  Negative for shortness of breath.   Musculoskeletal:  Positive for arthritis, joint swelling and stiffness.  Psychiatric/Behavioral:  The patient is nervous/anxious.   All other systems reviewed and are negative.     Objective:   Physical Exam Vitals reviewed.  Constitutional:      General: She is not in acute distress.    Appearance: She is well-developed. She is obese.  HENT:     Head: Normocephalic and atraumatic.     Right Ear: Tympanic membrane normal.     Left Ear: Tympanic membrane normal.  Eyes:     Pupils: Pupils are equal, round, and reactive to light.  Neck:     Thyroid: No thyromegaly.  Cardiovascular:     Rate and Rhythm: Normal rate and regular rhythm.     Heart sounds: Normal heart sounds. No murmur heard. Pulmonary:     Effort: Pulmonary effort is normal. No respiratory distress.     Breath sounds: Normal breath sounds. No wheezing.  Abdominal:     General: Bowel sounds are normal. There is no distension.     Palpations: Abdomen is soft.     Tenderness: There is no abdominal tenderness.  Musculoskeletal:        General: No tenderness. Normal range of motion.     Cervical back: Normal range of motion and neck supple.  Skin:    General: Skin is warm and dry.  Neurological:     Mental Status: She is alert and oriented to person, place, and time.     Cranial Nerves: No cranial nerve deficit.     Deep Tendon Reflexes: Reflexes are normal and symmetric.  Psychiatric:        Behavior: Behavior normal.  Thought Content: Thought content normal.        Judgment: Judgment normal.      BP (!) 187/68   Pulse 70   Temp (!) 97.2 F (36.2 C) (Temporal)   Ht 5' 2" (1.575 m)   Wt 176 lb 9.6 oz (80.1 kg)   BMI 32.30 kg/m      Assessment & Plan:  /Christina Gonzales comes in today with chief complaint of Joint Pain (Times 3 mths )   Diagnosis and orders addressed:  1. Essential hypertension, benign - CMP14+EGFR - CBC with  Differential/Platelet  2. Obesity (BMI 30-39.9) - CMP14+EGFR - CBC with Differential/Platelet  3. Current mild episode of major depressive disorder without prior episode (HCC) - CMP14+EGFR - CBC with Differential/Platelet  4. History of deep vein thrombosis (DVT) of lower extremity - CMP14+EGFR - CBC with Differential/Platelet  5. Hyperlipidemia, unspecified hyperlipidemia type - CMP14+EGFR - CBC with Differential/Platelet  6. GAD (generalized anxiety disorder) - CMP14+EGFR - CBC with Differential/Platelet  7. Seizures (HCC) - CMP14+EGFR - CBC with Differential/Platelet  8. Osteoarthritis of multiple joints, unspecified osteoarthritis type Start Ultram as needed Voltaren as needed ROM exercises encouraged  - diclofenac Sodium (VOLTAREN) 1 % GEL; Apply 2 g topically 4 (four) times daily.  Dispense: 350 g; Refill: 1 - traMADol (ULTRAM) 50 MG tablet; Take 1 tablet (50 mg total) by mouth every 12 (twelve) hours as needed for up to 5 days.  Dispense: 60 tablet; Refill: 2 - CMP14+EGFR - CBC with Differential/Platelet   Labs pending Health Maintenance reviewed Diet and exercise encouraged  Follow up plan: 3 months    Christina Hawks, FNP   

## 2020-12-22 ENCOUNTER — Telehealth: Payer: Self-pay | Admitting: Family Medicine

## 2020-12-22 LAB — CMP14+EGFR
ALT: 44 IU/L — ABNORMAL HIGH (ref 0–32)
AST: 32 IU/L (ref 0–40)
Albumin/Globulin Ratio: 1.4 (ref 1.2–2.2)
Albumin: 4.2 g/dL (ref 3.7–4.7)
Alkaline Phosphatase: 39 IU/L — ABNORMAL LOW (ref 44–121)
BUN/Creatinine Ratio: 25 (ref 12–28)
BUN: 15 mg/dL (ref 8–27)
Bilirubin Total: 0.3 mg/dL (ref 0.0–1.2)
CO2: 27 mmol/L (ref 20–29)
Calcium: 9.9 mg/dL (ref 8.7–10.3)
Chloride: 105 mmol/L (ref 96–106)
Creatinine, Ser: 0.61 mg/dL (ref 0.57–1.00)
Globulin, Total: 2.9 g/dL (ref 1.5–4.5)
Glucose: 89 mg/dL (ref 70–99)
Potassium: 5.4 mmol/L — ABNORMAL HIGH (ref 3.5–5.2)
Sodium: 143 mmol/L (ref 134–144)
Total Protein: 7.1 g/dL (ref 6.0–8.5)
eGFR: 93 mL/min/{1.73_m2} (ref >59)

## 2020-12-22 LAB — CBC WITH DIFFERENTIAL/PLATELET
Basophils Absolute: 0.1 10*3/uL (ref 0.0–0.2)
Basos: 1 %
EOS (ABSOLUTE): 0.2 10*3/uL (ref 0.0–0.4)
Eos: 2 %
Hematocrit: 41.7 % (ref 34.0–46.6)
Hemoglobin: 14.1 g/dL (ref 11.1–15.9)
Immature Grans (Abs): 0 10*3/uL (ref 0.0–0.1)
Immature Granulocytes: 0 %
Lymphocytes Absolute: 3.2 10*3/uL — ABNORMAL HIGH (ref 0.7–3.1)
Lymphs: 42 %
MCH: 29.7 pg (ref 26.6–33.0)
MCHC: 33.8 g/dL (ref 31.5–35.7)
MCV: 88 fL (ref 79–97)
Monocytes Absolute: 0.6 10*3/uL (ref 0.1–0.9)
Monocytes: 8 %
Neutrophils Absolute: 3.6 10*3/uL (ref 1.4–7.0)
Neutrophils: 47 %
Platelets: 204 10*3/uL (ref 150–450)
RBC: 4.74 x10E6/uL (ref 3.77–5.28)
RDW: 12.8 % (ref 11.7–15.4)
WBC: 7.6 10*3/uL (ref 3.4–10.8)

## 2020-12-22 NOTE — Telephone Encounter (Signed)
This request has been approved.  Please note any additional information provided by [employer] at the bottom of your screen.   Pharmacy aware.

## 2020-12-25 ENCOUNTER — Other Ambulatory Visit: Payer: Self-pay | Admitting: Family

## 2020-12-25 DIAGNOSIS — E875 Hyperkalemia: Secondary | ICD-10-CM

## 2020-12-27 ENCOUNTER — Telehealth: Payer: Self-pay | Admitting: *Deleted

## 2020-12-27 NOTE — Telephone Encounter (Signed)
Doxepin HCl 10MG  capsules PA started   Key: [id number] Sent to plan

## 2020-12-27 NOTE — Telephone Encounter (Signed)
Approvedtoday Your request has been approved [facility] aware

## 2021-01-01 ENCOUNTER — Other Ambulatory Visit: Payer: Medicare HMO

## 2021-01-01 DIAGNOSIS — E875 Hyperkalemia: Secondary | ICD-10-CM

## 2021-01-02 LAB — BMP8+EGFR
BUN/Creatinine Ratio: 21 (ref 12–28)
BUN: 18 mg/dL (ref 8–27)
CO2: 23 mmol/L (ref 20–29)
Calcium: 9.4 mg/dL (ref 8.7–10.3)
Chloride: 105 mmol/L (ref 96–106)
Creatinine, Ser: 0.84 mg/dL (ref 0.57–1.00)
Glucose: 151 mg/dL — ABNORMAL HIGH (ref 70–99)
Potassium: 4.7 mmol/L (ref 3.5–5.2)
Sodium: 142 mmol/L (ref 134–144)
eGFR: 72 mL/min/{1.73_m2} (ref >59)

## 2021-01-03 ENCOUNTER — Telehealth: Payer: Self-pay | Admitting: Family Medicine

## 2021-01-21 IMAGING — CT CT CHEST W/ CM
2 of 3 series · 15 of 36 positions shown, 18 images · IV contrast (Omnipaque or Isovue)
Comparison: 01/06/2018

CLINICAL DATA: Pulmonary nodules.

EXAM:
CT CHEST WITH CONTRAST
TECHNIQUE: Multidetector CT imaging of the chest was performed during
intravenous contrast administration.
CONTRAST:  75mL OMNIPAQUE IOHEXOL 300 MG/ML  SOLN

[Series 2: routine chest with · axial · 0.60mm/px · z∈[+1259,+1503]mm · 12 of 144 slices shown, 15 images]
[im 11/144  mediastinal]
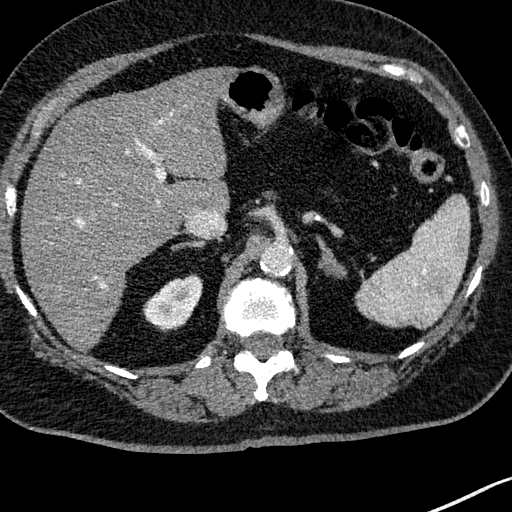
[im 11/144  lung]
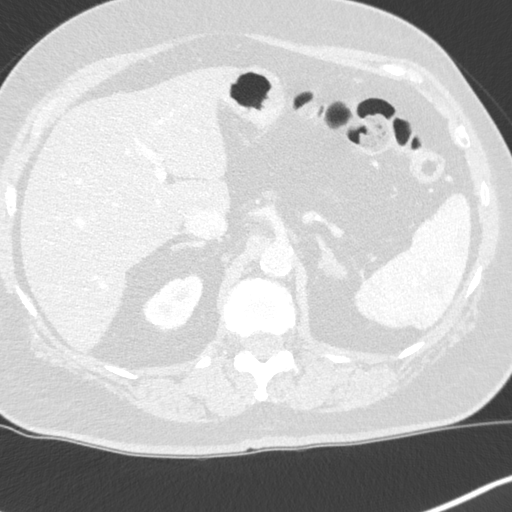
[im 22/144  lung]
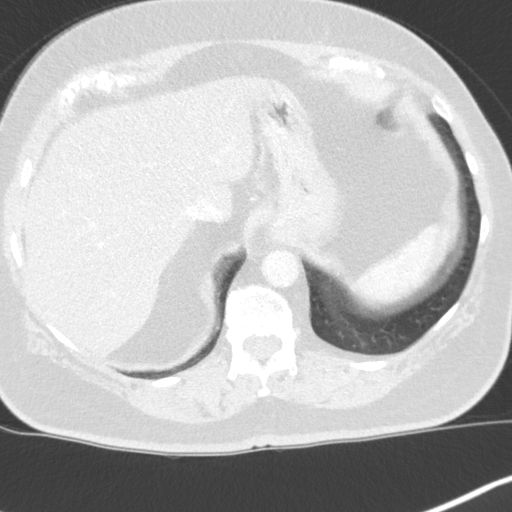
[im 32/144  lung]
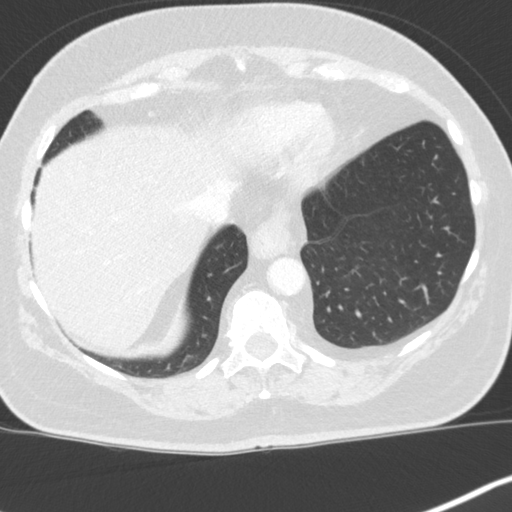
[im 43/144  lung]
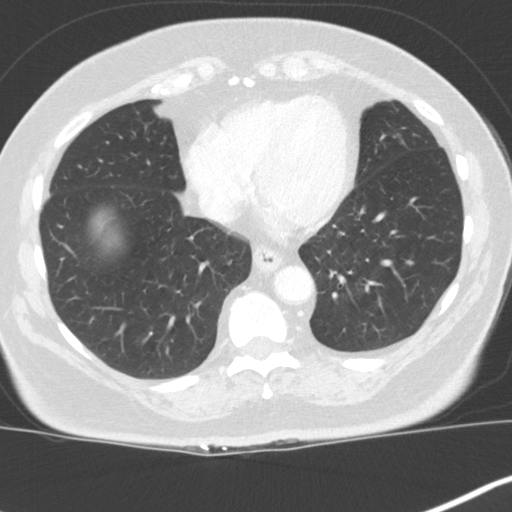
[im 53/144  mediastinal]
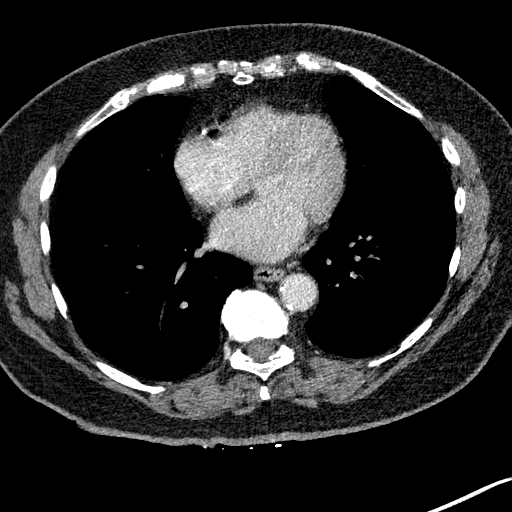
[im 53/144  lung]
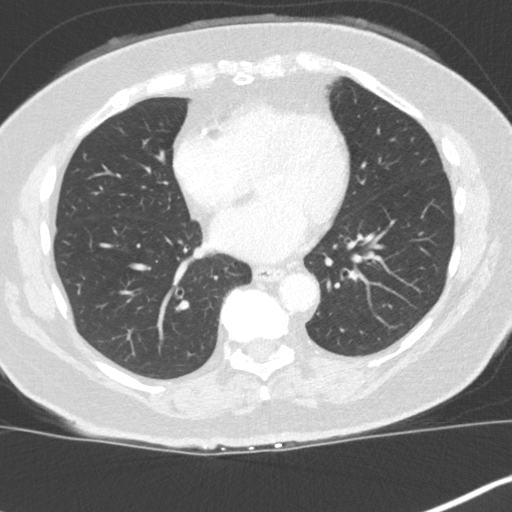
[im 64/144  lung]
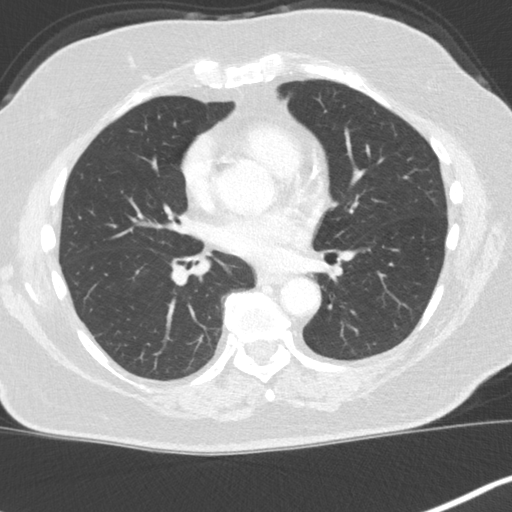
[im 80/144  lung]
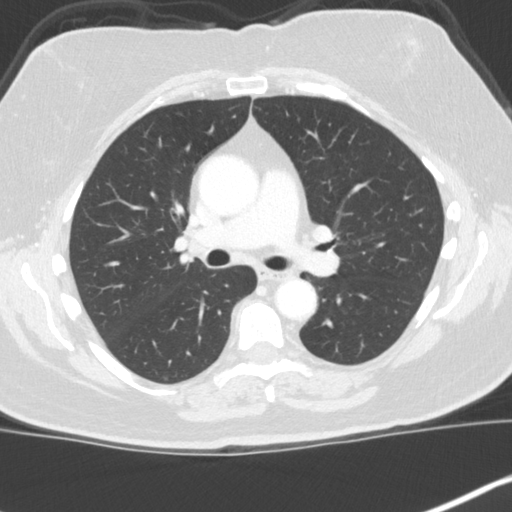
[im 91/144  lung]
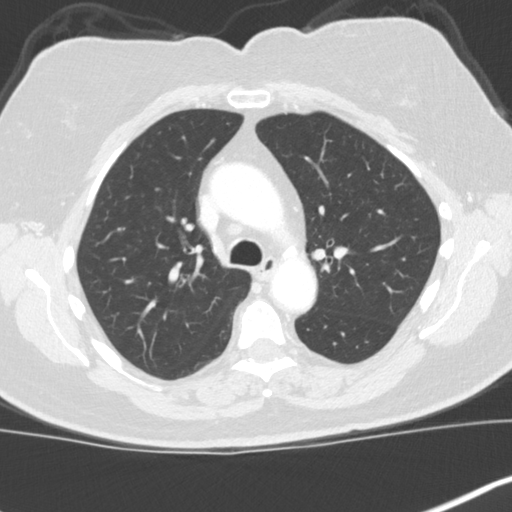
[im 101/144  mediastinal]
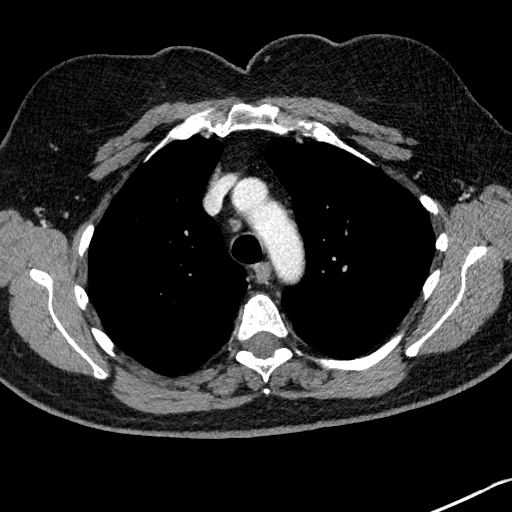
[im 101/144  lung]
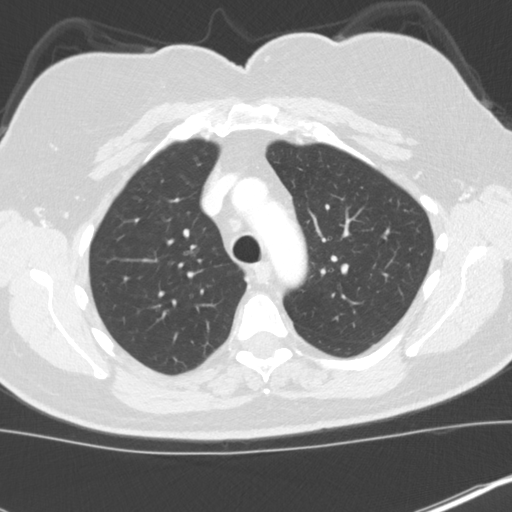
[im 112/144  lung]
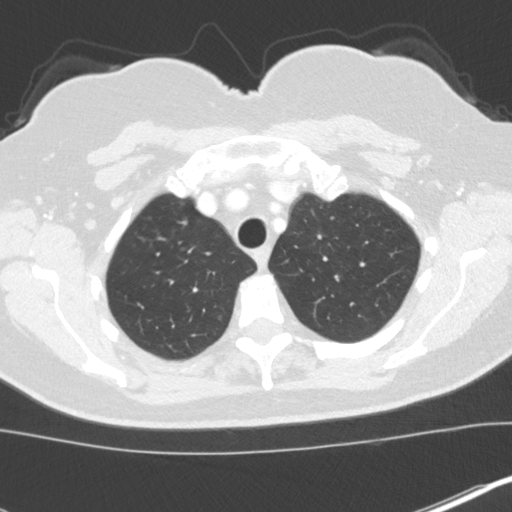
[im 122/144  lung]
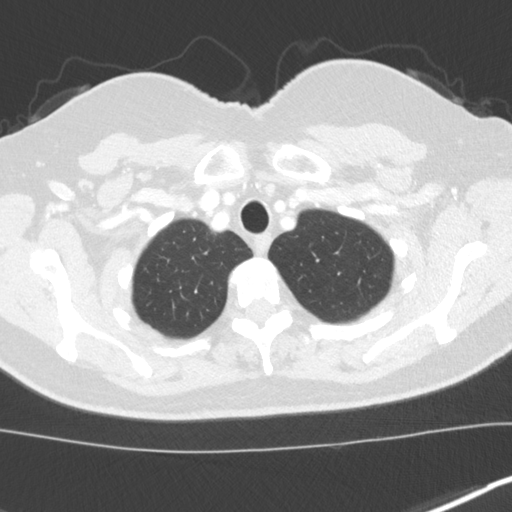
[im 133/144  lung]
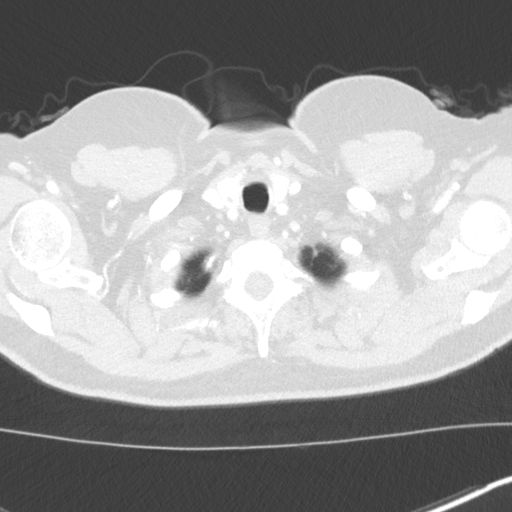

[Series 5: coronal · coronal · 0.58mm/px · 3 of 134 slices shown]
[im 27/134  lung]
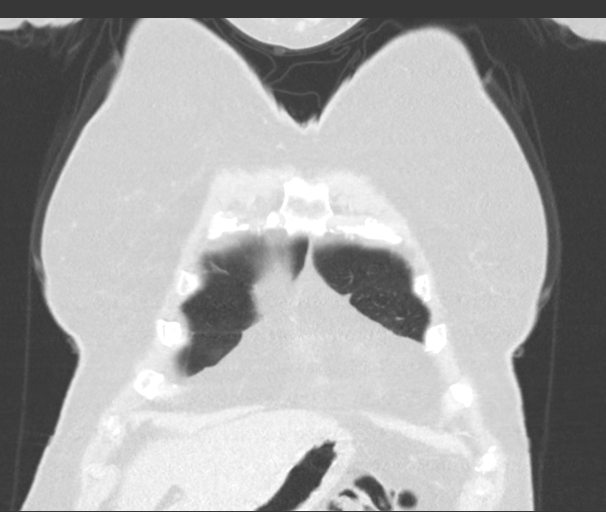
[im 54/134  lung]
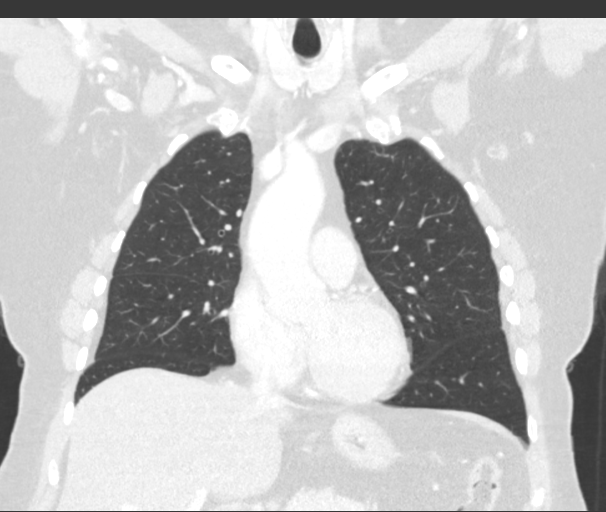
[im 80/134  lung]
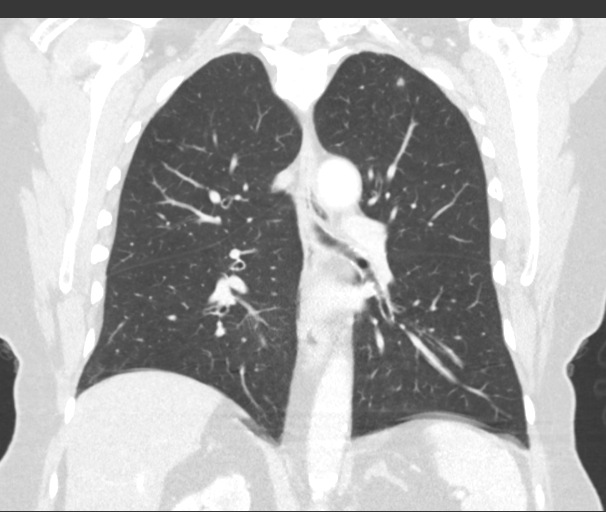

[15 of 36 positions shown; findings below may reference images not displayed]

FINDINGS: Cardiovascular: The heart size is normal. No substantial pericardial
effusion. Coronary artery calcification is evident. Atherosclerotic
calcification is noted in the wall of the thoracic aorta.

Mediastinum/Nodes: Stable tiny right thyroid nodule. No mediastinal
lymphadenopathy. There is no hilar lymphadenopathy. The esophagus
has normal imaging features. Tiny hiatal hernia noted. Stomach is
unremarkable. No gastric wall thickening. There is no axillary
lymphadenopathy.

Lungs/Pleura: 3 mm right upper lobe nodule (image 75/series 4) is
new in the interval. 6 mm left apical nodule ([DATE]) is stable. No
focal airspace consolidation. No pleural effusion.

Upper Abdomen: Unremarkable. There is abdominal aortic
atherosclerosis without visible aneurysm in the upper abdomen.

Musculoskeletal: No worrisome lytic or sclerotic osseous
abnormality.
IMPRESSION: 1. Interval development of a new 3 mm right upper lobe pulmonary
nodule. No follow-up needed if patient is low-risk. Non-contrast
chest CT can be considered in 12 months if patient is high-risk.
This recommendation follows the consensus statement: Guidelines for
Management of Incidental Pulmonary Nodules Detected on CT Images:
2. 6 mm left apical nodule remains stable, previously characterized
as benign.
3. Tiny hiatal hernia.
4.  Aortic Atherosclerois ([id number]-170.0)

## 2021-02-14 NOTE — Telephone Encounter (Signed)
Will close encounter

## 2021-02-17 ENCOUNTER — Other Ambulatory Visit: Payer: Self-pay | Admitting: Family

## 2021-02-17 DIAGNOSIS — E785 Hyperlipidemia, unspecified: Secondary | ICD-10-CM

## 2021-03-16 ENCOUNTER — Other Ambulatory Visit: Payer: Self-pay | Admitting: Family

## 2021-03-16 DIAGNOSIS — I10 Essential (primary) hypertension: Secondary | ICD-10-CM

## 2021-04-18 DIAGNOSIS — G40109 Localization-related (focal) (partial) symptomatic epilepsy and epileptic syndromes with simple partial seizures, not intractable, without status epilepticus: Secondary | ICD-10-CM | POA: Diagnosis not present

## 2021-04-18 DIAGNOSIS — G4710 Hypersomnia, unspecified: Secondary | ICD-10-CM | POA: Diagnosis not present

## 2021-04-18 DIAGNOSIS — D329 Benign neoplasm of meninges, unspecified: Secondary | ICD-10-CM | POA: Diagnosis not present

## 2021-04-18 DIAGNOSIS — G4700 Insomnia, unspecified: Secondary | ICD-10-CM | POA: Diagnosis not present

## 2021-04-18 DIAGNOSIS — Z79899 Other long term (current) drug therapy: Secondary | ICD-10-CM | POA: Diagnosis not present

## 2021-04-18 DIAGNOSIS — G4719 Other hypersomnia: Secondary | ICD-10-CM | POA: Diagnosis not present

## 2021-04-18 DIAGNOSIS — G40909 Epilepsy, unspecified, not intractable, without status epilepticus: Secondary | ICD-10-CM | POA: Diagnosis not present

## 2021-04-19 ENCOUNTER — Other Ambulatory Visit: Payer: Self-pay | Admitting: *Deleted

## 2021-04-19 NOTE — Telephone Encounter (Signed)
Hawks. NTBS 3 mos ckup was to be in Feb. 30 days given 03/16/21 ?

## 2021-04-20 NOTE — Telephone Encounter (Signed)
Apt scheduled 04/30/2021 11:10 ?

## 2021-04-30 ENCOUNTER — Ambulatory Visit: Payer: Medicare HMO | Admitting: Family

## 2021-05-03 ENCOUNTER — Ambulatory Visit (INDEPENDENT_AMBULATORY_CARE_PROVIDER_SITE_OTHER): Payer: Medicare HMO | Admitting: Family

## 2021-05-03 ENCOUNTER — Encounter: Payer: Self-pay | Admitting: Family

## 2021-05-03 VITALS — BP 179/84 | HR 84 | Temp 98.0°F | Ht 62.0 in | Wt 179.0 lb

## 2021-05-03 DIAGNOSIS — F411 Generalized anxiety disorder: Secondary | ICD-10-CM

## 2021-05-03 DIAGNOSIS — I10 Essential (primary) hypertension: Secondary | ICD-10-CM | POA: Diagnosis not present

## 2021-05-03 DIAGNOSIS — E669 Obesity, unspecified: Secondary | ICD-10-CM

## 2021-05-03 DIAGNOSIS — F320 Major depressive disorder, single episode, mild: Secondary | ICD-10-CM | POA: Diagnosis not present

## 2021-05-03 DIAGNOSIS — E785 Hyperlipidemia, unspecified: Secondary | ICD-10-CM

## 2021-05-03 DIAGNOSIS — G4700 Insomnia, unspecified: Secondary | ICD-10-CM

## 2021-05-03 DIAGNOSIS — R69 Illness, unspecified: Secondary | ICD-10-CM | POA: Diagnosis not present

## 2021-05-03 MED ORDER — AMLODIPINE BESYLATE 5 MG PO TABS: 5.0000 mg | ORAL_TABLET | Freq: Every day | ORAL | 1 refills | Status: DC

## 2021-05-03 MED ORDER — TRAZODONE HCL 100 MG PO TABS: 50.0000 mg | ORAL_TABLET | Freq: Every day | ORAL | 1 refills | Status: DC

## 2021-05-03 NOTE — Progress Notes (Signed)
? ?Subjective:  ? ? Patient ID: Christina Gonzales, female    DOB: [birth year], 76 y.o.   MRN: [id number] ? ?Chief Complaint  ?Patient presents with  ? Medical Management of Chronic Issues  ?  SLEEPING ISSUES. WANTS TO DISCUSS LOSSING WT.  ? ?Pt presents to the office today with chronic follow up and joint pain. She is followed by Neurologists for seizures.   ? ?She takes Eliquis 5 mg for hx of DVT.  ?Hypertension ?This is a chronic problem. The current episode started more than 1 year ago. The problem has been waxing and waning since onset. The problem is uncontrolled. Associated symptoms include anxiety and malaise/fatigue. Pertinent negatives include no peripheral edema or shortness of breath. Risk factors for coronary artery disease include dyslipidemia, obesity and sedentary lifestyle. The current treatment provides moderate improvement.  ?Hyperlipidemia ?This is a chronic problem. The current episode started more than 1 year ago. The problem is controlled. Exacerbating diseases include obesity. Pertinent negatives include no shortness of breath. Current antihyperlipidemic treatment includes statins. The current treatment provides moderate improvement of lipids. Risk factors for coronary artery disease include dyslipidemia, hypertension, a sedentary lifestyle and post-menopausal.  ?Anxiety ?Presents for follow-up visit. Symptoms include excessive worry, insomnia, irritability and nervous/anxious behavior. Patient reports no restlessness or shortness of breath. Symptoms occur rarely.  ? ? ?Depression ?       This is a chronic problem.  The current episode started more than 1 year ago.   The onset quality is gradual.   Associated symptoms include fatigue and insomnia.  Associated symptoms include no helplessness, no hopelessness, not irritable, no restlessness and not sad.  Past treatments include nothing.  Past medical history includes anxiety.   ?Insomnia ?Primary symptoms: difficulty falling asleep, frequent  awakening, malaise/fatigue.   ?The current episode started more than one year. The onset quality is gradual. The problem occurs intermittently. The treatment provided moderate relief. PMH includes: depression.   ? ? ? ?Review of Systems  ?Constitutional:  Positive for fatigue, irritability and malaise/fatigue.  ?Respiratory:  Negative for shortness of breath.   ?Psychiatric/Behavioral:  Positive for depression. The patient is nervous/anxious and has insomnia.   ?All other systems reviewed and are negative. ? ?   ?Objective:  ? Physical Exam ?Vitals reviewed.  ?Constitutional:   ?   General: She is not irritable.She is not in acute distress. ?   Appearance: She is well-developed. She is obese.  ?HENT:  ?   Head: Normocephalic and atraumatic.  ?   Right Ear: Tympanic membrane normal.  ?   Left Ear: Tympanic membrane normal.  ?Eyes:  ?   Pupils: Pupils are equal, round, and reactive to light.  ?Neck:  ?   Thyroid: No thyromegaly.  ?Cardiovascular:  ?   Rate and Rhythm: Normal rate and regular rhythm.  ?   Heart sounds: Normal heart sounds. No murmur heard. ?Pulmonary:  ?   Effort: Pulmonary effort is normal. No respiratory distress.  ?   Breath sounds: Normal breath sounds. No wheezing.  ?Abdominal:  ?   General: Bowel sounds are normal. There is no distension.  ?   Palpations: Abdomen is soft.  ?   Tenderness: There is no abdominal tenderness.  ?Musculoskeletal:     ?   General: No tenderness. Normal range of motion.  ?   Cervical back: Normal range of motion and neck supple.  ?Skin: ?   General: Skin is warm and dry.  ?Neurological:  ?  Mental Status: She is alert and oriented to person, place, and time.  ?   Cranial Nerves: No cranial nerve deficit.  ?   Deep Tendon Reflexes: Reflexes are normal and symmetric.  ?Psychiatric:     ?   Behavior: Behavior normal.     ?   Thought Content: Thought content normal.     ?   Judgment: Judgment normal.  ? ? ?BP (!) 179/84   Pulse 84   Temp 98 ?F (36.7 ?C) (Temporal)   Ht  _0  (1.575 m)   Wt 179 lb (81.2 kg)   BMI 32.74 kg/m?  ? ?   ?Assessment & Plan:  ?Christina Gonzales comes in today with chief complaint of Medical Management of Chronic Issues (SLEEPING ISSUES. WANTS TO DISCUSS LOSSING WT.) ? ? ?Diagnosis and orders addressed: ? ?1. Essential hypertension, benign ?Will add Norvasc 5 mg  ?-Daily blood pressure log given with instructions on how to fill out and told to bring to next visit ?-Dash diet information given ?-Exercise encouraged ?- Stress Management  ?-Continue current meds ?-RTO in 2-4 weeks  ?- amLODipine (NORVASC) 5 MG tablet; Take 1 tablet (5 mg total) by mouth daily.  Dispense: 90 tablet; Refill: 1 ?- CMP14+EGFR ? ?2. Current mild episode of major depressive disorder without prior episode ([place]) ?- CMP14+EGFR ? ?3. GAD (generalized anxiety disorder) ?- CMP14+EGFR ? ?4. Hyperlipidemia, unspecified hyperlipidemia type ? ?- CMP14+EGFR ? ?5. Obesity (BMI 30-39.9) ?- CMP14+EGFR ? ?6. Insomnia, unspecified type ?Will add trazodone 100 mg  ?Sleep ritual  ?- traZODone (DESYREL) 100 MG tablet; Take 0.5-1 tablets (50-100 mg total) by mouth at bedtime.  Dispense: 90 tablet; Refill: 1 ?- CMP14+EGFR ? ? ?Labs pending ?Health Maintenance reviewed ?Diet and exercise encouraged ? ?Follow up plan: ?2-4 weeks to recheck HTN and insomnia  ? ? ?[name], FNP ? ? ?

## 2021-05-03 NOTE — Patient Instructions (Signed)

## 2021-05-04 LAB — CMP14+EGFR
ALT: 65 IU/L — ABNORMAL HIGH (ref 0–32)
AST: 42 IU/L — ABNORMAL HIGH (ref 0–40)
Albumin/Globulin Ratio: 1.7 (ref 1.2–2.2)
Albumin: 4.4 g/dL (ref 3.7–4.7)
Alkaline Phosphatase: 39 IU/L — ABNORMAL LOW (ref 44–121)
BUN/Creatinine Ratio: 29 — ABNORMAL HIGH (ref 12–28)
BUN: 18 mg/dL (ref 8–27)
Bilirubin Total: 0.5 mg/dL (ref 0.0–1.2)
CO2: 23 mmol/L (ref 20–29)
Calcium: 9.5 mg/dL (ref 8.7–10.3)
Chloride: 104 mmol/L (ref 96–106)
Creatinine, Ser: 0.63 mg/dL (ref 0.57–1.00)
Globulin, Total: 2.6 g/dL (ref 1.5–4.5)
Glucose: 105 mg/dL — ABNORMAL HIGH (ref 70–99)
Potassium: 5.1 mmol/L (ref 3.5–5.2)
Sodium: 143 mmol/L (ref 134–144)
Total Protein: 7 g/dL (ref 6.0–8.5)
eGFR: 92 mL/min/{1.73_m2} (ref >59)

## 2021-05-06 ENCOUNTER — Other Ambulatory Visit (HOSPITAL_COMMUNITY): Payer: Self-pay | Admitting: Hematology

## 2021-05-06 DIAGNOSIS — Z86718 Personal history of other venous thrombosis and embolism: Secondary | ICD-10-CM

## 2021-05-08 ENCOUNTER — Other Ambulatory Visit (HOSPITAL_COMMUNITY): Payer: Self-pay | Admitting: Hematology

## 2021-05-08 DIAGNOSIS — Z86718 Personal history of other venous thrombosis and embolism: Secondary | ICD-10-CM

## 2021-05-12 ENCOUNTER — Other Ambulatory Visit: Payer: Self-pay | Admitting: Family

## 2021-05-12 DIAGNOSIS — I10 Essential (primary) hypertension: Secondary | ICD-10-CM

## 2021-05-14 ENCOUNTER — Telehealth: Payer: Self-pay | Admitting: Family

## 2021-05-14 NOTE — Telephone Encounter (Signed)
?  Prescription Request ? ?05/14/2021 ? ?Is this a "Controlled Substance" medicine? no ? ?Have you seen your PCP in the last 2 weeks? yes ? ?If YES, route message to pool  -  If NO, patient needs to be scheduled for appointment. ? ?What is the name of the medication or equipment? lisinopril (ZESTRIL) 40 MG tablet ? ?Have you contacted your pharmacy to request a refill? Yes   ? ?Which pharmacy would you like this sent to? [facility] in [place] ? ? ?Patient notified that their request is being sent to the clinical staff for review and that they should receive a response within 2 business days.  ?  ?

## 2021-05-14 NOTE — Telephone Encounter (Signed)
Message left that rx sent to pharmacy. ?

## 2021-05-15 ENCOUNTER — Encounter: Payer: Self-pay | Admitting: Family Medicine

## 2021-05-17 ENCOUNTER — Other Ambulatory Visit: Payer: Self-pay | Admitting: Family

## 2021-05-17 DIAGNOSIS — E785 Hyperlipidemia, unspecified: Secondary | ICD-10-CM

## 2021-05-28 ENCOUNTER — Encounter: Payer: Self-pay | Admitting: Nurse Practitioner

## 2021-05-28 ENCOUNTER — Ambulatory Visit (INDEPENDENT_AMBULATORY_CARE_PROVIDER_SITE_OTHER): Payer: Medicare HMO | Admitting: Nurse Practitioner

## 2021-05-28 VITALS — BP 140/64 | HR 72 | Temp 97.7°F | Resp 20 | Ht 62.0 in | Wt 179.0 lb

## 2021-05-28 DIAGNOSIS — R300 Dysuria: Secondary | ICD-10-CM | POA: Diagnosis not present

## 2021-05-28 LAB — MICROSCOPIC EXAMINATION: Renal Epithel, UA: NONE SEEN /hpf

## 2021-05-28 LAB — URINALYSIS, COMPLETE
Bilirubin, UA: NEGATIVE
Nitrite, UA: POSITIVE — AB
Specific Gravity, UA: 1.02 (ref 1.005–1.030)
Urobilinogen, Ur: 4 mg/dL — ABNORMAL HIGH (ref 0.2–1.0)
pH, UA: 5 (ref 5.0–7.5)

## 2021-05-28 MED ORDER — SULFAMETHOXAZOLE-TRIMETHOPRIM 800-160 MG PO TABS: 1.0000 | ORAL_TABLET | Freq: Two times a day (BID) | ORAL | 0 refills | Status: DC

## 2021-05-28 MED ORDER — PHENAZOPYRIDINE HCL 95 MG PO TABS: 95.0000 mg | ORAL_TABLET | Freq: Three times a day (TID) | ORAL | 0 refills | Status: DC | PRN

## 2021-05-28 NOTE — Patient Instructions (Signed)
Urinary Tract Infection, Adult A urinary tract infection (UTI) is an infection of any part of the urinary tract. The urinary tract includes: The kidneys. The ureters. The bladder. The urethra. These organs make, store, and get rid of pee (urine) in the body. What are the causes? This infection is caused by germs (bacteria) in your genital area. These germs grow and cause swelling (inflammation) of your urinary tract. What increases the risk? The following factors may make you more likely to develop this condition: Using a small, thin tube (catheter) to drain pee. Not being able to control when you pee or poop (incontinence). Being female. If you are female, these things can increase the risk: Using these methods to prevent pregnancy: A medicine that kills sperm (spermicide). A device that blocks sperm (diaphragm). Having low levels of a female hormone (estrogen). Being pregnant. You are more likely to develop this condition if: You have genes that add to your risk. You are sexually active. You take antibiotic medicines. You have trouble peeing because of: A prostate that is bigger than normal, if you are female. A blockage in the part of your body that drains pee from the bladder. A kidney stone. A nerve condition that affects your bladder. Not getting enough to drink. Not peeing often enough. You have other conditions, such as: Diabetes. A weak disease-fighting system (immune system). Sickle cell disease. Gout. Injury of the spine. What are the signs or symptoms? Symptoms of this condition include: Needing to pee right away. Peeing small amounts often. Pain or burning when peeing. Blood in the pee. Pee that smells bad or not like normal. Trouble peeing. Pee that is cloudy. Fluid coming from the vagina, if you are female. Pain in the belly or lower back. Other symptoms include: Vomiting. Not feeling hungry. Feeling mixed up (confused). This may be the first symptom in  older adults. Being tired and grouchy (irritable). A fever. Watery poop (diarrhea). How is this treated? Taking antibiotic medicine. Taking other medicines. Drinking enough water. In some cases, you may need to see a specialist. Follow these instructions at home:  Medicines Take over-the-counter and prescription medicines only as told by your doctor. If you were prescribed an antibiotic medicine, take it as told by your doctor. Do not stop taking it even if you start to feel better. General instructions Make sure you: Pee until your bladder is empty. Do not hold pee for a long time. Empty your bladder after sex. Wipe from front to back after peeing or pooping if you are a female. Use each tissue one time when you wipe. Drink enough fluid to keep your pee pale yellow. Keep all follow-up visits. Contact a doctor if: You do not get better after 1-2 days. Your symptoms go away and then come back. Get help right away if: You have very bad back pain. You have very bad pain in your lower belly. You have a fever. You have chills. You feeling like you will vomit or you vomit. Summary A urinary tract infection (UTI) is an infection of any part of the urinary tract. This condition is caused by germs in your genital area. There are many risk factors for a UTI. Treatment includes antibiotic medicines. Drink enough fluid to keep your pee pale yellow. This information is not intended to replace advice given to you by your health care provider. Make sure you discuss any questions you have with your health care provider. Document Revised: 09/03/2019 Document Reviewed: 09/03/2019 Elsevier Patient Education    2023 Elsevier Inc.  

## 2021-05-28 NOTE — Progress Notes (Signed)
? ? ? ?Acute Office Visit ? ?Subjective:  ? ?  ?Patient ID: Christina Gonzales, female    DOB: 1945/04/19, 76 y.o.   MRN: [id number] ? ?Chief Complaint  ?Patient presents with  ? Urinary Tract Infection  ? ? ?Dysuria  ?This is a new problem. The current episode started yesterday. The problem occurs every urination. The problem has been unchanged. The quality of the pain is described as burning. The pain is at a severity of 5/10. The pain is moderate. There has been no fever. She is Not sexually active. There is No history of pyelonephritis. Pertinent negatives include no chills, discharge, flank pain or nausea. Treatments tried: [employer] brand Azo. The treatment provided no relief.  ? ? ?Review of Systems  ?Constitutional: Negative.  Negative for chills.  ?HENT: Negative.    ?Eyes: Negative.   ?Respiratory: Negative.    ?Cardiovascular: Negative.   ?Gastrointestinal:  Negative for nausea.  ?Genitourinary:  Positive for dysuria. Negative for flank pain.  ?Skin: Negative.  Negative for rash.  ?All other systems reviewed and are negative. ? ? ?   ?Objective:  ?  ?BP 140/64   Pulse 72   Temp 97.7 ?F (36.5 ?C)   Resp 20   Ht '5\' 2"'$  (1.575 m)   Wt 179 lb (81.2 kg)   SpO2 96%   BMI 32.74 kg/m?  ?BP Readings from Last 3 Encounters:  ?05/28/21 140/64  ?05/03/21 (!) 179/84  ?12/21/20 (!) 156/78  ? ?Wt Readings from Last 3 Encounters:  ?05/28/21 179 lb (81.2 kg)  ?05/03/21 179 lb (81.2 kg)  ?12/21/20 176 lb 9.6 oz (80.1 kg)  ? ?  ? ?Physical Exam ?Vitals and nursing note reviewed.  ?Constitutional:   ?   Appearance: Normal appearance.  ?HENT:  ?   Head: Normocephalic.  ?   Right Ear: External ear normal.  ?   Left Ear: External ear normal.  ?   Nose: Nose normal.  ?   Mouth/Throat:  ?   Mouth: Mucous membranes are moist.  ?   Pharynx: Oropharynx is clear.  ?Eyes:  ?   Conjunctiva/sclera: Conjunctivae normal.  ?Cardiovascular:  ?   Pulses: Normal pulses.  ?   Heart sounds: Normal heart sounds.  ?Pulmonary:  ?   Effort:  Pulmonary effort is normal.  ?   Breath sounds: Normal breath sounds.  ?Abdominal:  ?   General: Bowel sounds are normal.  ?   Tenderness: There is abdominal tenderness. There is no right CVA tenderness or left CVA tenderness.  ?Skin: ?   General: Skin is warm.  ?   Findings: No rash.  ?Neurological:  ?   General: No focal deficit present.  ?   Mental Status: She is alert and oriented to person, place, and time.  ? ? ?Results for orders placed or performed in visit on 05/28/21  ?Microscopic Examination  ? Urine  ?Result Value Ref Range  ? WBC, UA 11-30 (A) 0 - 5 /hpf  ? RBC 3-10 (A) 0 - 2 /hpf  ? Epithelial Cells (non renal) 0-10 0 - 10 /hpf  ? Renal Epithel, UA None seen None seen /hpf  ? Bacteria, UA Moderate (A) None seen/Few  ?Urinalysis, Complete  ?Result Value Ref Range  ? Specific Gravity, UA 1.020 1.005 - 1.030  ? pH, UA 5.0 5.0 - 7.5  ? Color, UA Orange Yellow  ? Appearance Ur Clear Clear  ? Leukocytes,UA Trace (A) Negative  ? Protein,UA 3+ (A) Negative/Trace  ?  Glucose, UA Trace (A) Negative  ? Ketones, UA 1+ (A) Negative  ? RBC, UA 2+ (A) Negative  ? Bilirubin, UA Negative Negative  ? Urobilinogen, Ur 4.0 (H) 0.2 - 1.0 mg/dL  ? Nitrite, UA Positive (A) Negative  ? Microscopic Examination See below:   ? ? ? ?   ?Assessment & Plan:  ? ? ? ?Appears well, in no apparent distress.  Vital signs are normal. The abdomen is soft without tenderness,guarding, mass, rebound or organomegaly. No CVA tenderness or inguinal adenopathy noted. Urine dipstick shows positive for WBC's, positive for RBC's, positive for protein, positive for nitrates, positive for leukocytes, and positive for ketones.  Micro exam: Moderate + bacteria.  ? ?UTI uncomplicated without evidence of pyelonephritis ? ?PLAN: Treatment per orders - also push fluids, may use Pyridium OTC prn. Call or return to clinic prn if these symptoms worsen or fail to improve as anticipated.   ? ?Problem List Items Addressed This Visit   ?None ?Visit Diagnoses   ? ?  Dysuria    -  Primary  ? Relevant Medications  ? phenazopyridine (PYRIDIUM) 95 MG tablet  ? sulfamethoxazole-trimethoprim (BACTRIM DS) 800-160 MG tablet  ? Other Relevant Orders  ? Urine Culture  ? Urinalysis, Complete (Completed)  ? ?  ? ? ?Meds ordered this encounter  ?Medications  ? phenazopyridine (PYRIDIUM) 95 MG tablet  ?  Sig: Take 1 tablet (95 mg total) by mouth 3 (three) times daily as needed for pain.  ?  Dispense:  10 tablet  ?  Refill:  0  ?  Order Specific Question:   Supervising Provider  ?  AnswerClaretta Fraise [[id number]]  ? sulfamethoxazole-trimethoprim (BACTRIM DS) 800-160 MG tablet  ?  Sig: Take 1 tablet by mouth 2 (two) times daily.  ?  Dispense:  14 tablet  ?  Refill:  0  ?  Order Specific Question:   Supervising Provider  ?  AnswerClaretta Fraise [[id number]]  ? ? ?Return if symptoms worsen or fail to improve. ? ?Ivy Lynn, NP ? ? ?

## 2021-05-30 LAB — URINE CULTURE

## 2021-05-31 ENCOUNTER — Encounter: Payer: Self-pay | Admitting: Family

## 2021-05-31 ENCOUNTER — Ambulatory Visit (INDEPENDENT_AMBULATORY_CARE_PROVIDER_SITE_OTHER): Payer: Medicare HMO | Admitting: Family

## 2021-05-31 VITALS — BP 133/58 | HR 90 | Ht 62.0 in | Wt 179.0 lb

## 2021-05-31 DIAGNOSIS — R69 Illness, unspecified: Secondary | ICD-10-CM | POA: Diagnosis not present

## 2021-05-31 DIAGNOSIS — N3000 Acute cystitis without hematuria: Secondary | ICD-10-CM | POA: Diagnosis not present

## 2021-05-31 DIAGNOSIS — I10 Essential (primary) hypertension: Secondary | ICD-10-CM | POA: Diagnosis not present

## 2021-05-31 DIAGNOSIS — F5101 Primary insomnia: Secondary | ICD-10-CM

## 2021-05-31 MED ORDER — AMLODIPINE BESYLATE 5 MG PO TABS: 5.0000 mg | ORAL_TABLET | Freq: Every day | ORAL | 1 refills | Status: DC

## 2021-05-31 MED ORDER — CIPROFLOXACIN HCL 500 MG PO TABS: 500.0000 mg | ORAL_TABLET | Freq: Two times a day (BID) | ORAL | 0 refills | Status: DC

## 2021-05-31 MED ORDER — LISINOPRIL 40 MG PO TABS: 40.0000 mg | ORAL_TABLET | Freq: Every day | ORAL | 1 refills | Status: DC

## 2021-05-31 MED ORDER — VITAMIN D (ERGOCALCIFEROL) 1.25 MG (50000 UNIT) PO CAPS: 50000.0000 [IU] | ORAL_CAPSULE | ORAL | 2 refills | Status: DC

## 2021-05-31 NOTE — Progress Notes (Signed)
? ?Subjective:  ? ? Patient ID: [name], female    DOB: 22-Sep-1945, 76 y.o.   MRN: [id number] ? ?Chief Complaint  ?Patient presents with  ? Medical Management of Chronic Issues  ? Hypertension  ? Insomnia  ? ?Pt presents to the office today to follow up on HTN and Insomnia. Her BP is at goal today!!! We added Norvasc 5 mg.  ? ?We started her trazodone 100 mg .  Reports this has been helping, but only takes as needed.  ? ?She is also taking bactrim for a UTI. States her symptoms have improved with this.  ?Hypertension ?This is a chronic problem. The current episode started more than 1 year ago. The problem has been resolved since onset. The problem is controlled. Associated symptoms include malaise/fatigue. Pertinent negatives include no peripheral edema or shortness of breath. Risk factors for coronary artery disease include dyslipidemia, obesity and sedentary lifestyle. The current treatment provides moderate improvement.  ?Insomnia ?Primary symptoms: difficulty falling asleep, frequent awakening, malaise/fatigue.   ?The current episode started more than one year. The onset quality is gradual. The problem occurs intermittently. The treatment provided moderate relief.  ? ? ? ?Review of Systems  ?Constitutional:  Positive for malaise/fatigue.  ?Respiratory:  Negative for shortness of breath.   ?Psychiatric/Behavioral:  The patient has insomnia.   ?All other systems reviewed and are negative. ? ?   ?Objective:  ? Physical Exam ?Vitals reviewed.  ?Constitutional:   ?   General: She is not in acute distress. ?   Appearance: She is well-developed. She is obese.  ?HENT:  ?   Head: Normocephalic and atraumatic.  ?   Right Ear: Tympanic membrane normal.  ?   Left Ear: Tympanic membrane normal.  ?Eyes:  ?   Pupils: Pupils are equal, round, and reactive to light.  ?Neck:  ?   Thyroid: No thyromegaly.  ?Cardiovascular:  ?   Rate and Rhythm: Normal rate and regular rhythm.  ?   Heart sounds: Normal heart sounds. No  murmur heard. ?Pulmonary:  ?   Effort: Pulmonary effort is normal. No respiratory distress.  ?   Breath sounds: Normal breath sounds. No wheezing.  ?Abdominal:  ?   General: Bowel sounds are normal. There is no distension.  ?   Palpations: Abdomen is soft.  ?   Tenderness: There is no abdominal tenderness.  ?Musculoskeletal:     ?   General: No tenderness. Normal range of motion.  ?   Cervical back: Normal range of motion and neck supple.  ?Skin: ?   General: Skin is warm and dry.  ?Neurological:  ?   Mental Status: She is alert and oriented to person, place, and time.  ?   Cranial Nerves: No cranial nerve deficit.  ?   Deep Tendon Reflexes: Reflexes are normal and symmetric.  ?Psychiatric:     ?   Behavior: Behavior normal.     ?   Thought Content: Thought content normal.     ?   Judgment: Judgment normal.  ? ? ? ? ?BP (!) 133/58   Pulse 90   Ht '5\' 2"'$  (1.575 m)   Wt 179 lb (81.2 kg)   SpO2 96%   BMI 32.74 kg/m?  ? ?   ?Assessment & Plan:  ?ZAREAH HUNZEKER comes in today with chief complaint of Medical Management of Chronic Issues, Hypertension, and Insomnia ? ? ?Diagnosis and orders addressed: ? ?1. Essential hypertension, benign ?Continue Lisinopril 40 mg and Norvasc  5 mg  ?-Daily blood pressure log given with instructions on how to fill out and told to bring to next visit ?-Dash diet information given ?-Exercise encouraged ?- Stress Management  ?-Continue current meds ?-RTO in 4 months  ?- lisinopril (ZESTRIL) 40 MG tablet; Take 1 tablet (40 mg total) by mouth daily.  Dispense: 90 tablet; Refill: 1 ?- amLODipine (NORVASC) 5 MG tablet; Take 1 tablet (5 mg total) by mouth daily.  Dispense: 90 tablet; Refill: 1 ? ?2. Primary insomnia ?Trazodone as needed ?Sleep ritual ? ?3. Acute cystitis without hematuria ?Stop Bactrim and start Cipro ?Force fluids ?AZO over the counter X2 days ? ? ? ?Health Maintenance reviewed ?Diet and exercise encouraged ? ?Follow up plan: ?4 months  ? ? ?[name], FNP ? ? ? ?

## 2021-05-31 NOTE — Patient Instructions (Signed)
Insomnia Insomnia is a sleep disorder that makes it difficult to fall asleep or stay asleep. Insomnia can cause fatigue, low energy, difficulty concentrating, mood swings, and poor performance at work or school. There are three different ways to classify insomnia: Difficulty falling asleep. Difficulty staying asleep. Waking up too early in the morning. Any type of insomnia can be long-term (chronic) or short-term (acute). Both are common. Short-term insomnia usually lasts for 3 months or less. Chronic insomnia occurs at least three times a week for longer than 3 months. What are the causes? Insomnia may be caused by another condition, situation, or substance, such as: Having certain mental health conditions, such as anxiety and depression. Using caffeine, alcohol, tobacco, or drugs. Having gastrointestinal conditions, such as gastroesophageal reflux disease (GERD). Having certain medical conditions. These include: Asthma. Alzheimer's disease. Stroke. Chronic pain. An overactive thyroid gland (hyperthyroidism). Other sleep disorders, such as restless legs syndrome and sleep apnea. Menopause. Sometimes, the cause of insomnia may not be known. What increases the risk? Risk factors for insomnia include: Gender. Females are affected more often than males. Age. Insomnia is more common as people get older. Stress and certain medical and mental health conditions. Lack of exercise. Having an irregular work schedule. This may include working night shifts and traveling between different time zones. What are the signs or symptoms? If you have insomnia, the main symptom is having trouble falling asleep or having trouble staying asleep. This may lead to other symptoms, such as: Feeling tired or having low energy. Feeling nervous about going to sleep. Not feeling rested in the morning. Having trouble concentrating. Feeling irritable, anxious, or depressed. How is this diagnosed? This condition  may be diagnosed based on: Your symptoms and medical history. Your health care provider may ask about: Your sleep habits. Any medical conditions you have. Your mental health. A physical exam. How is this treated? Treatment for insomnia depends on the cause. Treatment may focus on treating an underlying condition that is causing the insomnia. Treatment may also include: Medicines to help you sleep. Counseling or therapy. Lifestyle adjustments to help you sleep better. Follow these instructions at home: Eating and drinking  Limit or avoid alcohol, caffeinated beverages, and products that contain nicotine and tobacco, especially close to bedtime. These can disrupt your sleep. Do not eat a large meal or eat spicy foods right before bedtime. This can lead to digestive discomfort that can make it hard for you to sleep. Sleep habits  Keep a sleep diary to help you and your health care provider figure out what could be causing your insomnia. Write down: When you sleep. When you wake up during the night. How well you sleep and how rested you feel the next day. Any side effects of medicines you are taking. What you eat and drink. Make your bedroom a dark, comfortable place where it is easy to fall asleep. Put up shades or blackout curtains to block light from outside. Use a white noise machine to block noise. Keep the temperature cool. Limit screen use before bedtime. This includes: Not watching TV. Not using your smartphone, tablet, or computer. Stick to a routine that includes going to bed and waking up at the same times every day and night. This can help you fall asleep faster. Consider making a quiet activity, such as reading, part of your nighttime routine. Try to avoid taking naps during the day so that you sleep better at night. Get out of bed if you are still awake after   15 minutes of trying to sleep. Keep the lights down, but try reading or doing a quiet activity. When you feel  sleepy, go back to bed. General instructions Take over-the-counter and prescription medicines only as told by your health care provider. Exercise regularly as told by your health care provider. However, avoid exercising in the hours right before bedtime. Use relaxation techniques to manage stress. Ask your health care provider to suggest some techniques that may work well for you. These may include: Breathing exercises. Routines to release muscle tension. Visualizing peaceful scenes. Make sure that you drive carefully. Do not drive if you feel very sleepy. Keep all follow-up visits. This is important. Contact a health care provider if: You are tired throughout the day. You have trouble in your daily routine due to sleepiness. You continue to have sleep problems, or your sleep problems get worse. Get help right away if: You have thoughts about hurting yourself or someone else. Get help right away if you feel like you may hurt yourself or others, or have thoughts about taking your own life. Go to your nearest emergency room or: Call 911. Call the National Suicide Prevention Lifeline at 1-800-273-8255 or 988. This is open 24 hours a day. Text the Crisis Text Line at 741741. Summary Insomnia is a sleep disorder that makes it difficult to fall asleep or stay asleep. Insomnia can be long-term (chronic) or short-term (acute). Treatment for insomnia depends on the cause. Treatment may focus on treating an underlying condition that is causing the insomnia. Keep a sleep diary to help you and your health care provider figure out what could be causing your insomnia. This information is not intended to replace advice given to you by your health care provider. Make sure you discuss any questions you have with your health care provider. Document Revised: 01/01/2021 Document Reviewed: 01/01/2021 Elsevier Patient Education  2023 [employer] Inc.  

## 2021-08-01 ENCOUNTER — Telehealth: Payer: Self-pay | Admitting: Family

## 2021-08-01 NOTE — Telephone Encounter (Signed)
  Left message for patient to call back and schedule Medicare Annual Wellness Visit (AWV) to be completed by video or phone.   Last AWV: 08/10/2020  Please schedule at anytime with Raymond     45 minute appointment  Any questions, please contact me at [phone]

## 2021-08-16 ENCOUNTER — Other Ambulatory Visit (HOSPITAL_COMMUNITY): Payer: Self-pay | Admitting: Hematology

## 2021-08-16 DIAGNOSIS — Z86718 Personal history of other venous thrombosis and embolism: Secondary | ICD-10-CM

## 2021-08-28 ENCOUNTER — Ambulatory Visit (INDEPENDENT_AMBULATORY_CARE_PROVIDER_SITE_OTHER): Payer: Medicare HMO | Admitting: Nurse Practitioner

## 2021-08-28 ENCOUNTER — Encounter: Payer: Self-pay | Admitting: Nurse Practitioner

## 2021-08-28 VITALS — BP 171/74 | HR 83 | Temp 98.6°F | Ht 62.0 in | Wt 180.0 lb

## 2021-08-28 DIAGNOSIS — R300 Dysuria: Secondary | ICD-10-CM

## 2021-08-28 LAB — URINALYSIS, COMPLETE
Bilirubin, UA: NEGATIVE
Ketones, UA: NEGATIVE
Nitrite, UA: NEGATIVE
Protein,UA: NEGATIVE
Specific Gravity, UA: 1.015 (ref 1.005–1.030)
Urobilinogen, Ur: 0.2 mg/dL (ref 0.2–1.0)
pH, UA: 5.5 (ref 5.0–7.5)

## 2021-08-28 LAB — MICROSCOPIC EXAMINATION: Renal Epithel, UA: NONE SEEN /hpf

## 2021-08-28 MED ORDER — PHENAZOPYRIDINE HCL 95 MG PO TABS: 95.0000 mg | ORAL_TABLET | Freq: Three times a day (TID) | ORAL | 0 refills | Status: DC | PRN

## 2021-08-28 MED ORDER — NITROFURANTOIN MONOHYD MACRO 100 MG PO CAPS: 100.0000 mg | ORAL_CAPSULE | Freq: Two times a day (BID) | ORAL | 0 refills | Status: DC

## 2021-08-28 NOTE — Patient Instructions (Signed)

## 2021-08-28 NOTE — Progress Notes (Signed)
Acute Office Visit  Subjective:     Patient ID: Christina Gonzales, female    DOB: 30-Apr-1945, 76 y.o.   MRN: [id number]  Chief Complaint  Patient presents with   Dysuria   Back Pain    Been going on for about a week now     Dysuria  This is a new problem. Episode onset: in the past 3 days. The problem occurs every urination. The problem has been unchanged. The quality of the pain is described as aching and burning. The pain is moderate. There has been no fever. She is Not sexually active. There is No history of pyelonephritis. Associated symptoms include flank pain. Pertinent negatives include no chills. She has tried nothing for the symptoms.  Back Pain This is a new problem. The current episode started yesterday. The problem occurs constantly. The problem is unchanged. The quality of the pain is described as aching. The pain is moderate. Associated symptoms include dysuria. Pertinent negatives include no abdominal pain or fever. Risk factors include menopause, lack of exercise and sedentary lifestyle.     Review of Systems  Constitutional: Negative.  Negative for chills and fever.  HENT: Negative.    Respiratory: Negative.    Cardiovascular: Negative.   Gastrointestinal:  Negative for abdominal pain.  Genitourinary:  Positive for dysuria and flank pain.  Skin: Negative.  Negative for rash.  All other systems reviewed and are negative.       Objective:    BP (!) 171/74   Pulse 83   Temp 98.6 F (37 C)   Ht '5\' 2"'$  (1.575 m)   Wt 180 lb (81.6 kg)   SpO2 95%   BMI 32.92 kg/m  BP Readings from Last 3 Encounters:  08/28/21 (!) 171/74  05/31/21 (!) 133/58  05/28/21 140/64   Wt Readings from Last 3 Encounters:  08/28/21 180 lb (81.6 kg)  05/31/21 179 lb (81.2 kg)  05/28/21 179 lb (81.2 kg)      Physical Exam Vitals and nursing note reviewed.  HENT:     Right Ear: External ear normal.     Left Ear: External ear normal.     Nose: Nose normal.      Mouth/Throat:     Mouth: Mucous membranes are moist.     Pharynx: Oropharynx is clear.  Eyes:     Conjunctiva/sclera: Conjunctivae normal.  Cardiovascular:     Rate and Rhythm: Normal rate and regular rhythm.  Pulmonary:     Effort: Pulmonary effort is normal.     Breath sounds: Normal breath sounds.  Abdominal:     General: Bowel sounds are normal.     Tenderness: There is right CVA tenderness.     Hernia: No hernia is present.  Musculoskeletal:        General: Normal range of motion.  Skin:    General: Skin is warm.  Neurological:     General: No focal deficit present.     Mental Status: She is alert and oriented to person, place, and time.  Psychiatric:        Behavior: Behavior normal.     Results for orders placed or performed in visit on 08/28/21  Microscopic Examination   Urine  Result Value Ref Range   WBC, UA 0-5 0 - 5 /hpf   RBC, Urine 0-2 0 - 2 /hpf   Epithelial Cells (non renal) 0-10 0 - 10 /hpf   Renal Epithel, UA None seen None seen /hpf   Bacteria,  UA Few (A) None seen/Few  Urinalysis, Complete  Result Value Ref Range   Specific Gravity, UA 1.015 1.005 - 1.030   pH, UA 5.5 5.0 - 7.5   Color, UA Yellow Yellow   Appearance Ur Clear Clear   Leukocytes,UA Trace (A) Negative   Protein,UA Negative Negative/Trace   Glucose, UA Trace (A) Negative   Ketones, UA Negative Negative   RBC, UA Trace (A) Negative   Bilirubin, UA Negative Negative   Urobilinogen, Ur 0.2 0.2 - 1.0 mg/dL   Nitrite, UA Negative Negative   Microscopic Examination See below:         Assessment & Plan:    Appears well, in no apparent distress.  Vital signs are normal. The abdomen is soft without tenderness, guarding, mass, rebound or organomegaly. CVA tenderness, no inguinal adenopathy noted. Urine dipstick shows positive for WBC's and positive for leukocytes.  Micro exam: few+ bacteria.   UTI uncomplicated without evidence of pyelonephritis  PLAN: Treatment per orders - also  push fluids, may use Pyridium OTC prn. Call or return to clinic prn if these symptoms worsen or fail to improve as anticipated.     Problem List Items Addressed This Visit   None Visit Diagnoses     Dysuria    -  Primary   Relevant Medications   nitrofurantoin, macrocrystal-monohydrate, (MACROBID) 100 MG capsule   phenazopyridine (PYRIDIUM) 95 MG tablet   Other Relevant Orders   Urine Culture   Urinalysis, Complete (Completed)       Meds ordered this encounter  Medications   nitrofurantoin, macrocrystal-monohydrate, (MACROBID) 100 MG capsule    Sig: Take 1 capsule (100 mg total) by mouth 2 (two) times daily. 1 po BId    Dispense:  14 capsule    Refill:  0    Order Specific Question:   Supervising Provider    Answer:   Jeneen Rinks   phenazopyridine (PYRIDIUM) 95 MG tablet    Sig: Take 1 tablet (95 mg total) by mouth 3 (three) times daily as needed for pain.    Dispense:  10 tablet    Refill:  0    Order Specific Question:   Supervising Provider    Answer:   Claretta Fraise [phone]    No follow-ups on file.  Ivy Lynn, NP

## 2021-08-30 LAB — URINE CULTURE

## 2021-09-06 ENCOUNTER — Other Ambulatory Visit: Payer: Self-pay | Admitting: Family

## 2021-09-06 DIAGNOSIS — E785 Hyperlipidemia, unspecified: Secondary | ICD-10-CM

## 2021-10-15 ENCOUNTER — Other Ambulatory Visit: Payer: Self-pay | Admitting: Family

## 2021-10-15 DIAGNOSIS — M159 Polyosteoarthritis, unspecified: Secondary | ICD-10-CM

## 2021-10-15 NOTE — Telephone Encounter (Signed)
Lmtcb.

## 2021-10-15 NOTE — Telephone Encounter (Signed)
Tramadol denied. Ntbs for refill on controlled substance.

## 2021-10-30 ENCOUNTER — Encounter: Payer: Self-pay | Admitting: Family

## 2021-10-30 ENCOUNTER — Ambulatory Visit: Payer: Medicare HMO | Admitting: Family

## 2021-11-13 ENCOUNTER — Encounter: Payer: Self-pay | Admitting: Family

## 2021-11-13 ENCOUNTER — Other Ambulatory Visit: Payer: Self-pay | Admitting: Family

## 2021-11-13 ENCOUNTER — Ambulatory Visit (INDEPENDENT_AMBULATORY_CARE_PROVIDER_SITE_OTHER): Payer: Medicare HMO | Admitting: Family

## 2021-11-13 VITALS — BP 134/71 | HR 74 | Temp 97.0°F | Ht 62.0 in | Wt 175.2 lb

## 2021-11-13 DIAGNOSIS — E785 Hyperlipidemia, unspecified: Secondary | ICD-10-CM

## 2021-11-13 DIAGNOSIS — F320 Major depressive disorder, single episode, mild: Secondary | ICD-10-CM | POA: Diagnosis not present

## 2021-11-13 DIAGNOSIS — R69 Illness, unspecified: Secondary | ICD-10-CM | POA: Diagnosis not present

## 2021-11-13 DIAGNOSIS — F411 Generalized anxiety disorder: Secondary | ICD-10-CM | POA: Diagnosis not present

## 2021-11-13 DIAGNOSIS — Z0001 Encounter for general adult medical examination with abnormal findings: Secondary | ICD-10-CM

## 2021-11-13 DIAGNOSIS — F5101 Primary insomnia: Secondary | ICD-10-CM

## 2021-11-13 DIAGNOSIS — I10 Essential (primary) hypertension: Secondary | ICD-10-CM

## 2021-11-13 DIAGNOSIS — Z86718 Personal history of other venous thrombosis and embolism: Secondary | ICD-10-CM | POA: Diagnosis not present

## 2021-11-13 DIAGNOSIS — R569 Unspecified convulsions: Secondary | ICD-10-CM | POA: Diagnosis not present

## 2021-11-13 DIAGNOSIS — E669 Obesity, unspecified: Secondary | ICD-10-CM | POA: Diagnosis not present

## 2021-11-13 DIAGNOSIS — Z0000 Encounter for general adult medical examination without abnormal findings: Secondary | ICD-10-CM

## 2021-11-13 DIAGNOSIS — Z23 Encounter for immunization: Secondary | ICD-10-CM

## 2021-11-13 MED ORDER — ZOLPIDEM TARTRATE 5 MG PO TABS: 5.0000 mg | ORAL_TABLET | Freq: Every evening | ORAL | 2 refills | Status: DC | PRN

## 2021-11-13 NOTE — Patient Instructions (Addendum)
Insomnia Insomnia is a sleep disorder that makes it difficult to fall asleep or stay asleep. Insomnia can cause fatigue, low energy, difficulty concentrating, mood swings, and poor performance at work or school. There are three different ways to classify insomnia: Difficulty falling asleep. Difficulty staying asleep. Waking up too early in the morning. Any type of insomnia can be long-term (chronic) or short-term (acute). Both are common. Short-term insomnia usually lasts for 3 months or less. Chronic insomnia occurs at least three times a week for longer than 3 months. What are the causes? Insomnia may be caused by another condition, situation, or substance, such as: Having certain mental health conditions, such as anxiety and depression. Using caffeine, alcohol, tobacco, or drugs. Having gastrointestinal conditions, such as gastroesophageal reflux disease (GERD). Having certain medical conditions. These include: Asthma. Alzheimer's disease. Stroke. Chronic pain. An overactive thyroid gland (hyperthyroidism). Other sleep disorders, such as restless legs syndrome and sleep apnea. Menopause. Sometimes, the cause of insomnia may not be known. What increases the risk? Risk factors for insomnia include: Gender. Females are affected more often than males. Age. Insomnia is more common as people get older. Stress and certain medical and mental health conditions. Lack of exercise. Having an irregular work schedule. This may include working night shifts and traveling between different time zones. What are the signs or symptoms? If you have insomnia, the main symptom is having trouble falling asleep or having trouble staying asleep. This may lead to other symptoms, such as: Feeling tired or having low energy. Feeling nervous about going to sleep. Not feeling rested in the morning. Having trouble concentrating. Feeling irritable, anxious, or depressed. How is this diagnosed? This condition  may be diagnosed based on: Your symptoms and medical history. Your health care provider may ask about: Your sleep habits. Any medical conditions you have. Your mental health. A physical exam. How is this treated? Treatment for insomnia depends on the cause. Treatment may focus on treating an underlying condition that is causing the insomnia. Treatment may also include: Medicines to help you sleep. Counseling or therapy. Lifestyle adjustments to help you sleep better. Follow these instructions at home: Eating and drinking  Limit or avoid alcohol, caffeinated beverages, and products that contain nicotine and tobacco, especially close to bedtime. These can disrupt your sleep. Do not eat a large meal or eat spicy foods right before bedtime. This can lead to digestive discomfort that can make it hard for you to sleep. Sleep habits  Keep a sleep diary to help you and your health care provider figure out what could be causing your insomnia. Write down: When you sleep. When you wake up during the night. How well you sleep and how rested you feel the next day. Any side effects of medicines you are taking. What you eat and drink. Make your bedroom a dark, comfortable place where it is easy to fall asleep. Put up shades or blackout curtains to block light from outside. Use a white noise machine to block noise. Keep the temperature cool. Limit screen use before bedtime. This includes: Not watching TV. Not using your smartphone, tablet, or computer. Stick to a routine that includes going to bed and waking up at the same times every day and night. This can help you fall asleep faster. Consider making a quiet activity, such as reading, part of your nighttime routine. Try to avoid taking naps during the day so that you sleep better at night. Get out of bed if you are still awake after   15 minutes of trying to sleep. Keep the lights down, but try reading or doing a quiet activity. When you feel  sleepy, go back to bed. General instructions Take over-the-counter and prescription medicines only as told by your health care provider. Exercise regularly as told by your health care provider. However, avoid exercising in the hours right before bedtime. Use relaxation techniques to manage stress. Ask your health care provider to suggest some techniques that may work well for you. These may include: Breathing exercises. Routines to release muscle tension. Visualizing peaceful scenes. Make sure that you drive carefully. Do not drive if you feel very sleepy. Keep all follow-up visits. This is important. Contact a health care provider if: You are tired throughout the day. You have trouble in your daily routine due to sleepiness. You continue to have sleep problems, or your sleep problems get worse. Get help right away if: You have thoughts about hurting yourself or someone else. Get help right away if you feel like you may hurt yourself or others, or have thoughts about taking your own life. Go to your nearest emergency room or: Call 911. Call the National Suicide Prevention Lifeline at 1-800-273-8255 or 988. This is open 24 hours a day. Text the Crisis Text Line at 741741. Summary Insomnia is a sleep disorder that makes it difficult to fall asleep or stay asleep. Insomnia can be long-term (chronic) or short-term (acute). Treatment for insomnia depends on the cause. Treatment may focus on treating an underlying condition that is causing the insomnia. Keep a sleep diary to help you and your health care provider figure out what could be causing your insomnia. This information is not intended to replace advice given to you by your health care provider. Make sure you discuss any questions you have with your health care provider. Document Revised: 01/01/2021 Document Reviewed: 01/01/2021 Elsevier Patient Education  2023 [employer].  

## 2021-11-13 NOTE — Addendum Note (Signed)
Addended by: Brynda Peon F on: 11/13/2021 12:50 PM   Modules accepted: Orders

## 2021-11-13 NOTE — Progress Notes (Signed)
Subjective:    Patient ID: [name], female    DOB: [birth year], 76 y.o.   MRN: [id number]  Chief Complaint  Patient presents with   Medical Management of Chronic Issues   Pt presents to the office today with CPE and chronic follow up and joint pain. She is followed by Neurologists for seizures.     She takes Eliquis 5 mg for hx of DVT.  Hypertension This is a chronic problem. The current episode started more than 1 year ago. The problem has been resolved since onset. The problem is controlled. Associated symptoms include anxiety. Pertinent negatives include no malaise/fatigue, peripheral edema or shortness of breath. Risk factors for coronary artery disease include dyslipidemia and sedentary lifestyle. The current treatment provides moderate improvement. There is no history of heart failure.  Insomnia Primary symptoms: no fragmented sleep, difficulty falling asleep, frequent awakening, no malaise/fatigue.   The current episode started more than one year. The onset quality is gradual. The problem occurs intermittently. Past treatments include medication. The treatment provided moderate relief. PMH includes: depression.   Seizures  This is a chronic problem. The current episode started more than 1 week ago.  Hyperlipidemia This is a chronic problem. The current episode started more than 1 year ago. The problem is uncontrolled. Recent lipid tests were reviewed and are high. Pertinent negatives include no shortness of breath. Current antihyperlipidemic treatment includes statins. The current treatment provides moderate improvement of lipids. Risk factors for coronary artery disease include dyslipidemia, hypertension, a sedentary lifestyle and post-menopausal.  Anxiety Presents for follow-up visit. Symptoms include excessive worry, insomnia, irritability and nervous/anxious behavior. Patient reports no shortness of breath. Symptoms occur occasionally. The severity of symptoms is mild.     Depression        This is a chronic problem.  The current episode started more than 1 year ago.   Associated symptoms include insomnia.  Associated symptoms include no helplessness, no hopelessness and not sad.  Past medical history includes anxiety.       Review of Systems  Constitutional:  Positive for irritability. Negative for malaise/fatigue.  Respiratory:  Negative for shortness of breath.   Neurological:  Positive for seizures.  Psychiatric/Behavioral:  Positive for depression. The patient is nervous/anxious and has insomnia.   All other systems reviewed and are negative.  Family History  Problem Relation Age of Onset   Cancer Mother        lung   Diabetes Brother    Social History   Socioeconomic History   Marital status: Married    Spouse name: Gwyndolyn Saxon   Number of children: 3   Years of education: 12   Highest education level: 12th grade  Occupational History   Not on file  Tobacco Use   Smoking status: Never   Smokeless tobacco: Never  Vaping Use   Vaping Use: Never used  Substance and Sexual Activity   Alcohol use: No   Drug use: No   Sexual activity: Not Currently  Other Topics Concern   Not on file  Social History Narrative   Retired, lives with husband [place]. Two daughters living, one son deceased. Enjoys walking.    Social Determinants of Health   Financial Resource Strain: Not on file  Food Insecurity: Not on file  Transportation Needs: Not on file  Physical Activity: Not on file  Stress: Not on file  Social Connections: Not on file       Objective:   Physical Exam Vitals reviewed.  Constitutional:      General: She is not in acute distress.    Appearance: She is well-developed.  HENT:     Head: Normocephalic and atraumatic.     Right Ear: Tympanic membrane normal.     Left Ear: Tympanic membrane normal.  Eyes:     Pupils: Pupils are equal, round, and reactive to light.  Neck:     Thyroid: No thyromegaly.  Cardiovascular:      Rate and Rhythm: Normal rate and regular rhythm.     Heart sounds: Normal heart sounds. No murmur heard. Pulmonary:     Effort: Pulmonary effort is normal. No respiratory distress.     Breath sounds: Normal breath sounds. No wheezing.  Abdominal:     General: Bowel sounds are normal. There is no distension.     Palpations: Abdomen is soft.     Tenderness: There is no abdominal tenderness.  Musculoskeletal:        General: No tenderness. Normal range of motion.     Cervical back: Normal range of motion and neck supple.  Skin:    General: Skin is warm and dry.  Neurological:     Mental Status: She is alert and oriented to person, place, and time.     Cranial Nerves: No cranial nerve deficit.     Deep Tendon Reflexes: Reflexes are normal and symmetric.  Psychiatric:        Behavior: Behavior normal.        Thought Content: Thought content normal.        Judgment: Judgment normal.       BP 134/71   Pulse 74   Temp (!) 97 F (36.1 C) (Temporal)   Ht '5\' 2"'  (1.575 m)   Wt 175 lb 3.2 oz (79.5 kg)   SpO2 95%   BMI 32.04 kg/m      Assessment & Plan:  DEVANN CRIBB comes in today with chief complaint of Medical Management of Chronic Issues   Diagnosis and orders addressed:  1. Essential hypertension, benign - CMP14+EGFR - CBC with Differential/Platelet  2. Seizures ([place]) - CMP14+EGFR - CBC with Differential/Platelet  3. Hyperlipidemia, unspecified hyperlipidemia type - CMP14+EGFR - CBC with Differential/Platelet - Lipid panel  4. Obesity (BMI 30-39.9) - CMP14+EGFR - CBC with Differential/Platelet  5. GAD (generalized anxiety disorder) - CMP14+EGFR - CBC with Differential/Platelet  6. Current mild episode of major depressive disorder without prior episode (HCC) - CMP14+EGFR - CBC with Differential/Platelet  7. History of deep vein thrombosis (DVT) of lower extremity - CMP14+EGFR - CBC with Differential/Platelet  8. Primary insomnia Will start  Ambien 5 mg  Sleep ritual discussed  - CMP14+EGFR - CBC with Differential/Platelet - zolpidem (AMBIEN) 5 MG tablet; Take 1 tablet (5 mg total) by mouth at bedtime as needed for sleep.  Dispense: 30 tablet; Refill: 2  9. Annual physical exam == - CMP14+EGFR - CBC with Differential/Platelet - zolpidem (AMBIEN) 5 MG tablet; Take 1 tablet (5 mg total) by mouth at bedtime as needed for sleep.  Dispense: 30 tablet; Refill: 2 - Lipid panel - TSH   Labs pending Health Maintenance reviewed Diet and exercise encouraged  Follow up plan: 3 months   [name], FNP

## 2021-11-14 ENCOUNTER — Other Ambulatory Visit: Payer: Self-pay | Admitting: Family

## 2021-11-14 DIAGNOSIS — I10 Essential (primary) hypertension: Secondary | ICD-10-CM

## 2021-11-14 LAB — LIPID PANEL
Chol/HDL Ratio: 2.6 ratio (ref 0.0–4.4)
Cholesterol, Total: 155 mg/dL (ref 100–199)
HDL: 59 mg/dL (ref >39)
LDL Chol Calc (NIH): 79 mg/dL (ref 0–99)
Triglycerides: 91 mg/dL (ref 0–149)
VLDL Cholesterol Cal: 17 mg/dL (ref 5–40)

## 2021-11-14 LAB — CMP14+EGFR
ALT: 76 IU/L — ABNORMAL HIGH (ref 0–32)
AST: 64 IU/L — ABNORMAL HIGH (ref 0–40)
Albumin/Globulin Ratio: 1.5 (ref 1.2–2.2)
Albumin: 4.3 g/dL (ref 3.8–4.8)
Alkaline Phosphatase: 39 IU/L — ABNORMAL LOW (ref 44–121)
BUN/Creatinine Ratio: 33 — ABNORMAL HIGH (ref 12–28)
BUN: 20 mg/dL (ref 8–27)
Bilirubin Total: 0.3 mg/dL (ref 0.0–1.2)
CO2: 23 mmol/L (ref 20–29)
Calcium: 9.6 mg/dL (ref 8.7–10.3)
Chloride: 103 mmol/L (ref 96–106)
Creatinine, Ser: 0.6 mg/dL (ref 0.57–1.00)
Globulin, Total: 2.8 g/dL (ref 1.5–4.5)
Glucose: 82 mg/dL (ref 70–99)
Potassium: 4.3 mmol/L (ref 3.5–5.2)
Sodium: 144 mmol/L (ref 134–144)
Total Protein: 7.1 g/dL (ref 6.0–8.5)
eGFR: 93 mL/min/{1.73_m2} (ref >59)

## 2021-11-14 LAB — CBC WITH DIFFERENTIAL/PLATELET
Basophils Absolute: 0 10*3/uL (ref 0.0–0.2)
Basos: 1 %
EOS (ABSOLUTE): 0.2 10*3/uL (ref 0.0–0.4)
Eos: 2 %
Hematocrit: 40.7 % (ref 34.0–46.6)
Hemoglobin: 13.6 g/dL (ref 11.1–15.9)
Immature Grans (Abs): 0 10*3/uL (ref 0.0–0.1)
Immature Granulocytes: 0 %
Lymphocytes Absolute: 3.2 10*3/uL — ABNORMAL HIGH (ref 0.7–3.1)
Lymphs: 42 %
MCH: 29.8 pg (ref 26.6–33.0)
MCHC: 33.4 g/dL (ref 31.5–35.7)
MCV: 89 fL (ref 79–97)
Monocytes Absolute: 0.5 10*3/uL (ref 0.1–0.9)
Monocytes: 6 %
Neutrophils Absolute: 3.6 10*3/uL (ref 1.4–7.0)
Neutrophils: 49 %
Platelets: 211 10*3/uL (ref 150–450)
RBC: 4.57 x10E6/uL (ref 3.77–5.28)
RDW: 13 % (ref 11.7–15.4)
WBC: 7.5 10*3/uL (ref 3.4–10.8)

## 2021-11-14 LAB — TSH: TSH: 2.64 u[IU]/mL (ref 0.450–4.500)

## 2021-11-18 ENCOUNTER — Other Ambulatory Visit: Payer: Self-pay | Admitting: Family

## 2021-11-18 DIAGNOSIS — M159 Polyosteoarthritis, unspecified: Secondary | ICD-10-CM

## 2021-11-29 ENCOUNTER — Other Ambulatory Visit: Payer: Self-pay | Admitting: Family

## 2021-11-29 DIAGNOSIS — E785 Hyperlipidemia, unspecified: Secondary | ICD-10-CM

## 2022-01-08 ENCOUNTER — Other Ambulatory Visit: Payer: Self-pay | Admitting: *Deleted

## 2022-01-08 ENCOUNTER — Other Ambulatory Visit (HOSPITAL_COMMUNITY): Payer: Self-pay | Admitting: Hematology

## 2022-01-08 ENCOUNTER — Other Ambulatory Visit: Payer: Self-pay

## 2022-01-08 DIAGNOSIS — Z86718 Personal history of other venous thrombosis and embolism: Secondary | ICD-10-CM

## 2022-01-08 DIAGNOSIS — C9000 Multiple myeloma not having achieved remission: Secondary | ICD-10-CM

## 2022-01-08 DIAGNOSIS — D508 Other iron deficiency anemias: Secondary | ICD-10-CM

## 2022-01-08 DIAGNOSIS — D500 Iron deficiency anemia secondary to blood loss (chronic): Secondary | ICD-10-CM

## 2022-01-08 DIAGNOSIS — E538 Deficiency of other specified B group vitamins: Secondary | ICD-10-CM

## 2022-01-08 NOTE — Telephone Encounter (Signed)
Refill on Eliquis sent for 30 days supply.  Appointment made for labs and follow up.

## 2022-01-08 NOTE — Telephone Encounter (Signed)
1 month refill for Eliquis approved.  Patient has not been seen in office recently.  Will contact for an appointment asap.

## 2022-01-10 ENCOUNTER — Inpatient Hospital Stay: Payer: Medicare HMO | Attending: Hematology

## 2022-01-10 DIAGNOSIS — Z86718 Personal history of other venous thrombosis and embolism: Secondary | ICD-10-CM | POA: Diagnosis not present

## 2022-01-10 DIAGNOSIS — E559 Vitamin D deficiency, unspecified: Secondary | ICD-10-CM | POA: Insufficient documentation

## 2022-01-10 DIAGNOSIS — E538 Deficiency of other specified B group vitamins: Secondary | ICD-10-CM | POA: Diagnosis not present

## 2022-01-10 DIAGNOSIS — Z7901 Long term (current) use of anticoagulants: Secondary | ICD-10-CM | POA: Insufficient documentation

## 2022-01-10 DIAGNOSIS — R918 Other nonspecific abnormal finding of lung field: Secondary | ICD-10-CM | POA: Insufficient documentation

## 2022-01-10 DIAGNOSIS — D508 Other iron deficiency anemias: Secondary | ICD-10-CM

## 2022-01-10 DIAGNOSIS — C9000 Multiple myeloma not having achieved remission: Secondary | ICD-10-CM

## 2022-01-10 DIAGNOSIS — D500 Iron deficiency anemia secondary to blood loss (chronic): Secondary | ICD-10-CM

## 2022-01-10 LAB — COMPREHENSIVE METABOLIC PANEL
ALT: 89 U/L — ABNORMAL HIGH (ref 0–44)
AST: 62 U/L — ABNORMAL HIGH (ref 15–41)
Albumin: 3.8 g/dL (ref 3.5–5.0)
Alkaline Phosphatase: 36 U/L — ABNORMAL LOW (ref 38–126)
Anion gap: 6 (ref 5–15)
BUN: 18 mg/dL (ref 8–23)
CO2: 24 mmol/L (ref 22–32)
Calcium: 9 mg/dL (ref 8.9–10.3)
Chloride: 104 mmol/L (ref 98–111)
Creatinine, Ser: 0.65 mg/dL (ref 0.44–1.00)
GFR, Estimated: >60 mL/min (ref >60)
Glucose, Bld: 350 mg/dL — ABNORMAL HIGH (ref 70–99)
Potassium: 4 mmol/L (ref 3.5–5.1)
Sodium: 134 mmol/L — ABNORMAL LOW (ref 135–145)
Total Bilirubin: 0.5 mg/dL (ref 0.3–1.2)
Total Protein: 7.4 g/dL (ref 6.5–8.1)

## 2022-01-10 LAB — D-DIMER, QUANTITATIVE: D-Dimer, Quant: <0.27 ug/mL-FEU (ref 0.00–0.50)

## 2022-01-10 LAB — VITAMIN D 25 HYDROXY (VIT D DEFICIENCY, FRACTURES): Vit D, 25-Hydroxy: 84.32 ng/mL (ref 30–100)

## 2022-01-10 LAB — VITAMIN B12: Vitamin B-12: 155 pg/mL — ABNORMAL LOW (ref 180–914)

## 2022-01-14 ENCOUNTER — Ambulatory Visit: Payer: Medicare HMO | Admitting: Family Medicine

## 2022-01-15 ENCOUNTER — Telehealth (INDEPENDENT_AMBULATORY_CARE_PROVIDER_SITE_OTHER): Payer: Medicare HMO | Admitting: Family

## 2022-01-15 ENCOUNTER — Encounter: Payer: Self-pay | Admitting: Family

## 2022-01-15 DIAGNOSIS — R051 Acute cough: Secondary | ICD-10-CM | POA: Diagnosis not present

## 2022-01-15 DIAGNOSIS — J40 Bronchitis, not specified as acute or chronic: Secondary | ICD-10-CM | POA: Diagnosis not present

## 2022-01-15 DIAGNOSIS — R509 Fever, unspecified: Secondary | ICD-10-CM | POA: Diagnosis not present

## 2022-01-15 DIAGNOSIS — R059 Cough, unspecified: Secondary | ICD-10-CM | POA: Diagnosis not present

## 2022-01-15 DIAGNOSIS — R0981 Nasal congestion: Secondary | ICD-10-CM | POA: Diagnosis not present

## 2022-01-15 LAB — METHYLMALONIC ACID, SERUM: Methylmalonic Acid, Quantitative: 525 nmol/L — ABNORMAL HIGH (ref 0–378)

## 2022-01-15 MED ORDER — FLUTICASONE PROPIONATE 50 MCG/ACT NA SUSP: 2.0000 | Freq: Every day | NASAL | 6 refills | Status: DC

## 2022-01-15 MED ORDER — BENZONATATE 200 MG PO CAPS: 200.0000 mg | ORAL_CAPSULE | Freq: Three times a day (TID) | ORAL | 1 refills | Status: DC | PRN

## 2022-01-15 MED ORDER — PROMETHAZINE-DM 6.25-15 MG/5ML PO SYRP: 5.0000 mL | ORAL_SOLUTION | Freq: Three times a day (TID) | ORAL | 0 refills | Status: DC | PRN

## 2022-01-15 NOTE — Patient Instructions (Signed)

## 2022-01-15 NOTE — Progress Notes (Signed)
Virtual Visit  Note Due to COVID-19 pandemic this visit was conducted virtually. This visit type was conducted due to national recommendations for restrictions regarding the COVID-19 Pandemic (e.g. social distancing, sheltering in place) in an effort to limit this patient's exposure and mitigate transmission in our community. All issues noted in this document were discussed and addressed.  A physical exam was not performed with this format.  I connected with Christina Gonzales on 01/15/22 at 12:37 pm  by telephone and verified that I am speaking with the correct person using two identifiers. Christina Gonzales is currently located at home and no one is currently with her during visit. The provider, [name], FNP is located in their office at time of visit.  I discussed the limitations, risks, security and privacy concerns of performing an evaluation and management service by telephone and the availability of in person appointments. I also discussed with the patient that there may be a patient responsible charge related to this service. The patient expressed understanding and agreed to proceed.  Christina Gonzales, Christina Gonzales are scheduled for a virtual visit with your provider today.    Just as we do with appointments in the office, we must obtain your consent to participate.  Your consent will be active for this visit and any virtual visit you may have with one of our providers in the next 365 days.    If you have a MyChart account, I can also send a copy of this consent to you electronically.  All virtual visits are billed to your insurance company just like a traditional visit in the office.  As this is a virtual visit, video technology does not allow for your provider to perform a traditional examination.  This may limit your provider's ability to fully assess your condition.  If your provider identifies any concerns that need to be evaluated in person or the need to arrange testing such as labs, EKG,  etc, we will make arrangements to do so.    Although advances in technology are sophisticated, we cannot ensure that it will always work on either your end or our end.  If the connection with a video visit is poor, we may have to switch to a telephone visit.  With either a video or telephone visit, we are not always able to ensure that we have a secure connection.   I need to obtain your verbal consent now.   Are you willing to proceed with your visit today?   Christina Gonzales has provided verbal consent on 01/15/2022 for a virtual visit (video or telephone).   [name], [place] 01/15/2022  12:43 PM    History and Present Illness:  Cough This is a new problem. The current episode started in the past 7 days. The problem has been unchanged. The problem occurs every few minutes. The cough is Non-productive. Associated symptoms include nasal congestion. Pertinent negatives include no ear congestion, ear pain, fever, headaches, myalgias, sore throat, shortness of breath or wheezing. She has tried rest and OTC cough suppressant for the symptoms. The treatment provided mild relief.      Review of Systems  Constitutional:  Negative for fever.  HENT:  Negative for ear pain and sore throat.   Respiratory:  Positive for cough. Negative for shortness of breath and wheezing.   Musculoskeletal:  Negative for myalgias.  Neurological:  Negative for headaches.     Observations/Objective: No SOB or distress noted   Assessment and Plan: 1. Acute cough -  Take meds as prescribed - Use a cool mist humidifier  -Use saline nose sprays frequently -Force fluids -For any cough or congestion  Use plain Mucinex- regular strength or max strength is fine -For fever or aces or pains- take tylenol or ibuprofen. -Throat lozenges if help -Follow up if symptoms worsen or do not improve  Has appointment at 3 to be tested for COVID and Flu, will let me know - benzonatate (TESSALON) 200 MG capsule; Take 1  capsule (200 mg total) by mouth 3 (three) times daily as needed.  Dispense: 30 capsule; Refill: 1 - fluticasone (FLONASE) 50 MCG/ACT nasal spray; Place 2 sprays into both nostrils daily.  Dispense: 16 g; Refill: 6 - promethazine-dextromethorphan (PROMETHAZINE-DM) 6.25-15 MG/5ML syrup; Take 5 mLs by mouth 3 (three) times daily as needed for cough.  Dispense: 118 mL; Refill: 0    I discussed the assessment and treatment plan with the patient. The patient was provided an opportunity to ask questions and all were answered. The patient agreed with the plan and demonstrated an understanding of the instructions.   The patient was advised to call back or seek an in-person evaluation if the symptoms worsen or if the condition fails to improve as anticipated.  The above assessment and management plan was discussed with the patient. The patient verbalized understanding of and has agreed to the management plan. Patient is aware to call the clinic if symptoms persist or worsen. Patient is aware when to return to the clinic for a follow-up visit. Patient educated on when it is appropriate to go to the emergency department.   Time call ended: 12:49 pm    I provided 12 minutes of  non face-to-face time during this encounter.    [name], FNP

## 2022-01-16 ENCOUNTER — Other Ambulatory Visit: Payer: Self-pay

## 2022-01-16 ENCOUNTER — Inpatient Hospital Stay (HOSPITAL_BASED_OUTPATIENT_CLINIC_OR_DEPARTMENT_OTHER): Payer: Medicare HMO | Admitting: Physician Assistant

## 2022-01-16 ENCOUNTER — Inpatient Hospital Stay: Payer: Medicare HMO

## 2022-01-16 VITALS — BP 164/73 | HR 82 | Temp 98.1°F | Resp 17 | Ht 62.0 in | Wt 176.4 lb

## 2022-01-16 DIAGNOSIS — Z7901 Long term (current) use of anticoagulants: Secondary | ICD-10-CM | POA: Diagnosis not present

## 2022-01-16 DIAGNOSIS — Z86718 Personal history of other venous thrombosis and embolism: Secondary | ICD-10-CM | POA: Diagnosis not present

## 2022-01-16 DIAGNOSIS — E559 Vitamin D deficiency, unspecified: Secondary | ICD-10-CM

## 2022-01-16 DIAGNOSIS — R918 Other nonspecific abnormal finding of lung field: Secondary | ICD-10-CM | POA: Diagnosis not present

## 2022-01-16 DIAGNOSIS — E538 Deficiency of other specified B group vitamins: Secondary | ICD-10-CM

## 2022-01-16 DIAGNOSIS — D508 Other iron deficiency anemias: Secondary | ICD-10-CM | POA: Diagnosis not present

## 2022-01-16 LAB — CBC WITH DIFFERENTIAL/PLATELET
Abs Immature Granulocytes: 0.01 10*3/uL (ref 0.00–0.07)
Basophils Absolute: 0.1 10*3/uL (ref 0.0–0.1)
Basophils Relative: 1 %
Eosinophils Absolute: 0.2 10*3/uL (ref 0.0–0.5)
Eosinophils Relative: 2 %
HCT: 41.1 % (ref 36.0–46.0)
Hemoglobin: 14.1 g/dL (ref 12.0–15.0)
Immature Granulocytes: 0 %
Lymphocytes Relative: 37 %
Lymphs Abs: 2.8 10*3/uL (ref 0.7–4.0)
MCH: 30.1 pg (ref 26.0–34.0)
MCHC: 34.3 g/dL (ref 30.0–36.0)
MCV: 87.8 fL (ref 80.0–100.0)
Monocytes Absolute: 0.5 10*3/uL (ref 0.1–1.0)
Monocytes Relative: 6 %
Neutro Abs: 4.2 10*3/uL (ref 1.7–7.7)
Neutrophils Relative %: 54 %
Platelets: 180 10*3/uL (ref 150–400)
RBC: 4.68 MIL/uL (ref 3.87–5.11)
RDW: 12.3 % (ref 11.5–15.5)
WBC: 7.7 10*3/uL (ref 4.0–10.5)
nRBC: 0 % (ref 0.0–0.2)

## 2022-01-16 NOTE — Progress Notes (Signed)
Virtual Visit via Telephone Note [facility]  I connected with Christina Gonzales  on 01/16/2022 at 11:30 AM by telephone and verified that I am speaking with the correct person using two identifiers.  Location: Patient: Home Provider: Home office   I discussed the limitations, risks, security and privacy concerns of performing an evaluation and management service by telephone and the availability of in person appointments. I also discussed with the patient that there may be a patient responsible charge related to this service. The patient expressed understanding and agreed to proceed.  REASON FOR VISIT: Recurrent DVTs, B12 deficiency, vitamin D deficiency  CURRENT THERAPY: Eliquis  INTERVAL HISTORY: Christina Gonzales is contacted today for follow-up of her recurrent DVTs, B12 deficiency, and vitamin D deficiency.  She was last seen by NP Francene Finders on 10/05/2019, has been lost to follow-up for the past 2 years.  At today's visit, she reports feeling fairly well. She has not had any episodes of DVT or PE in the interim since her last visit.  She denies any current symptoms of DVT such as unilateral leg swelling, pain, and erythema.   No current symptoms of PE such as shortness of breath, dyspnea on exertion, chest pain, hemoptysis, and palpitations.   She remains on Eliquis twice daily.  She had an isolated episode of epistaxis last week that was associated with nasal congestion and head cold symptoms.  Otherwise, she denies any symptoms of bleeding such as rectal bleeding or melena.  She is currently taking vitamin B12 (unknown dose) once a week.  She is also taking vitamin D 50,000 units once a week.  She reports 85% energy and 100% appetite.    OBSERVATIONS/OBJECTIVE: Review of Systems  Constitutional:  Negative for chills, diaphoresis, fever, malaise/fatigue and weight loss.  HENT:  Positive for congestion, nosebleeds (x1, related to "head cold") and sore  throat ("head cold").   Respiratory:  Positive for cough (mild cough from "head cold"). Negative for shortness of breath.   Cardiovascular:  Negative for chest pain and palpitations.  Gastrointestinal:  Negative for abdominal pain, blood in stool, melena, nausea and vomiting.  Neurological:  Negative for dizziness and headaches.     PHYSICAL EXAM (per limitations of virtual telephone visit): The patient is alert and oriented x 3, exhibiting adequate mentation, good mood, and ability to speak in full sentences and execute sound judgement.   ASSESSMENT & PLAN: 1.   History of recurrent DVTs: - Patient had a previous DVT in the right leg in 2011.  She was treated with 4 to 6 months of anticoagulation with resolution of the clot. -She was then diagnosed in 2019 with a left lower leg extremity DVT on a Doppler that was done 07/31/2017 that showed extensive left lower extremity DVT extending from the left peroneal vein through the femoral popliteal system. -She denied any recent extensive travel.  She was not on any estrogen supplementation.  She denies a history of miscarriages.  She denies any recent surgeries.  She denies a family history of blood clots, heart attacks, or strokes. -It was deemed to be an unprovoked DVT. -Hypercoagulable workup NEGATIVE: Negative for factor V Leiden, PT gene mutation, beta-2 glycoprotein, and negative LA. - She has been on Eliquis since 2019 due to history of recurrent unprovoked - She remains on Eliquis to date, tolerating well without any major bleeding events.   - No current symptoms of acute DVT or PE.   - Most recent  labs (01/10/2022) show normal kidney function, negative D-dimer.  CBC normal. - PLAN: Continue Eliquis with periodic (annual) reassessment of ongoing risk/benefits of indefinite anticoagulation.  Patient is aware of alarm symptoms that would prompt immediate medical attention.  2.  Pulmonary nodule: - Patient had a CT of the chest done on 09/04/2017  which showed 2 new nodules in the LLL largest measuring 6 mm. - Patient had a repeat CT of the chest in December 2019 which showed nodules are stable at this time. - CT of chest on 02/16/2019 showed development of a new 3 mm right upper lobe pulmonary nodule.  Follow-up with noncontrast CT in 12 months.  6 mm left apical nodule remains stable from previous scans it is characterized as benign. - We will order her CT of her chest and January 2022 to follow-up with nodule - She denies any new cough, changes in breathing, hemoptysis, or unintentional weight loss.   - PLAN: Will schedule patient for noncontrast CT chest for nodule follow-up.  Will call patient with results.    3.  Vitamin B12 deficiency: - Vitamin B12 deficiency noted in 2021 with vitamin B12 level 123 - She is currently taking vitamin B12 (unknown dose) once a week - Most recent labs (01/10/2022): Vitamin B12 level at 155, MMA elevated at 525 - PLAN: Recommend vitamin B12 2000 mcg daily, since she previously had deficiency even while taking 1000 mcg daily. - Will recheck B12/MMA levels in 4 months.  If persistent deficiency, will consider switching to injections.  4.  Vitamin D deficiency: - Vitamin D deficiency noted in 2021 with low vitamin D at 19.99 - She is currently taking vitamin D 50,000 units weekly 50,000 units weekly - Most recent labs (01/10/2022) show high-normal vitamin D at 84.32 - PLAN: Recommend vitamin D 50,000 units DECREASED to every other week   PLAN SUMMARY: >> Labs in 4 months (CBC/D, B12, MMA, vitamin D, ferritin, iron/TIBC) >> OFFICE visit 1 week after labs    I discussed the assessment and treatment plan with the patient. The patient was provided an opportunity to ask questions and all were answered. The patient agreed with the plan and demonstrated an understanding of the instructions.   The patient was advised to call back or seek an in-person evaluation if the symptoms worsen or if the condition  fails to improve as anticipated.  I provided 23 minutes of non-face-to-face time during this encounter.   Harriett Rush, PA-C 01/16/2022 12:17 PM

## 2022-01-17 ENCOUNTER — Other Ambulatory Visit: Payer: Self-pay

## 2022-01-17 DIAGNOSIS — E559 Vitamin D deficiency, unspecified: Secondary | ICD-10-CM

## 2022-01-17 DIAGNOSIS — C9000 Multiple myeloma not having achieved remission: Secondary | ICD-10-CM

## 2022-01-17 DIAGNOSIS — D500 Iron deficiency anemia secondary to blood loss (chronic): Secondary | ICD-10-CM

## 2022-01-17 DIAGNOSIS — D508 Other iron deficiency anemias: Secondary | ICD-10-CM

## 2022-01-17 DIAGNOSIS — E538 Deficiency of other specified B group vitamins: Secondary | ICD-10-CM

## 2022-01-18 ENCOUNTER — Ambulatory Visit: Payer: Medicare HMO | Admitting: Family Medicine

## 2022-01-31 ENCOUNTER — Telehealth: Payer: Self-pay | Admitting: Family

## 2022-01-31 MED ORDER — DOXYCYCLINE HYCLATE 100 MG PO TABS: 100.0000 mg | ORAL_TABLET | Freq: Two times a day (BID) | ORAL | 0 refills | Status: DC

## 2022-01-31 NOTE — Telephone Encounter (Signed)
na

## 2022-01-31 NOTE — Telephone Encounter (Signed)
Doxycycline Prescription sent to pharmacy, this should help with cough and UTI symptoms.

## 2022-01-31 NOTE — Telephone Encounter (Signed)
Patient called stating that she recently had a video visit with PCP and was prescribed an antibiotic. Says she doesn't believe the medicine has helped at all. Also still feels like she has a UTI that was supposed to be addressed at video visit (see appt notes) but was not.  Please advise and call patient.

## 2022-02-06 ENCOUNTER — Ambulatory Visit (INDEPENDENT_AMBULATORY_CARE_PROVIDER_SITE_OTHER): Payer: Medicare HMO | Admitting: Nurse Practitioner

## 2022-02-06 ENCOUNTER — Encounter: Payer: Self-pay | Admitting: Nurse Practitioner

## 2022-02-06 ENCOUNTER — Ambulatory Visit (INDEPENDENT_AMBULATORY_CARE_PROVIDER_SITE_OTHER): Payer: Medicare HMO

## 2022-02-06 VITALS — BP 166/88 | HR 85 | Temp 98.7°F | Ht 62.0 in | Wt 174.2 lb

## 2022-02-06 DIAGNOSIS — R0602 Shortness of breath: Secondary | ICD-10-CM

## 2022-02-06 DIAGNOSIS — J069 Acute upper respiratory infection, unspecified: Secondary | ICD-10-CM

## 2022-02-06 MED ORDER — PREDNISONE 20 MG PO TABS: 20.0000 mg | ORAL_TABLET | Freq: Every day | ORAL | 0 refills | Status: DC

## 2022-02-06 MED ORDER — LEVOFLOXACIN 500 MG PO TABS: 500.0000 mg | ORAL_TABLET | Freq: Every day | ORAL | 0 refills | Status: DC

## 2022-02-06 NOTE — Patient Instructions (Signed)

## 2022-02-06 NOTE — Progress Notes (Unsigned)
Acute Office Visit  Subjective:     Patient ID: Christina Gonzales, female    DOB: September 17, 1945, 77 y.o.   MRN: [id number]  No chief complaint on file.   URI  This is a new problem. The current episode started in the past 7 days. The problem has been unchanged. There has been no fever. Associated symptoms include congestion and coughing. Pertinent negatives include no chest pain, headaches, rash, sinus pain, sore throat or wheezing. She has tried nothing for the symptoms.  Shortness of Breath This is a new problem. The current episode started yesterday. The problem occurs intermittently. The problem has been unchanged. Pertinent negatives include no chest pain, fever, headaches, leg swelling, rash, sore throat or wheezing. Nothing aggravates the symptoms. She has tried prescription cough suppressants for the symptoms. The treatment provided mild relief.     Review of Systems  Constitutional: Negative.  Negative for chills and fever.  HENT:  Positive for congestion. Negative for sinus pain and sore throat.   Respiratory:  Positive for cough and shortness of breath. Negative for wheezing.   Cardiovascular:  Negative for chest pain and leg swelling.  Genitourinary: Negative.   Skin: Negative.  Negative for itching and rash.  Neurological:  Negative for headaches.  All other systems reviewed and are negative.       Objective:    BP (!) 166/88   Pulse 85   Temp 98.7 F (37.1 C)   Ht '5\' 2"'$  (1.575 m)   Wt 174 lb 3.2 oz (79 kg)   SpO2 97%   BMI 31.86 kg/m  BP Readings from Last 3 Encounters:  02/06/22 (!) 166/88  01/16/22 (!) 164/73  11/13/21 134/71   Wt Readings from Last 3 Encounters:  02/06/22 174 lb 3.2 oz (79 kg)  01/16/22 176 lb 6.4 oz (80 kg)  11/13/21 175 lb 3.2 oz (79.5 kg)      Physical Exam Vitals and nursing note reviewed.  Constitutional:      Appearance: Normal appearance.  HENT:     Head: Normocephalic.     Right Ear: External ear normal.      Left Ear: External ear normal.     Nose: Congestion present.  Eyes:     Conjunctiva/sclera: Conjunctivae normal.  Cardiovascular:     Rate and Rhythm: Normal rate and regular rhythm.     Pulses: Normal pulses.     Heart sounds: Normal heart sounds.  Pulmonary:     Effort: Pulmonary effort is normal.     Breath sounds: Normal breath sounds.  Abdominal:     General: Bowel sounds are normal.  Skin:    Findings: No erythema or rash.  Neurological:     Mental Status: She is alert and oriented to person, place, and time.     No results found for any visits on 02/06/22.      Assessment & Plan:  Patient presents with ongoing cough and congestion in the past 7 days.  She was seen in clinic 01/31/2022 and started on doxycycline.  As of today patient has only taken 5 days worth of medication and return because she said she did not see any improvement.  I provided education to patient to complete antibiotic as prescribed and if symptoms are not improving to call back.  Assessment completed patient is not increasing or short of breath.  Advised to continue Flonase, Mucinex for cough and congestion.  Prednisone 20 mg tablet by mouth daily for 6 days. Completed  chest x-ray results pending. Problem List Items Addressed This Visit   None Visit Diagnoses     Upper respiratory tract infection, unspecified type    -  Primary   Relevant Medications   sodium chloride (OCEAN) 0.65 % SOLN nasal spray   SOB (shortness of breath)       Relevant Medications   predniSONE (DELTASONE) 20 MG tablet   Other Relevant Orders   DG Chest 2 View (Completed)       Meds ordered this encounter  Medications   predniSONE (DELTASONE) 20 MG tablet    Sig: Take 1 tablet (20 mg total) by mouth daily with breakfast.    Dispense:  6 tablet    Refill:  0    Order Specific Question:   Supervising Provider    Answer:   Claretta Fraise [[id number]]   DISCONTD: levofloxacin (LEVAQUIN) 500 MG tablet    Sig: Take 1 tablet  (500 mg total) by mouth daily.    Dispense:  5 tablet    Refill:  0    Order Specific Question:   Supervising Provider    Answer:   Jeneen Rinks   sodium chloride (OCEAN) 0.65 % SOLN nasal spray    Sig: Place 1 spray into both nostrils as needed for congestion.    Dispense:  30 mL    Refill:  5    Order Specific Question:   Supervising Provider    Answer:   Claretta Fraise [[id number]]    Return if symptoms worsen or fail to improve.  Ivy Lynn, NP

## 2022-02-07 ENCOUNTER — Other Ambulatory Visit (HOSPITAL_COMMUNITY): Payer: Self-pay | Admitting: Hematology

## 2022-02-07 DIAGNOSIS — Z86718 Personal history of other venous thrombosis and embolism: Secondary | ICD-10-CM

## 2022-02-07 MED ORDER — SALINE SPRAY 0.65 % NA SOLN: 1.0000 | NASAL | 5 refills | Status: DC | PRN

## 2022-03-10 ENCOUNTER — Other Ambulatory Visit: Payer: Self-pay | Admitting: Physician Assistant

## 2022-03-10 DIAGNOSIS — Z86718 Personal history of other venous thrombosis and embolism: Secondary | ICD-10-CM

## 2022-03-12 ENCOUNTER — Other Ambulatory Visit: Payer: Self-pay | Admitting: *Deleted

## 2022-03-12 DIAGNOSIS — Z86718 Personal history of other venous thrombosis and embolism: Secondary | ICD-10-CM

## 2022-03-12 MED ORDER — APIXABAN 5 MG PO TABS: ORAL_TABLET | ORAL | 3 refills | Status: DC

## 2022-03-20 ENCOUNTER — Telehealth: Payer: Self-pay | Admitting: Family

## 2022-03-20 NOTE — Telephone Encounter (Signed)
Telephone not working unable to leave a message for patient to call back and schedule Medicare Annual Wellness Visit (AWV) to be completed by video or phone.   Last AWV: 08/10/2020   Please schedule at anytime with [place]     Any questions, please contact me at [phone]   Thank you,   [facility] Clinical Support for [place] [facility] ??[id number] or ??[id number]

## 2022-04-09 ENCOUNTER — Encounter: Payer: Self-pay | Admitting: Family

## 2022-04-09 ENCOUNTER — Ambulatory Visit (INDEPENDENT_AMBULATORY_CARE_PROVIDER_SITE_OTHER): Payer: Medicare HMO | Admitting: Family

## 2022-04-09 VITALS — BP 125/71 | HR 85 | Temp 97.6°F | Ht 62.0 in | Wt 170.0 lb

## 2022-04-09 DIAGNOSIS — Z86718 Personal history of other venous thrombosis and embolism: Secondary | ICD-10-CM

## 2022-04-09 DIAGNOSIS — I10 Essential (primary) hypertension: Secondary | ICD-10-CM

## 2022-04-09 DIAGNOSIS — F320 Major depressive disorder, single episode, mild: Secondary | ICD-10-CM

## 2022-04-09 DIAGNOSIS — Z79899 Other long term (current) drug therapy: Secondary | ICD-10-CM

## 2022-04-09 DIAGNOSIS — E785 Hyperlipidemia, unspecified: Secondary | ICD-10-CM

## 2022-04-09 DIAGNOSIS — F411 Generalized anxiety disorder: Secondary | ICD-10-CM

## 2022-04-09 DIAGNOSIS — R300 Dysuria: Secondary | ICD-10-CM | POA: Diagnosis not present

## 2022-04-09 DIAGNOSIS — Z6831 Body mass index (BMI) 31.0-31.9, adult: Secondary | ICD-10-CM | POA: Diagnosis not present

## 2022-04-09 DIAGNOSIS — R569 Unspecified convulsions: Secondary | ICD-10-CM | POA: Diagnosis not present

## 2022-04-09 DIAGNOSIS — B3731 Acute candidiasis of vulva and vagina: Secondary | ICD-10-CM | POA: Diagnosis not present

## 2022-04-09 DIAGNOSIS — F5101 Primary insomnia: Secondary | ICD-10-CM | POA: Diagnosis not present

## 2022-04-09 DIAGNOSIS — E669 Obesity, unspecified: Secondary | ICD-10-CM | POA: Diagnosis not present

## 2022-04-09 DIAGNOSIS — R69 Illness, unspecified: Secondary | ICD-10-CM | POA: Diagnosis not present

## 2022-04-09 LAB — MICROSCOPIC EXAMINATION
RBC, Urine: NONE SEEN /HPF (ref 0–2)
Renal Epithel, UA: NONE SEEN /HPF

## 2022-04-09 LAB — URINALYSIS, COMPLETE
Bilirubin, UA: NEGATIVE
Ketones, UA: NEGATIVE
Leukocytes,UA: NEGATIVE
Nitrite, UA: NEGATIVE
Protein,UA: NEGATIVE
Specific Gravity, UA: <1.005 — ABNORMAL LOW (ref 1.005–1.030)
Urobilinogen, Ur: 0.2 mg/dL (ref 0.2–1.0)
pH, UA: 5.5 (ref 5.0–7.5)

## 2022-04-09 MED ORDER — ATORVASTATIN CALCIUM 20 MG PO TABS: 20.0000 mg | ORAL_TABLET | Freq: Every day | ORAL | 1 refills | Status: DC

## 2022-04-09 MED ORDER — AMLODIPINE BESYLATE 5 MG PO TABS: 5.0000 mg | ORAL_TABLET | Freq: Every day | ORAL | 1 refills | Status: DC

## 2022-04-09 MED ORDER — LISINOPRIL 40 MG PO TABS: 40.0000 mg | ORAL_TABLET | Freq: Every day | ORAL | 1 refills | Status: DC

## 2022-04-09 MED ORDER — FLUCONAZOLE 150 MG PO TABS: 150.0000 mg | ORAL_TABLET | ORAL | 0 refills | Status: DC | PRN

## 2022-04-09 MED ORDER — PHENAZOPYRIDINE HCL 95 MG PO TABS: 95.0000 mg | ORAL_TABLET | Freq: Three times a day (TID) | ORAL | 0 refills | Status: DC | PRN

## 2022-04-09 MED ORDER — ZOLPIDEM TARTRATE 5 MG PO TABS: 5.0000 mg | ORAL_TABLET | Freq: Every evening | ORAL | 2 refills | Status: DC | PRN

## 2022-04-09 NOTE — Patient Instructions (Signed)

## 2022-04-09 NOTE — Progress Notes (Signed)
Subjective:    Patient ID: Christina Gonzales, female    DOB: 1945-06-20, 77 y.o.   MRN: [id number]  Chief Complaint  Patient presents with   Urinary Tract Infection   Medical Management of Chronic Issues   Pt presents to the office today with chronic follow up and joint pain. She is followed by Neurologists for seizures.     She takes Eliquis 5 mg for hx of DVT.  Dysuria  This is a new problem. The current episode started 1 to 4 weeks ago. The problem occurs intermittently. The problem has been waxing and waning. The quality of the pain is described as burning. The pain is mild. Associated symptoms include urgency. Pertinent negatives include no frequency, nausea or vomiting. She has tried increased fluids for the symptoms. The treatment provided mild relief.  Hypertension This is a chronic problem. The current episode started more than 1 year ago. The problem has been resolved since onset. The problem is controlled. Associated symptoms include anxiety. Pertinent negatives include no malaise/fatigue, peripheral edema or shortness of breath. Risk factors for coronary artery disease include dyslipidemia and sedentary lifestyle. The current treatment provides moderate improvement.  Insomnia Primary symptoms: difficulty falling asleep, frequent awakening, no malaise/fatigue.   The current episode started more than one year. The onset quality is gradual. The problem occurs intermittently. Past treatments include medication. The treatment provided moderate relief. PMH includes: depression.   Hyperlipidemia This is a chronic problem. The current episode started more than 1 year ago. The problem is controlled. Recent lipid tests were reviewed and are normal. Exacerbating diseases include obesity. Pertinent negatives include no shortness of breath. Current antihyperlipidemic treatment includes statins. The current treatment provides moderate improvement of lipids. Risk factors for coronary artery  disease include dyslipidemia, hypertension, a sedentary lifestyle and post-menopausal.  Anxiety Presents for follow-up visit. Symptoms include excessive worry, insomnia and nervous/anxious behavior. Patient reports no irritability, nausea or shortness of breath. Symptoms occur occasionally. The severity of symptoms is mild.    Depression        This is a chronic problem.  The current episode started more than 1 year ago.   The onset quality is gradual.   The problem occurs intermittently.  Associated symptoms include insomnia.  Associated symptoms include no helplessness, no hopelessness and not sad.  Past treatments include nothing.  Past medical history includes anxiety.       Review of Systems  Constitutional:  Negative for irritability and malaise/fatigue.  Respiratory:  Negative for shortness of breath.   Gastrointestinal:  Negative for nausea and vomiting.  Genitourinary:  Positive for dysuria and urgency. Negative for frequency.  Psychiatric/Behavioral:  Positive for depression. The patient is nervous/anxious and has insomnia.   All other systems reviewed and are negative.      Objective:   Physical Exam Vitals reviewed.  Constitutional:      General: She is not in acute distress.    Appearance: She is well-developed. She is obese.  HENT:     Head: Normocephalic and atraumatic.     Right Ear: Tympanic membrane normal.     Left Ear: Tympanic membrane normal.  Eyes:     Pupils: Pupils are equal, round, and reactive to light.  Neck:     Thyroid: No thyromegaly.  Cardiovascular:     Rate and Rhythm: Normal rate and regular rhythm.     Heart sounds: Normal heart sounds. No murmur heard. Pulmonary:     Effort: Pulmonary effort is normal.  No respiratory distress.     Breath sounds: Normal breath sounds. No wheezing.  Abdominal:     General: Bowel sounds are normal. There is no distension.     Palpations: Abdomen is soft.     Tenderness: There is no abdominal tenderness.   Musculoskeletal:        General: No tenderness. Normal range of motion.     Cervical back: Normal range of motion and neck supple.  Skin:    General: Skin is warm and dry.  Neurological:     Mental Status: She is alert and oriented to person, place, and time.     Cranial Nerves: No cranial nerve deficit.     Deep Tendon Reflexes: Reflexes are normal and symmetric.  Psychiatric:        Behavior: Behavior normal.        Thought Content: Thought content normal.        Judgment: Judgment normal.        BP 125/71   Pulse 85   Temp 97.6 F (36.4 C) (Temporal)   Ht '5\' 2"'$  (1.575 m)   Wt 170 lb (77.1 kg)   SpO2 95%   BMI 31.09 kg/m      Assessment & Plan:  Christina Gonzales comes in today with chief complaint of Urinary Tract Infection and Medical Management of Chronic Issues   Diagnosis and orders addressed:  1. Dysuria - Urinalysis, Complete - Urine Culture - phenazopyridine (PYRIDIUM) 95 MG tablet; Take 1 tablet (95 mg total) by mouth 3 (three) times daily as needed for pain.  Dispense: 10 tablet; Refill: 0 - CMP14+EGFR  2. Essential hypertension, benign - amLODipine (NORVASC) 5 MG tablet; Take 1 tablet (5 mg total) by mouth daily.  Dispense: 90 tablet; Refill: 1 - lisinopril (ZESTRIL) 40 MG tablet; Take 1 tablet (40 mg total) by mouth daily.  Dispense: 90 tablet; Refill: 1 - CMP14+EGFR  3. Hyperlipidemia, unspecified hyperlipidemia type - atorvastatin (LIPITOR) 20 MG tablet; Take 1 tablet (20 mg total) by mouth daily.  Dispense: 90 tablet; Refill: 1 - CMP14+EGFR  4. Primary insomnia - zolpidem (AMBIEN) 5 MG tablet; Take 1 tablet (5 mg total) by mouth at bedtime as needed for sleep.  Dispense: 30 tablet; Refill: 2 - CMP14+EGFR  5. Seizures (HCC) - CMP14+EGFR  6. GAD (generalized anxiety disorder) - CMP14+EGFR  7. Obesity (BMI 30-39.9) - CMP14+EGFR  8. Current mild episode of major depressive disorder without prior episode (HCC) - CMP14+EGFR  9.  History of deep vein thrombosis (DVT) of lower extremity - CMP14+EGFR  10. Vaginal candidiasis Start diflucan  Avoid itching  - CMP14+EGFR - fluconazole (DIFLUCAN) 150 MG tablet; Take 1 tablet (150 mg total) by mouth every three (3) days as needed.  Dispense: 3 tablet; Refill: 0  11. Controlled substance agreement signed - zolpidem (AMBIEN) 5 MG tablet; Take 1 tablet (5 mg total) by mouth at bedtime as needed for sleep.  Dispense: 30 tablet; Refill: 2 - CMP14+EGFR   Labs pending Patient reviewed in  controlled database, no flags noted. Contract and drug screen are up to date.  Health Maintenance reviewed Diet and exercise encouraged  Follow up plan: 3 months    [name], FNP

## 2022-04-10 ENCOUNTER — Other Ambulatory Visit: Payer: Self-pay | Admitting: Family

## 2022-04-10 ENCOUNTER — Other Ambulatory Visit: Payer: Medicare HMO

## 2022-04-10 ENCOUNTER — Telehealth: Payer: Self-pay

## 2022-04-10 ENCOUNTER — Other Ambulatory Visit: Payer: Self-pay | Admitting: Family Medicine

## 2022-04-10 DIAGNOSIS — E119 Type 2 diabetes mellitus without complications: Secondary | ICD-10-CM

## 2022-04-10 LAB — CBC WITH DIFFERENTIAL/PLATELET
Basophils Absolute: 0 10*3/uL (ref 0.0–0.2)
Basos: 1 %
EOS (ABSOLUTE): 0.3 10*3/uL (ref 0.0–0.4)
Eos: 3 %
Hematocrit: 37.3 % (ref 34.0–46.6)
Hemoglobin: 12.6 g/dL (ref 11.1–15.9)
Immature Grans (Abs): 0 10*3/uL (ref 0.0–0.1)
Immature Granulocytes: 0 %
Lymphocytes Absolute: 2.2 10*3/uL (ref 0.7–3.1)
Lymphs: 27 %
MCH: 29.3 pg (ref 26.6–33.0)
MCHC: 33.8 g/dL (ref 31.5–35.7)
MCV: 87 fL (ref 79–97)
Monocytes Absolute: 0.6 10*3/uL (ref 0.1–0.9)
Monocytes: 8 %
Neutrophils Absolute: 5.1 10*3/uL (ref 1.4–7.0)
Neutrophils: 61 %
Platelets: 215 10*3/uL (ref 150–450)
RBC: 4.3 x10E6/uL (ref 3.77–5.28)
RDW: 11.8 % (ref 11.7–15.4)
WBC: 8.3 10*3/uL (ref 3.4–10.8)

## 2022-04-10 LAB — BAYER DCA HB A1C WAIVED: HB A1C (BAYER DCA - WAIVED): 10.6 % — ABNORMAL HIGH (ref 4.8–5.6)

## 2022-04-10 LAB — CMP14+EGFR
ALT: 40 IU/L — ABNORMAL HIGH (ref 0–32)
AST: 31 IU/L (ref 0–40)
Albumin/Globulin Ratio: 1.4 (ref 1.2–2.2)
Albumin: 3.8 g/dL (ref 3.8–4.8)
Alkaline Phosphatase: 40 IU/L — ABNORMAL LOW (ref 44–121)
BUN/Creatinine Ratio: 19 (ref 12–28)
BUN: 14 mg/dL (ref 8–27)
Bilirubin Total: 0.6 mg/dL (ref 0.0–1.2)
CO2: 21 mmol/L (ref 20–29)
Calcium: 9.3 mg/dL (ref 8.7–10.3)
Chloride: 98 mmol/L (ref 96–106)
Creatinine, Ser: 0.73 mg/dL (ref 0.57–1.00)
Globulin, Total: 2.7 g/dL (ref 1.5–4.5)
Glucose: 451 mg/dL (ref 70–99)
Potassium: 4.8 mmol/L (ref 3.5–5.2)
Sodium: 134 mmol/L (ref 134–144)
Total Protein: 6.5 g/dL (ref 6.0–8.5)
eGFR: 85 mL/min/{1.73_m2} (ref >59)

## 2022-04-10 LAB — URINE CULTURE

## 2022-04-10 MED ORDER — METFORMIN HCL ER 750 MG PO TB24: 750.0000 mg | ORAL_TABLET | Freq: Every day | ORAL | 1 refills | Status: DC

## 2022-04-10 MED ORDER — BLOOD GLUCOSE MONITORING SUPPL DEVI: 1.0000 | Freq: Three times a day (TID) | 0 refills | Status: AC

## 2022-04-10 MED ORDER — LANCET DEVICE MISC: 1.0000 | Freq: Three times a day (TID) | 0 refills | Status: AC

## 2022-04-10 MED ORDER — BLOOD GLUCOSE TEST VI STRP: 1.0000 | ORAL_STRIP | Freq: Three times a day (TID) | 0 refills | Status: AC

## 2022-04-10 MED ORDER — LANCETS MISC. MISC: 1.0000 | Freq: Three times a day (TID) | 0 refills | Status: AC

## 2022-04-10 NOTE — Progress Notes (Signed)
  Care Management   Outreach Note  04/10/2022 Name: Christina Gonzales MRN: [id number] DOB: 05/31/1945  An unsuccessful telephone outreach was attempted today to contact the patient about Chronic Care Management needs.    Follow Up Plan:  A HIPAA compliant phone message was left for the patient providing contact information and requesting a return call.  The care management team will reach out to the patient again over the next 7 days.  If patient returns call to provider office, please advise to call [facility] * at [phone] Larsson, [place], [place] [address] Direct Dial: [phone] [email or web address]

## 2022-04-15 NOTE — Progress Notes (Signed)
  Care Management   Outreach Note  04/15/2022 Name: Christina Gonzales MRN: [id number] DOB: 1945/09/29  Second unsuccessful telephone outreach was attempted today to contact the patient about Chronic Care Management needs.    Follow Up Plan:  A HIPAA compliant phone message was left for the patient providing contact information and requesting a return call.  The care management team will reach out to the patient again over the next 7 days.  If patient returns call to provider office, please advise to call [facility] * at [phone] Larsson, [place], [place] [address] Direct Dial: [phone] [email or web address]

## 2022-04-16 ENCOUNTER — Telehealth: Payer: Self-pay | Admitting: Family Medicine

## 2022-04-17 MED ORDER — DAPAGLIFLOZIN PROPANEDIOL 10 MG PO TABS: 10.0000 mg | ORAL_TABLET | Freq: Every day | ORAL | 1 refills | Status: DC

## 2022-04-17 NOTE — Telephone Encounter (Signed)
Patient aware and verbalized understanding. °

## 2022-04-17 NOTE — Telephone Encounter (Signed)
Urine culture was negative. Her symptoms are more than likely coming from her uncontrolled diabetes.  Farxiga 10 mg Prescription sent to pharmacy.

## 2022-04-19 NOTE — Progress Notes (Signed)
  Care Management   Outreach Note  04/19/2022 Name: Christina Gonzales MRN: [id number] DOB: 10-Jan-1946  Third unsuccessful telephone outreach was attempted today to contact the patient about Chronic Care Management needs.The patient was referred to the case management team by the provider who initiated CCM services. The provider has been notified of our unsuccessful attempts to make or maintain contact with the patient.   Follow Up Plan:  We have been unable to make contact with the patient for follow up. The care management team is available to follow up with the patient after provider conversation with the patient regarding recommendation for care management engagement and subsequent re-referral to the care management team.   Noreene Larsson, [place], [place] [address] Direct Dial: [phone] [email or web address]

## 2022-04-23 ENCOUNTER — Telehealth (INDEPENDENT_AMBULATORY_CARE_PROVIDER_SITE_OTHER): Payer: Medicare HMO | Admitting: Family

## 2022-04-23 ENCOUNTER — Encounter: Payer: Self-pay | Admitting: Family

## 2022-04-23 ENCOUNTER — Telehealth: Payer: Self-pay | Admitting: Family

## 2022-04-23 DIAGNOSIS — E1169 Type 2 diabetes mellitus with other specified complication: Secondary | ICD-10-CM

## 2022-04-23 DIAGNOSIS — R399 Unspecified symptoms and signs involving the genitourinary system: Secondary | ICD-10-CM

## 2022-04-23 DIAGNOSIS — I10 Essential (primary) hypertension: Secondary | ICD-10-CM | POA: Diagnosis not present

## 2022-04-23 DIAGNOSIS — E785 Hyperlipidemia, unspecified: Secondary | ICD-10-CM | POA: Diagnosis not present

## 2022-04-23 MED ORDER — RYBELSUS 3 MG PO TABS: 3.0000 mg | ORAL_TABLET | Freq: Every day | ORAL | 1 refills | Status: DC

## 2022-04-23 MED ORDER — CEPHALEXIN 500 MG PO CAPS: 500.0000 mg | ORAL_CAPSULE | Freq: Two times a day (BID) | ORAL | 0 refills | Status: DC

## 2022-04-23 MED ORDER — DOXYCYCLINE HYCLATE 100 MG PO TABS: 100.0000 mg | ORAL_TABLET | Freq: Two times a day (BID) | ORAL | 0 refills | Status: DC

## 2022-04-23 NOTE — Progress Notes (Signed)
Virtual Visit Consent   Christina Gonzales, you are scheduled for a virtual visit with a [facility] provider today. Just as with appointments in the office, your consent must be obtained to participate. Your consent will be active for this visit and any virtual visit you may have with one of our providers in the next 365 days. If you have a MyChart account, a copy of this consent can be sent to you electronically.  As this is a virtual visit, video technology does not allow for your provider to perform a traditional examination. This may limit your provider's ability to fully assess your condition. If your provider identifies any concerns that need to be evaluated in person or the need to arrange testing (such as labs, EKG, etc.), we will make arrangements to do so. Although advances in technology are sophisticated, we cannot ensure that it will always work on either your end or our end. If the connection with a video visit is poor, the visit may have to be switched to a telephone visit. With either a video or telephone visit, we are not always able to ensure that we have a secure connection.  By engaging in this virtual visit, you consent to the provision of healthcare and authorize for your insurance to be billed (if applicable) for the services provided during this visit. Depending on your insurance coverage, you may receive a charge related to this service.  I need to obtain your verbal consent now. Are you willing to proceed with your visit today? Christina Gonzales has provided verbal consent on 04/23/2022 for a virtual visit (video or telephone). [name], FNP  Date: 04/23/2022 9:25 AM  Virtual Visit via Video Note   I, [name], connected with  Christina Gonzales  ([id number], April 18, 1945) on 04/23/22 at  8:35 AM EDT by a video-enabled telemedicine application and verified that I am speaking with the correct person using two identifiers.  Location: Patient: Virtual Visit  Location Patient: Home Provider: Virtual Visit Location Provider: Home Office   I discussed the limitations of evaluation and management by telemedicine and the availability of in person appointments. The patient expressed understanding and agreed to proceed.    History of Present Illness: Christina Gonzales is a 78 y.o. who identifies as a female who was assigned female at birth, and is being seen today for diabetic follow up. She was seen on 04/10/22 and her A1C was 10.6. She was placed on Farxiga 10 mg.  HPI: Diabetes She presents for her follow-up diabetic visit. She has type 2 diabetes mellitus. Pertinent negatives for diabetes include no blurred vision and no foot paresthesias. Symptoms are worsening. Risk factors for coronary artery disease include dyslipidemia, diabetes mellitus, hypertension and sedentary lifestyle. She is following a generally healthy diet. Her overall blood glucose range is 140-180 mg/dl.  Hypertension This is a chronic problem. The current episode started more than 1 year ago. The problem has been resolved since onset. The problem is controlled. Pertinent negatives include no blurred vision, malaise/fatigue, peripheral edema or shortness of breath.  Dysuria  This is a new problem. The current episode started 1 to 4 weeks ago. The problem occurs intermittently. The problem has been waxing and waning. The quality of the pain is described as burning. Associated symptoms include urgency. Pertinent negatives include no frequency, hematuria, nausea or vomiting.  Hyperlipidemia This is a chronic problem. The current episode started more than 1 year ago. Pertinent negatives include no shortness of breath. Current  antihyperlipidemic treatment includes statins. The current treatment provides moderate improvement of lipids. Risk factors for coronary artery disease include diabetes mellitus, dyslipidemia, hypertension and a sedentary lifestyle.    Problems:  Patient Active  Problem List   Diagnosis Date Noted   Primary insomnia 05/31/2021   History of deep vein thrombosis (DVT) of lower extremity 06/11/2018   Current mild episode of major depressive disorder without prior episode ([place]) 03/03/2018   Grief 03/03/2018   Seborrheic keratosis 02/26/2017   Obesity (BMI 30-39.9) 05/10/2016   GAD (generalized anxiety disorder) 11/14/2014   Hyperlipidemia associated with type 2 diabetes mellitus ([place]) 09/01/2013   Essential hypertension, benign 09/01/2013   Seizures ([place]) 09/01/2013   Meningioma ([place]) 02/27/2012    Allergies:  Allergies  Allergen Reactions   Heparin Nausea And Vomiting   Warfarin Other (See Comments)    unknown   Penicillins Rash   Medications:  Current Outpatient Medications:    cephALEXin (KEFLEX) 500 MG capsule, Take 1 capsule (500 mg total) by mouth 2 (two) times daily., Disp: 14 capsule, Rfl: 0   Semaglutide (RYBELSUS) 3 MG TABS, Take 1 tablet (3 mg total) by mouth daily., Disp: 90 tablet, Rfl: 1   amLODipine (NORVASC) 5 MG tablet, Take 1 tablet (5 mg total) by mouth daily., Disp: 90 tablet, Rfl: 1   apixaban (ELIQUIS) 5 MG TABS tablet, TAKE 1 TABLET BY MOUTH TWICE DAILY . APPOINTMENT REQUIRED FOR FUTURE REFILLS, Disp: 60 tablet, Rfl: 3   atorvastatin (LIPITOR) 20 MG tablet, Take 1 tablet (20 mg total) by mouth daily., Disp: 90 tablet, Rfl: 1   blood glucose meter kit and supplies, Dispense based on patient and insurance preference. Use up to four times daily as directed. (FOR ICD-10 E10.9, E11.9)., Disp: 1 each, Rfl: 0   Blood Glucose Monitoring Suppl DEVI, 1 each by Does not apply route in the morning, at noon, and at bedtime. May substitute to any manufacturer covered by patient's insurance., Disp: 1 each, Rfl: 0   dapagliflozin propanediol (FARXIGA) 10 MG TABS tablet, Take 1 tablet (10 mg total) by mouth daily before breakfast., Disp: 90 tablet, Rfl: 1   fluticasone (FLONASE) 50 MCG/ACT nasal spray, Place 2 sprays into both nostrils  daily., Disp: 16 g, Rfl: 6   Glucose Blood (BLOOD GLUCOSE TEST STRIPS) STRP, 1 each by In Vitro route in the morning, at noon, and at bedtime. May substitute to any manufacturer covered by patient's insurance., Disp: 100 strip, Rfl: 0   Lancet Device MISC, 1 each by Does not apply route in the morning, at noon, and at bedtime. May substitute to any manufacturer covered by patient's insurance., Disp: 1 each, Rfl: 0   Lancets (ONETOUCH DELICA PLUS 123XX123) MISC, Check twice a day and as needed, Disp: 100 each, Rfl: 6   Lancets Misc. MISC, 1 each by Does not apply route in the morning, at noon, and at bedtime. May substitute to any manufacturer covered by patient's insurance., Disp: 100 each, Rfl: 0   levETIRAcetam (KEPPRA) 500 MG tablet, Take by mouth., Disp: , Rfl:    lisinopril (ZESTRIL) 40 MG tablet, Take 1 tablet (40 mg total) by mouth daily., Disp: 90 tablet, Rfl: 1   Multiple Vitamins tablet, Take 1 tablet by mouth. Reported on 05/26/2015, Disp: , Rfl:    ONETOUCH ULTRA test strip, 1 each by Other route 4 (four) times daily., Disp: 100 each, Rfl: 6   OVER THE COUNTER MEDICATION, Tumeric for inflammation., Disp: , Rfl:    phenazopyridine (PYRIDIUM)  95 MG tablet, Take 1 tablet (95 mg total) by mouth 3 (three) times daily as needed for pain., Disp: 10 tablet, Rfl: 0   sodium chloride (OCEAN) 0.65 % SOLN nasal spray, Place 1 spray into both nostrils as needed for congestion., Disp: 30 mL, Rfl: 5   Vitamin D, Ergocalciferol, (DRISDOL) 1.25 MG (50000 UNIT) CAPS capsule, Take 1 capsule (50,000 Units total) by mouth once a week., Disp: 16 capsule, Rfl: 2   zolpidem (AMBIEN) 5 MG tablet, Take 1 tablet (5 mg total) by mouth at bedtime as needed for sleep., Disp: 30 tablet, Rfl: 2  Observations/Objective: Patient is well-developed, well-nourished in no acute distress.  Resting comfortably  at home.  Head is normocephalic, atraumatic.  No labored breathing.  Speech is clear and coherent with logical  content.  Patient is alert and oriented at baseline.    Assessment and Plan: 1. Type 2 diabetes mellitus with other specified complication, without long-term current use of insulin (HCC) - Semaglutide (RYBELSUS) 3 MG TABS; Take 1 tablet (3 mg total) by mouth daily.  Dispense: 90 tablet; Refill: 1  2. Essential hypertension, benign  3. Hyperlipidemia associated with type 2 diabetes mellitus ([place])  4. UTI symptoms - cephALEXin (KEFLEX) 500 MG capsule; Take 1 capsule (500 mg total) by mouth 2 (two) times daily.  Dispense: 14 capsule; Refill: 0  Continue Farxiga 10 mg  Start Rybelsus 3 mg  Strict low carb diet  Will give Keflex for UTI Force fluids AZO over the counter X2 days Follow up if symptoms worsen or do not improve   Follow Up Instructions: I discussed the assessment and treatment plan with the patient. The patient was provided an opportunity to ask questions and all were answered. The patient agreed with the plan and demonstrated an understanding of the instructions.  A copy of instructions were sent to the patient via MyChart unless otherwise noted below.     The patient was advised to call back or seek an in-person evaluation if the symptoms worsen or if the condition fails to improve as anticipated.  Time:  I spent 13 minutes with the patient via telehealth technology discussing the above problems/concerns.    [name], FNP

## 2022-04-23 NOTE — Telephone Encounter (Signed)
Left detailed message on pharmacy VM.

## 2022-04-23 NOTE — Telephone Encounter (Signed)
Will change Keflex to doxycycline

## 2022-05-07 ENCOUNTER — Ambulatory Visit: Payer: Medicare HMO | Admitting: Family

## 2022-05-15 ENCOUNTER — Telehealth: Payer: Self-pay | Admitting: Family

## 2022-05-15 NOTE — Telephone Encounter (Signed)
Possibly it is not a UTI, she might have a yeast infection, that frequently associated with [place] and diabetes.  Likely needs to be seen but if she cannot get an appointment she could try some over-the-counter Monistat

## 2022-05-15 NOTE — Telephone Encounter (Signed)
Pt made aware of Dr. Darrol Poke recommendation. She will try OTC Monistat for now and call the office back for an appt if needed.

## 2022-05-15 NOTE — Telephone Encounter (Signed)
Pt thinks that while taking dapagliflozin propanediol (FARXIGA) 10 MG TABS tablet and Semaglutide (RYBELSUS) 3 MG TABS, not helping her heal from UTI. She still has itching and burning. Please call back

## 2022-05-16 ENCOUNTER — Inpatient Hospital Stay: Payer: Medicare HMO | Attending: Hematology

## 2022-05-16 DIAGNOSIS — E559 Vitamin D deficiency, unspecified: Secondary | ICD-10-CM | POA: Diagnosis not present

## 2022-05-16 DIAGNOSIS — Z7901 Long term (current) use of anticoagulants: Secondary | ICD-10-CM | POA: Insufficient documentation

## 2022-05-16 DIAGNOSIS — Z86718 Personal history of other venous thrombosis and embolism: Secondary | ICD-10-CM | POA: Insufficient documentation

## 2022-05-16 DIAGNOSIS — Z79899 Other long term (current) drug therapy: Secondary | ICD-10-CM | POA: Insufficient documentation

## 2022-05-16 DIAGNOSIS — E538 Deficiency of other specified B group vitamins: Secondary | ICD-10-CM | POA: Insufficient documentation

## 2022-05-16 DIAGNOSIS — R5383 Other fatigue: Secondary | ICD-10-CM | POA: Insufficient documentation

## 2022-05-16 DIAGNOSIS — Z801 Family history of malignant neoplasm of trachea, bronchus and lung: Secondary | ICD-10-CM | POA: Diagnosis not present

## 2022-05-16 DIAGNOSIS — R7989 Other specified abnormal findings of blood chemistry: Secondary | ICD-10-CM | POA: Diagnosis not present

## 2022-05-16 DIAGNOSIS — D508 Other iron deficiency anemias: Secondary | ICD-10-CM

## 2022-05-16 DIAGNOSIS — D500 Iron deficiency anemia secondary to blood loss (chronic): Secondary | ICD-10-CM

## 2022-05-16 DIAGNOSIS — R918 Other nonspecific abnormal finding of lung field: Secondary | ICD-10-CM | POA: Insufficient documentation

## 2022-05-16 DIAGNOSIS — C9000 Multiple myeloma not having achieved remission: Secondary | ICD-10-CM

## 2022-05-16 LAB — VITAMIN D 25 HYDROXY (VIT D DEFICIENCY, FRACTURES): Vit D, 25-Hydroxy: 70.64 ng/mL (ref 30–100)

## 2022-05-16 LAB — CBC WITH DIFFERENTIAL/PLATELET
Abs Immature Granulocytes: 0.02 10*3/uL (ref 0.00–0.07)
Basophils Absolute: 0 10*3/uL (ref 0.0–0.1)
Basophils Relative: 1 %
Eosinophils Absolute: 0.2 10*3/uL (ref 0.0–0.5)
Eosinophils Relative: 3 %
HCT: 41.5 % (ref 36.0–46.0)
Hemoglobin: 13.3 g/dL (ref 12.0–15.0)
Immature Granulocytes: 0 %
Lymphocytes Relative: 44 %
Lymphs Abs: 3.2 10*3/uL (ref 0.7–4.0)
MCH: 28.8 pg (ref 26.0–34.0)
MCHC: 32 g/dL (ref 30.0–36.0)
MCV: 89.8 fL (ref 80.0–100.0)
Monocytes Absolute: 0.5 10*3/uL (ref 0.1–1.0)
Monocytes Relative: 6 %
Neutro Abs: 3.4 10*3/uL (ref 1.7–7.7)
Neutrophils Relative %: 46 %
Platelets: 215 10*3/uL (ref 150–400)
RBC: 4.62 MIL/uL (ref 3.87–5.11)
RDW: 13 % (ref 11.5–15.5)
WBC: 7.3 10*3/uL (ref 4.0–10.5)
nRBC: 0 % (ref 0.0–0.2)

## 2022-05-16 LAB — VITAMIN B12: Vitamin B-12: 145 pg/mL — ABNORMAL LOW (ref 180–914)

## 2022-05-16 LAB — FERRITIN: Ferritin: 65 ng/mL (ref 11–307)

## 2022-05-16 LAB — IRON AND TIBC
Iron: 64 ug/dL (ref 28–170)
Saturation Ratios: 19 % (ref 10.4–31.8)
TIBC: 336 ug/dL (ref 250–450)
UIBC: 272 ug/dL

## 2022-05-16 MED ORDER — FLUCONAZOLE 150 MG PO TABS: 150.0000 mg | ORAL_TABLET | ORAL | 0 refills | Status: DC | PRN

## 2022-05-16 NOTE — Telephone Encounter (Signed)
Detailed message left for patient that rx for diflucan sent to pharmacy.

## 2022-05-16 NOTE — Addendum Note (Signed)
Addended by: Jannifer Rodney A on: 05/16/2022 09:27 AM   Modules accepted: Orders

## 2022-05-16 NOTE — Telephone Encounter (Signed)
Diflucan Prescription sent to pharmacy   

## 2022-05-20 ENCOUNTER — Telehealth: Payer: Self-pay | Admitting: Family

## 2022-05-20 NOTE — Telephone Encounter (Signed)
Contacted Forest Becker to schedule their annual wellness visit. Appointment made for 05/27/2022.  Thank you,  Judeth Cornfield,  AMB Clinical Support [facility] AWV Program Direct Dial ??[phone]

## 2022-05-21 LAB — METHYLMALONIC ACID, SERUM: Methylmalonic Acid, Quantitative: 553 nmol/L — ABNORMAL HIGH (ref 0–378)

## 2022-05-22 NOTE — Progress Notes (Unsigned)
[facility] [address] [place], [place] [address]   CLINIC:  Medical Oncology/Hematology  PCP:  Junie Spencer, FNP [address] [place] [address] [phone]   REASON FOR VISIT: Recurrent DVTs, B12 deficiency, vitamin D deficiency   CURRENT THERAPY: Eliquis  INTERVAL HISTORY:   [name] 77 y.o. female returns for routine follow-up of her recurrent DVTs, B12 deficiency, and vitamin D deficiency.  She was last evaluated via telemedicine visit by Rojelio Brenner PA-C on 01/06/2022.  At today's visit, she reports feeling fairly well.  She stays active working with [employer] and caring for her husband.  She has not had any episodes of DVT or PE in the interim since her last visit.  She denies any current symptoms of DVT such as unilateral leg swelling, pain, and erythema.   No current symptoms of PE such as shortness of breath, dyspnea on exertion, chest pain, hemoptysis, and palpitations.   She remains on Eliquis twice daily.  She denies any symptoms of bleeding such as rectal bleeding or melena.  She denies any new cough, changes in breathing, hemoptysis, or unintentional weight loss.   She is currently taking vitamin B12 1,000 mcg "here and there."  She is also taking vitamin D 50,000 units once weekly.  She has 75% energy and 100% appetite. She endorses that she is maintaining a stable weight.   ASSESSMENT & PLAN:  1.   History of recurrent DVTs: - Patient had a previous DVT in the right leg in 2011.  She was treated with 4 to 6 months of anticoagulation with resolution of the clot. - She was then diagnosed in 2019 with a left lower leg extremity DVT on a Doppler that was done 07/31/2017 that showed extensive left lower extremity DVT extending from the left peroneal vein through the femoral popliteal system. - She denied any recent extensive travel.  She was not on any estrogen supplementation.  She denies a history of  miscarriages.  She denies any recent surgeries.  She denies a family history of blood clots, heart attacks, or strokes. - It was deemed to be an unprovoked DVT. - Hypercoagulable workup NEGATIVE: Negative for factor V Leiden, PT gene mutation, beta-2 glycoprotein, and negative LA. - She has been on Eliquis since 2019 due to history of recurrent unprovoked - She remains on Eliquis to date, tolerating well without any major bleeding events.   - No current symptoms of acute DVT or PE. - Most recent labs (05/16/2022) show normal kidney function, normal CBC.  Last D-dimer on 01/10/2022 was normal. - PLAN: Continue Eliquis with periodic (annual) reassessment of ongoing risk/benefits of indefinite anticoagulation.  Patient is aware of alarm symptoms that would prompt immediate medical attention.   2.  Pulmonary nodule: - Patient had a CT of the chest done on 09/04/2017 which showed 2 new nodules in the LLL largest measuring 6 mm. - Patient had a repeat CT of the chest in December 2019 which showed nodules are stable at this time. - CT of chest on 02/16/2019 showed development of a new 3 mm right upper lobe pulmonary nodule.  Follow-up with noncontrast CT in 12 months.  6 mm left apical nodule remains stable from previous scans it is characterized as benign. - She denies any new cough, changes in breathing, hemoptysis, or unintentional weight loss. - PLAN: Discussed with patient that we need to obtain noncontrast CT chest for nodule follow-up.  She declines at this  time due to issues with transportation and financial constraints.     3.  Vitamin B12 deficiency: -Prior labs (01/10/2022): Vitamin B12 level low at 155, MMA elevated at 525 - She was instructed to take vitamin B12 2000 mcg daily, but admits to only taking vitamin B12 1000 mcg "here and there" - Most recent labs (05/16/2022): Persistent vitamin B12 deficiency with vitamin B12 145, MMA elevated 553 - PLAN: Stressed importance of compliance with  vitamin B12 supplements 2000 mcg daily.  Will recheck B12/MMA in 3 months and switch to injections if no improvement.    4.  Vitamin D deficiency: - She is currently taking vitamin D 50,000 units every week - Most recent labs (05/16/2022) show normal vitamin D70.64 - PLAN: Continue vitamin D 50,000 units every week  PLAN SUMMARY: >> Labs in 3 months = B12, MMA >> PHONE visit in 3 months (1 week after labs)     REVIEW OF SYSTEMS:   Review of Systems  Constitutional:  Positive for fatigue. Negative for appetite change, chills, diaphoresis, fever and unexpected weight change.  HENT:   Negative for lump/mass and nosebleeds.   Eyes:  Negative for eye problems.  Respiratory:  Negative for cough, hemoptysis and shortness of breath.   Cardiovascular:  Negative for chest pain, leg swelling and palpitations.  Gastrointestinal:  Positive for constipation. Negative for abdominal pain, blood in stool, diarrhea, nausea and vomiting.  Genitourinary:  Negative for hematuria.   Skin: Negative.   Neurological:  Negative for dizziness, headaches and light-headedness.  Hematological:  Does not bruise/bleed easily.     PHYSICAL EXAM:  ECOG PERFORMANCE STATUS: 1 - Symptomatic but completely ambulatory  There were no vitals filed for this visit. There were no vitals filed for this visit. Physical Exam Constitutional:      Appearance: Normal appearance. She is normal weight.  Cardiovascular:     Heart sounds: Normal heart sounds.  Pulmonary:     Breath sounds: Normal breath sounds.  Neurological:     General: No focal deficit present.     Mental Status: Mental status is at baseline.  Psychiatric:        Behavior: Behavior normal. Behavior is cooperative.     PAST MEDICAL/SURGICAL HISTORY:  Past Medical History:  Diagnosis Date   Anxiety    Hyperlipidemia    Seizures (HCC)    Past Surgical History:  Procedure Laterality Date   TUMOR EXCISION     Behind left eye    SOCIAL HISTORY:   Social History   Socioeconomic History   Marital status: Married    Spouse name: Christina Gonzales   Number of children: 3   Years of education: 12   Highest education level: 12th grade  Occupational History   Not on file  Tobacco Use   Smoking status: Never   Smokeless tobacco: Never  Vaping Use   Vaping Use: Never used  Substance and Sexual Activity   Alcohol use: No   Drug use: No   Sexual activity: Not Currently  Other Topics Concern   Not on file  Social History Narrative   Retired, lives with husband [place]. Two daughters living, one son deceased. Enjoys walking.    Social Determinants of Health   Financial Resource Strain: Not on file  Food Insecurity: Not on file  Transportation Needs: Not on file  Physical Activity: Not on file  Stress: Not on file  Social Connections: Not on file  Intimate Partner Violence: Not on file    FAMILY  HISTORY:  Family History  Problem Relation Age of Onset   Cancer Mother        lung   Diabetes Brother     CURRENT MEDICATIONS:  Outpatient Encounter Medications as of 05/23/2022  Medication Sig Note   amLODipine (NORVASC) 5 MG tablet Take 1 tablet (5 mg total) by mouth daily.    apixaban (ELIQUIS) 5 MG TABS tablet TAKE 1 TABLET BY MOUTH TWICE DAILY . APPOINTMENT REQUIRED FOR FUTURE REFILLS    atorvastatin (LIPITOR) 20 MG tablet Take 1 tablet (20 mg total) by mouth daily.    blood glucose meter kit and supplies Dispense based on patient and insurance preference. Use up to four times daily as directed. (FOR ICD-10 E10.9, E11.9).    Blood Glucose Monitoring Suppl DEVI 1 each by Does not apply route in the morning, at noon, and at bedtime. May substitute to any manufacturer covered by patient's insurance.    dapagliflozin propanediol (FARXIGA) 10 MG TABS tablet Take 1 tablet (10 mg total) by mouth daily before breakfast.    doxycycline (VIBRA-TABS) 100 MG tablet Take 1 tablet (100 mg total) by mouth 2 (two) times daily.    fluconazole  (DIFLUCAN) 150 MG tablet Take 1 tablet (150 mg total) by mouth every three (3) days as needed.    fluticasone (FLONASE) 50 MCG/ACT nasal spray Place 2 sprays into both nostrils daily.    Lancets (ONETOUCH DELICA PLUS LANCET33G) MISC Check twice a day and as needed    levETIRAcetam (KEPPRA) 500 MG tablet Take by mouth.    lisinopril (ZESTRIL) 40 MG tablet Take 1 tablet (40 mg total) by mouth daily.    Multiple Vitamins tablet Take 1 tablet by mouth. Reported on 05/26/2015 04/15/2013: Received from: [facility]   [facility] ULTRA test strip 1 each by Other route 4 (four) times daily.    OVER THE COUNTER MEDICATION Tumeric for inflammation.    phenazopyridine (PYRIDIUM) 95 MG tablet Take 1 tablet (95 mg total) by mouth 3 (three) times daily as needed for pain.    Semaglutide (RYBELSUS) 3 MG TABS Take 1 tablet (3 mg total) by mouth daily.    sodium chloride (OCEAN) 0.65 % SOLN nasal spray Place 1 spray into both nostrils as needed for congestion.    Vitamin D, Ergocalciferol, (DRISDOL) 1.25 MG (50000 UNIT) CAPS capsule Take 1 capsule (50,000 Units total) by mouth once a week.    zolpidem (AMBIEN) 5 MG tablet Take 1 tablet (5 mg total) by mouth at bedtime as needed for sleep.    No facility-administered encounter medications on file as of 05/23/2022.    ALLERGIES:  Allergies  Allergen Reactions   Heparin Nausea And Vomiting   Warfarin Other (See Comments)    unknown   Penicillins Rash    LABORATORY DATA:  I have reviewed the labs as listed.  CBC    Component Value Date/Time   WBC 7.3 05/16/2022 1308   RBC 4.62 05/16/2022 1308   HGB 13.3 05/16/2022 1308   HGB 12.6 04/10/2022 1018   HCT 41.5 05/16/2022 1308   HCT 37.3 04/10/2022 1018   PLT 215 05/16/2022 1308   PLT 215 04/10/2022 1018   MCV 89.8 05/16/2022 1308   MCV 87 04/10/2022 1018   MCH 28.8 05/16/2022 1308   MCHC 32.0 05/16/2022 1308   RDW 13.0 05/16/2022 1308   RDW 11.8 04/10/2022 1018   LYMPHSABS 3.2  05/16/2022 1308   LYMPHSABS 2.2 04/10/2022 1018   MONOABS 0.5  05/16/2022 1308   EOSABS 0.2 05/16/2022 1308   EOSABS 0.3 04/10/2022 1018   BASOSABS 0.0 05/16/2022 1308   BASOSABS 0.0 04/10/2022 1018      Latest Ref Rng & Units 04/09/2022   11:03 AM 01/10/2022   10:09 AM 11/13/2021   12:23 PM  CMP  Glucose 70 - 99 mg/dL 132  440  82   BUN 8 - 27 mg/dL Creatinine 0.57 - 1.00 mg/dL 1.02  7.25  3.66   Sodium 134 - 144 mmol/L 134  134  144   Potassium 3.5 - 5.2 mmol/L 4.8  4.0  4.3   Chloride 96 - 106 mmol/L 98  104  103   CO2 20 - 29 mmol/L Calcium 8.7 - 10.3 mg/dL 9.3  9.0  9.6   Total Protein 6.0 - 8.5 g/dL 6.5  7.4  7.1   Total Bilirubin 0.0 - 1.2 mg/dL 0.6  0.5  0.3   Alkaline Phos 44 - 121 IU/L 40  36  39   AST 0 - 40 IU/L 31  62  64   ALT 0 - 32 IU/L 40  89  76     DIAGNOSTIC IMAGING:  I have independently reviewed the relevant imaging and discussed with the patient.   WRAP UP:  All questions were answered. The patient knows to call the clinic with any problems, questions or concerns.  Medical decision making: Moderate  Time spent on visit: I spent 20 minutes counseling the patient face to face. The total time spent in the appointment was 30 minutes and more than 50% was on counseling.  Carnella Guadalajara, PA-C  05/23/22  2:44 PM

## 2022-05-23 ENCOUNTER — Encounter: Payer: Self-pay | Admitting: Physician Assistant

## 2022-05-23 ENCOUNTER — Inpatient Hospital Stay (HOSPITAL_BASED_OUTPATIENT_CLINIC_OR_DEPARTMENT_OTHER): Payer: Medicare HMO | Admitting: Physician Assistant

## 2022-05-23 VITALS — BP 159/59 | HR 69 | Temp 97.1°F | Resp 18 | Wt 162.5 lb

## 2022-05-23 DIAGNOSIS — Z86718 Personal history of other venous thrombosis and embolism: Secondary | ICD-10-CM | POA: Diagnosis not present

## 2022-05-23 DIAGNOSIS — R7989 Other specified abnormal findings of blood chemistry: Secondary | ICD-10-CM | POA: Diagnosis not present

## 2022-05-23 DIAGNOSIS — R918 Other nonspecific abnormal finding of lung field: Secondary | ICD-10-CM | POA: Diagnosis not present

## 2022-05-23 DIAGNOSIS — E538 Deficiency of other specified B group vitamins: Secondary | ICD-10-CM | POA: Diagnosis not present

## 2022-05-23 DIAGNOSIS — E559 Vitamin D deficiency, unspecified: Secondary | ICD-10-CM | POA: Diagnosis not present

## 2022-05-23 DIAGNOSIS — Z79899 Other long term (current) drug therapy: Secondary | ICD-10-CM | POA: Diagnosis not present

## 2022-05-23 DIAGNOSIS — Z801 Family history of malignant neoplasm of trachea, bronchus and lung: Secondary | ICD-10-CM | POA: Diagnosis not present

## 2022-05-23 DIAGNOSIS — Z7901 Long term (current) use of anticoagulants: Secondary | ICD-10-CM | POA: Diagnosis not present

## 2022-05-23 DIAGNOSIS — R5383 Other fatigue: Secondary | ICD-10-CM | POA: Diagnosis not present

## 2022-05-23 MED ORDER — VITAMIN B-12 1000 MCG PO TABS
2000.0000 ug | ORAL_TABLET | Freq: Every day | ORAL | 11 refills | Status: DC
Start: 2022-05-23 — End: 2022-09-12

## 2022-05-23 NOTE — Patient Instructions (Signed)
[facility] at [facility] **VISIT SUMMARY & IMPORTANT INSTRUCTIONS **   You were seen today by Rojelio Brenner PA-C for your follow-up visit.    HISTORY OF BLOOD CLOTS Since you have had multiple episodes of unprovoked blood clots in your legs, it is important that you continue to take daily blood thinner. Please see the attached handout for important information on assistance with the cost of your medication.  VITAMIN DEFICIENCIES Your vitamin D level is normal.  Continue to take vitamin D 50,000 units every week. Your vitamin B-12 level is low.  You will need to take vitamin B12 2000 mcg daily. It is EXTREMELY important that you take your vitamin B12 supplement EVERY DAY. If your vitamin B12 levels have not improved in 3 months, we will need to start you on vitamin B12 injections instead.  LUNG NODULE The CT scan of your chest in 2021 showed lung nodule and it was recommended for follow-up CT scan to be done in 1 year. CT scan was offered at this visit, which you have declined.  Please let us know if you change your mind in the future so that we can schedule you for a follow-up CT scan to make sure that this lung nodule has not grown into something more serious.  LABS: Return in 3 months for repeat labs  FOLLOW-UP APPOINTMENT: PHONE visit in 3 months (after labs)  ** Thank you for trusting me with your healthcare!  I strive to provide all of my patients with quality care at each visit.  If you receive a survey for this visit, I would be so grateful to you for taking the time to provide feedback.  Thank you in advance!  ~ Awesome Jared                   Dr. Doreatha Massed   &   Rojelio Brenner, PA-C   - - - - - - - - - - - - - - - - - -    Thank you for choosing [facility] at [facility] to provide your oncology and hematology care.  To afford each patient quality time with our provider, please arrive at least 15 minutes before your  scheduled appointment time.   If you have a lab appointment with the Cancer Center please come in thru the Main Entrance and check in at the main information desk.  You need to re-schedule your appointment should you arrive 10 or more minutes late.  We strive to give you quality time with our providers, and arriving late affects you and other patients whose appointments are after yours.  Also, if you no show three or more times for appointments you may be dismissed from the clinic at the providers discretion.     Again, thank you for choosing [facility].  Our hope is that these requests will decrease the amount of time that you wait before being seen by our physicians.       _____________________________________________________________  Should you have questions after your visit to [facility], please contact our office at [phone] and follow the prompts.  Our office hours are 8:00 a.m. and 4:30 p.m. Monday - Friday.  Please note that voicemails left after 4:00 p.m. may not be returned until the following business day.  We are closed weekends and major holidays.  You do have access to a nurse 24-7, just call the main number to  the clinic [phone] and do not press any options, hold on the line and a nurse will answer the phone.    For prescription refill requests, have your pharmacy contact our office and allow 72 hours.

## 2022-05-28 ENCOUNTER — Telehealth: Payer: Self-pay | Admitting: Family

## 2022-05-28 NOTE — Telephone Encounter (Signed)
Called patient to schedule Medicare Annual Wellness Visit (AWV). No voicemail available to leave a message.  Last date of AWV: 08/10/2020   Please schedule an appointment at any time with Vernona Rieger, [facility]. .  If any questions, please contact me at [phone].  Thank you,  Judeth Cornfield,  AMB Clinical Support [facility] AWV Program Direct Dial ??[phone]

## 2022-05-30 ENCOUNTER — Telehealth: Payer: Self-pay | Admitting: Family

## 2022-05-30 NOTE — Telephone Encounter (Signed)
Called patient to schedule Medicare Annual Wellness Visit (AWV). Left message for patient to call back and schedule Medicare Annual Wellness Visit (AWV).  Last date of AWV: 08/10/2020   Please schedule an appointment at any time with Vernona Rieger, [facility] .  If any questions, please contact me at [phone].  Thank you,  Judeth Cornfield,  AMB Clinical Support [facility] AWV Program Direct Dial ??[phone]

## 2022-06-10 ENCOUNTER — Telehealth: Payer: Self-pay | Admitting: Family

## 2022-06-10 NOTE — Telephone Encounter (Signed)
Pt wants to know what she can take OTC to help with her cough and cold.

## 2022-06-10 NOTE — Telephone Encounter (Signed)
Patient aware and verbalized understanding. WILL TRY PLANE MUCENIX WITH WATER WILL CALL BACK IF NO BETTER TO MAKE AN APPOINTMENT.

## 2022-06-12 ENCOUNTER — Other Ambulatory Visit: Payer: Self-pay

## 2022-06-12 ENCOUNTER — Encounter (HOSPITAL_COMMUNITY): Payer: Self-pay | Admitting: Emergency Medicine

## 2022-06-12 ENCOUNTER — Emergency Department (HOSPITAL_COMMUNITY): Payer: Medicare HMO

## 2022-06-12 ENCOUNTER — Ambulatory Visit (INDEPENDENT_AMBULATORY_CARE_PROVIDER_SITE_OTHER): Payer: Medicare HMO

## 2022-06-12 ENCOUNTER — Encounter: Payer: Self-pay | Admitting: Family Medicine

## 2022-06-12 ENCOUNTER — Observation Stay (HOSPITAL_COMMUNITY)
Admission: EM | Admit: 2022-06-12 | Discharge: 2022-06-13 | Disposition: A | Discharge status: Home / Self Care | Payer: Medicare HMO | Attending: Internal Medicine | Admitting: Internal Medicine

## 2022-06-12 ENCOUNTER — Ambulatory Visit (INDEPENDENT_AMBULATORY_CARE_PROVIDER_SITE_OTHER): Payer: Medicare HMO | Admitting: Family Medicine

## 2022-06-12 VITALS — BP 167/71 | HR 88 | Temp 98.5°F | Ht 62.0 in | Wt 159.2 lb

## 2022-06-12 DIAGNOSIS — R051 Acute cough: Secondary | ICD-10-CM

## 2022-06-12 DIAGNOSIS — J189 Pneumonia, unspecified organism: Principal | ICD-10-CM | POA: Diagnosis present

## 2022-06-12 DIAGNOSIS — R0689 Other abnormalities of breathing: Secondary | ICD-10-CM | POA: Diagnosis not present

## 2022-06-12 DIAGNOSIS — E1169 Type 2 diabetes mellitus with other specified complication: Secondary | ICD-10-CM | POA: Diagnosis not present

## 2022-06-12 DIAGNOSIS — R0902 Hypoxemia: Secondary | ICD-10-CM | POA: Diagnosis not present

## 2022-06-12 DIAGNOSIS — R0600 Dyspnea, unspecified: Secondary | ICD-10-CM | POA: Diagnosis not present

## 2022-06-12 DIAGNOSIS — R0602 Shortness of breath: Secondary | ICD-10-CM

## 2022-06-12 DIAGNOSIS — Z86718 Personal history of other venous thrombosis and embolism: Secondary | ICD-10-CM

## 2022-06-12 DIAGNOSIS — I10 Essential (primary) hypertension: Secondary | ICD-10-CM | POA: Diagnosis present

## 2022-06-12 DIAGNOSIS — R069 Unspecified abnormalities of breathing: Secondary | ICD-10-CM | POA: Diagnosis not present

## 2022-06-12 DIAGNOSIS — J9601 Acute respiratory failure with hypoxia: Secondary | ICD-10-CM | POA: Insufficient documentation

## 2022-06-12 DIAGNOSIS — Z743 Need for continuous supervision: Secondary | ICD-10-CM | POA: Diagnosis not present

## 2022-06-12 DIAGNOSIS — Z7984 Long term (current) use of oral hypoglycemic drugs: Secondary | ICD-10-CM | POA: Insufficient documentation

## 2022-06-12 DIAGNOSIS — Z1152 Encounter for screening for COVID-19: Secondary | ICD-10-CM | POA: Insufficient documentation

## 2022-06-12 DIAGNOSIS — R059 Cough, unspecified: Secondary | ICD-10-CM | POA: Diagnosis not present

## 2022-06-12 DIAGNOSIS — Z79899 Other long term (current) drug therapy: Secondary | ICD-10-CM | POA: Diagnosis not present

## 2022-06-12 DIAGNOSIS — R569 Unspecified convulsions: Secondary | ICD-10-CM

## 2022-06-12 DIAGNOSIS — E785 Hyperlipidemia, unspecified: Secondary | ICD-10-CM | POA: Insufficient documentation

## 2022-06-12 DIAGNOSIS — E119 Type 2 diabetes mellitus without complications: Secondary | ICD-10-CM

## 2022-06-12 DIAGNOSIS — Z7901 Long term (current) use of anticoagulants: Secondary | ICD-10-CM | POA: Insufficient documentation

## 2022-06-12 HISTORY — DX: Acute embolism and thrombosis of unspecified deep veins of unspecified lower extremity: I82.409

## 2022-06-12 LAB — BASIC METABOLIC PANEL
Anion gap: 10 (ref 5–15)
BUN: 15 mg/dL (ref 8–23)
CO2: 24 mmol/L (ref 22–32)
Calcium: 9.2 mg/dL (ref 8.9–10.3)
Chloride: 103 mmol/L (ref 98–111)
Creatinine, Ser: 0.59 mg/dL (ref 0.44–1.00)
GFR, Estimated: >60 mL/min (ref >60)
Glucose, Bld: 102 mg/dL — ABNORMAL HIGH (ref 70–99)
Potassium: 3.7 mmol/L (ref 3.5–5.1)
Sodium: 137 mmol/L (ref 135–145)

## 2022-06-12 LAB — CBC WITH DIFFERENTIAL/PLATELET
Abs Immature Granulocytes: 0.02 10*3/uL (ref 0.00–0.07)
Basophils Absolute: 0 10*3/uL (ref 0.0–0.1)
Basophils Relative: 1 %
Eosinophils Absolute: 0.1 10*3/uL (ref 0.0–0.5)
Eosinophils Relative: 1 %
HCT: 44.4 % (ref 36.0–46.0)
Hemoglobin: 14.9 g/dL (ref 12.0–15.0)
Immature Granulocytes: 0 %
Lymphocytes Relative: 41 %
Lymphs Abs: 2.5 10*3/uL (ref 0.7–4.0)
MCH: 29.3 pg (ref 26.0–34.0)
MCHC: 33.6 g/dL (ref 30.0–36.0)
MCV: 87.2 fL (ref 80.0–100.0)
Monocytes Absolute: 0.4 10*3/uL (ref 0.1–1.0)
Monocytes Relative: 7 %
Neutro Abs: 3 10*3/uL (ref 1.7–7.7)
Neutrophils Relative %: 50 %
Platelets: 159 10*3/uL (ref 150–400)
RBC: 5.09 MIL/uL (ref 3.87–5.11)
RDW: 13.3 % (ref 11.5–15.5)
WBC: 6.1 10*3/uL (ref 4.0–10.5)
nRBC: 0 % (ref 0.0–0.2)

## 2022-06-12 LAB — RESP PANEL BY RT-PCR (RSV, FLU A&B, COVID)  RVPGX2
Influenza A by PCR: NEGATIVE
Influenza B by PCR: NEGATIVE
Resp Syncytial Virus by PCR: NEGATIVE
SARS Coronavirus 2 by RT PCR: NEGATIVE

## 2022-06-12 LAB — LACTIC ACID, PLASMA
Lactic Acid, Venous: 1.6 mmol/L (ref 0.5–1.9)
Lactic Acid, Venous: 1.6 mmol/L (ref 0.5–1.9)

## 2022-06-12 LAB — BRAIN NATRIURETIC PEPTIDE: B Natriuretic Peptide: 9 pg/mL (ref 0.0–100.0)

## 2022-06-12 LAB — CULTURE, BLOOD (ROUTINE X 2)

## 2022-06-12 MED ORDER — ACETAMINOPHEN 650 MG RE SUPP: 650.0000 mg | Freq: Four times a day (QID) | RECTAL | Status: DC | PRN

## 2022-06-12 MED ORDER — IPRATROPIUM-ALBUTEROL 0.5-2.5 (3) MG/3ML IN SOLN
3.0000 mL | Freq: Once | RESPIRATORY_TRACT | Status: AC
  Administered 2022-06-12: 3 mL via RESPIRATORY_TRACT
  Filled 2022-06-12: qty 3

## 2022-06-12 MED ORDER — ONDANSETRON HCL 4 MG/2ML IJ SOLN: 4.0000 mg | Freq: Four times a day (QID) | INTRAMUSCULAR | Status: DC | PRN

## 2022-06-12 MED ORDER — ZOLPIDEM TARTRATE 5 MG PO TABS: 5.0000 mg | ORAL_TABLET | Freq: Every evening | ORAL | Status: DC | PRN

## 2022-06-12 MED ORDER — AMLODIPINE BESYLATE 5 MG PO TABS
5.0000 mg | ORAL_TABLET | Freq: Every day | ORAL | Status: DC
  Administered 2022-06-13: 5 mg via ORAL
  Filled 2022-06-12: qty 1

## 2022-06-12 MED ORDER — ONDANSETRON HCL 4 MG PO TABS: 4.0000 mg | ORAL_TABLET | Freq: Four times a day (QID) | ORAL | Status: DC | PRN

## 2022-06-12 MED ORDER — APIXABAN 5 MG PO TABS
5.0000 mg | ORAL_TABLET | Freq: Two times a day (BID) | ORAL | Status: DC
  Administered 2022-06-12 – 2022-06-13 (×2): 5 mg via ORAL
  Filled 2022-06-12 (×2): qty 1

## 2022-06-12 MED ORDER — SODIUM CHLORIDE 0.9 % IV SOLN
500.0000 mg | INTRAVENOUS | Status: DC
  Administered 2022-06-12: 500 mg via INTRAVENOUS
  Filled 2022-06-12: qty 5

## 2022-06-12 MED ORDER — IOHEXOL 350 MG/ML SOLN
75.0000 mL | Freq: Once | INTRAVENOUS | Status: AC | PRN
  Administered 2022-06-12: 75 mL via INTRAVENOUS

## 2022-06-12 MED ORDER — DAPAGLIFLOZIN PROPANEDIOL 10 MG PO TABS
10.0000 mg | ORAL_TABLET | Freq: Every day | ORAL | Status: DC
  Administered 2022-06-13: 10 mg via ORAL
  Filled 2022-06-12: qty 1

## 2022-06-12 MED ORDER — ACETAMINOPHEN 325 MG PO TABS: 650.0000 mg | ORAL_TABLET | Freq: Four times a day (QID) | ORAL | Status: DC | PRN

## 2022-06-12 MED ORDER — LEVETIRACETAM 500 MG PO TABS
500.0000 mg | ORAL_TABLET | Freq: Two times a day (BID) | ORAL | Status: DC
  Administered 2022-06-12 – 2022-06-13 (×2): 500 mg via ORAL
  Filled 2022-06-12 (×2): qty 1

## 2022-06-12 MED ORDER — MORPHINE SULFATE (PF) 2 MG/ML IV SOLN: 2.0000 mg | INTRAVENOUS | Status: DC | PRN

## 2022-06-12 MED ORDER — SODIUM CHLORIDE 0.9 % IV SOLN
1.0000 g | Freq: Once | INTRAVENOUS | Status: AC
  Administered 2022-06-12: 1 g via INTRAVENOUS
  Filled 2022-06-12: qty 10

## 2022-06-12 MED ORDER — ATORVASTATIN CALCIUM 20 MG PO TABS
20.0000 mg | ORAL_TABLET | Freq: Every day | ORAL | Status: DC
  Administered 2022-06-13: 20 mg via ORAL
  Filled 2022-06-12: qty 1

## 2022-06-12 MED ORDER — OXYCODONE HCL 5 MG PO TABS: 5.0000 mg | ORAL_TABLET | ORAL | Status: DC | PRN

## 2022-06-12 MED ORDER — SODIUM CHLORIDE 0.9 % IV SOLN: 2.0000 g | INTRAVENOUS | Status: DC

## 2022-06-12 MED ORDER — LISINOPRIL 10 MG PO TABS
40.0000 mg | ORAL_TABLET | Freq: Every day | ORAL | Status: DC
  Administered 2022-06-13: 40 mg via ORAL
  Filled 2022-06-12: qty 4

## 2022-06-12 NOTE — Progress Notes (Signed)
Acute Office Visit  Subjective:     Patient ID: Christina Gonzales, female    DOB: 31-Mar-1945, 77 y.o.   MRN: [id number]  Chief Complaint  Patient presents with   Cough   Shortness of Breath    Cough Associated symptoms include shortness of breath.  Shortness of Breath   Patient is in today for a cough and congestion. This started 2 days ago. Also reports sore throat. She denies fever, chills, chest pain, edema, wheezing. She started feeling short of breath suddenly today on her way to the visit. Worsens with activity. She has had to sleep with pillows due to trouble breathing since last night. History of DVT. She has been spacing out her eliqus for months now due to the cost. No history of chronic lung disease or CHF. She has been taking mucinex without improvement.    Review of Systems  Respiratory:  Positive for cough and shortness of breath.    As per HPI.     Objective:    BP (!) 167/71   Pulse 88   Temp 98.5 F (36.9 C) (Temporal)   Ht 5\' 2"  (1.575 m)   Wt 159 lb 4 oz (72.2 kg)   BMI 29.13 kg/m    Physical Exam Vitals and nursing note reviewed.  Constitutional:      General: She is not in acute distress.    Appearance: She is well-developed. She is not ill-appearing, toxic-appearing or diaphoretic.  HENT:     Mouth/Throat:     Pharynx: Oropharynx is clear. No pharyngeal swelling or oropharyngeal exudate.  Cardiovascular:     Rate and Rhythm: Normal rate and regular rhythm.  Pulmonary:     Effort: Pulmonary effort is normal.     Breath sounds: Examination of the right-upper field reveals decreased breath sounds and rhonchi. Examination of the left-upper field reveals decreased breath sounds and rhonchi. Examination of the right-middle field reveals rhonchi. Examination of the left-middle field reveals decreased breath sounds and rhonchi. Examination of the right-lower field reveals decreased breath sounds. Examination of the left-lower field reveals  decreased breath sounds. Decreased breath sounds and rhonchi present. No wheezing or rales.  Musculoskeletal:     Cervical back: Normal range of motion and neck supple.     Right lower leg: No edema.     Left lower leg: No edema.  Skin:    General: Skin is warm and dry.  Neurological:     General: No focal deficit present.     Mental Status: She is alert and oriented to person, place, and time.  Psychiatric:        Mood and Affect: Mood normal.        Behavior: Behavior normal.     No results found for any visits on 06/12/22.      Assessment & Plan:   Christina Gonzales was seen today for cough and shortness of breath.  Diagnoses and all orders for this visit:  Shortness of breath -     DG Chest 2 View; Future  Hypoxia  Acute cough  History of deep vein thrombosis (DVT) of lower extremity   Acute shortness of breath, decrease breath sounds. Pulse ox of 85-87 on room air at rest. CXR normal today- agree with radiology read. Hx of DVT and has been spacing out eliquis due to cough. Discussed concerns for possible PE given normal CXR and acute onset hypoxia and shortness of breath without chronic lung history or CHF hx. No edema on exam.  Discussed need for further evaluation in ER as this could result in death in not evaluated emergently. She agrees with plan. EMS notified and care transferred to EMS upon arrival.     Gabriel Earing, FNP

## 2022-06-12 NOTE — ED Notes (Signed)
Pt oxygen in at 87% on RA, placed on 3L

## 2022-06-12 NOTE — ED Provider Notes (Signed)
[place] EMERGENCY DEPARTMENT AT [facility] Provider Note   CSN: [id number] Arrival date & time: 06/12/22  1738     History  Chief Complaint  Patient presents with   Shortness of Breath    Christina Gonzales is a 77 y.o. female.  Pt is a 77 yo female with pmhx significant for DVT on Eliquis, HTN, HLD, DM, seizures, and anxiety.  Pt said she's been sob since yesterday.  She thought she may have bronchitis.  She started taking Mucinex last night without improvement.  She went to her pcp today and her O2 sat was 85-87% on RA (pt is not normally on O2).  She was sent here for further eval.  Pt has had a cough.  No fever.  O2 sat 87% on RA here, so she was put on 3L and O2 is up to the low 90s.  Pt said the Eliquis is very expensive, so she's not been taking it as directed b/c she's been trying to make it last longer.       Home Medications Prior to Admission medications   Medication Sig Start Date End Date Taking? Authorizing Provider  amLODipine (NORVASC) 5 MG tablet Take 1 tablet (5 mg total) by mouth daily. 04/09/22   Junie Spencer, FNP  apixaban (ELIQUIS) 5 MG TABS tablet TAKE 1 TABLET BY MOUTH TWICE DAILY . APPOINTMENT REQUIRED FOR FUTURE REFILLS 03/12/22   Rojelio Brenner M, PA-C  atorvastatin (LIPITOR) 20 MG tablet Take 1 tablet (20 mg total) by mouth daily. 04/09/22   Junie Spencer, FNP  blood glucose meter kit and supplies Dispense based on patient and insurance preference. Use up to four times daily as directed. (FOR ICD-10 E10.9, E11.9). 07/26/19   Jannifer Rodney A, FNP  Blood Glucose Monitoring Suppl DEVI 1 each by Does not apply route in the morning, at noon, and at bedtime. May substitute to any manufacturer covered by patient's insurance. 04/10/22   Jannifer Rodney A, FNP  cyanocobalamin (VITAMIN B12) 1000 MCG tablet Take 2 tablets (2,000 mcg total) by mouth daily. 05/23/22   Carnella Guadalajara, PA-C  dapagliflozin propanediol (FARXIGA) 10 MG TABS tablet  Take 1 tablet (10 mg total) by mouth daily before breakfast. 04/17/22   Junie Spencer, FNP  fluticasone (FLONASE) 50 MCG/ACT nasal spray Place 2 sprays into both nostrils daily. 01/15/22   Junie Spencer, FNP  Lancets [facility] DELICA PLUS Hernandez) MISC Check twice a day and as needed 12/14/19   Junie Spencer, FNP  levETIRAcetam (KEPPRA) 500 MG tablet Take by mouth. 11/30/20   [provider]  lisinopril (ZESTRIL) 40 MG tablet Take 1 tablet (40 mg total) by mouth daily. 04/09/22   Junie Spencer, FNP  Multiple Vitamins tablet Take 1 tablet by mouth. Reported on 05/26/2015    [provider]  [facility] ULTRA test strip 1 each by Other route 4 (four) times daily. 12/14/19   Junie Spencer, FNP  OVER THE COUNTER MEDICATION Tumeric for inflammation.    [provider]  Semaglutide (RYBELSUS) 3 MG TABS Take 1 tablet (3 mg total) by mouth daily. 04/23/22   Jannifer Rodney A, FNP  sodium chloride (OCEAN) 0.65 % SOLN nasal spray Place 1 spray into both nostrils as needed for congestion. 02/07/22   Daryll Drown, NP  Vitamin D, Ergocalciferol, (DRISDOL) 1.25 MG (50000 UNIT) CAPS capsule Take 1 capsule (50,000 Units total) by mouth once a week. 05/31/21   Junie Spencer, FNP  zolpidem (AMBIEN) 5 MG tablet Take 1 tablet (5 mg total) by mouth at bedtime as needed for sleep. 04/09/22   Jannifer Rodney A, FNP      Allergies    Heparin, Warfarin, and Penicillins    Review of Systems   Review of Systems  Respiratory:  Positive for cough and shortness of breath.   All other systems reviewed and are negative.   Physical Exam Updated Vital Signs BP (!) 149/69   Pulse 80   Temp 98.5 F (36.9 C)   Resp (!) 23   Ht 5\' 2"  (1.575 m)   Wt 72.2 kg   SpO2 98%   BMI 29.11 kg/m  Physical Exam Vitals and nursing note reviewed.  Constitutional:      Appearance: She is well-developed.  HENT:     Head: Normocephalic and atraumatic.     Mouth/Throat:     Mouth: Mucous membranes  are moist.     Pharynx: Oropharynx is clear.  Eyes:     Extraocular Movements: Extraocular movements intact.     Pupils: Pupils are equal, round, and reactive to light.  Cardiovascular:     Rate and Rhythm: Normal rate and regular rhythm.  Pulmonary:     Breath sounds: Rhonchi present.  Abdominal:     General: Bowel sounds are normal.     Palpations: Abdomen is soft.  Musculoskeletal:        General: Normal range of motion.     Cervical back: Normal range of motion and neck supple.  Skin:    General: Skin is warm.     Capillary Refill: Capillary refill takes less than 2 seconds.  Neurological:     General: No focal deficit present.     Mental Status: She is alert and oriented to person, place, and time.  Psychiatric:        Mood and Affect: Mood normal.        Behavior: Behavior normal.     ED Results / Procedures / Treatments   Labs (all labs ordered are listed, but only abnormal results are displayed) Labs Reviewed  BASIC METABOLIC PANEL - Abnormal; Notable for the following components:      Result Value   Glucose, Bld 102 (*)    All other components within normal limits  RESP PANEL BY RT-PCR (RSV, FLU A&B, COVID)  RVPGX2  CULTURE, BLOOD (ROUTINE X 2)  CULTURE, BLOOD (ROUTINE X 2)  BRAIN NATRIURETIC PEPTIDE  CBC WITH DIFFERENTIAL/PLATELET  LACTIC ACID, PLASMA  LACTIC ACID, PLASMA    EKG EKG Interpretation  Date/Time:  Wednesday Jun 12 2022 18:20:42 EDT Ventricular Rate:  81 PR Interval:  150 QRS Duration: 89 QT Interval:  371 QTC Calculation: 431 R Axis:   44 Text Interpretation: Sinus rhythm Low voltage, precordial leads No old tracing to compare Confirmed by Jacalyn Lefevre [id number]) on 06/12/2022 7:09:57 PM  Radiology CT Angio Chest PE W and/or Wo Contrast  Result Date: 06/12/2022 CLINICAL DATA:  Dyspnea cough EXAM: CT ANGIOGRAPHY CHEST WITH CONTRAST TECHNIQUE: Multidetector CT imaging of the chest was performed using the standard protocol during bolus  administration of intravenous contrast. Multiplanar CT image reconstructions and MIPs were obtained to evaluate the vascular anatomy. RADIATION DOSE REDUCTION: This exam was performed according to the departmental dose-optimization program which includes automated exposure control, adjustment of the mA and/or kV according to patient size and/or use of iterative reconstruction technique. CONTRAST:  75mL OMNIPAQUE IOHEXOL 350 MG/ML SOLN COMPARISON:  Chest x-ray 06/12/2022, CT 02/16/2019  FINDINGS: Cardiovascular: Satisfactory opacification of the pulmonary arteries to the segmental level. No evidence of pulmonary embolism. Nonaneurysmal aorta. Mild atherosclerosis. No dissection. Mild coronary vascular calcification. Normal cardiac size. No pericardial effusion Mediastinum/Nodes: No enlarged mediastinal, hilar, or axillary lymph nodes. Thyroid gland, trachea, and esophagus demonstrate no significant findings. Lungs/Pleura: No consolidation or effusion. 5 mm left apical lung nodule, stable, no imaging follow-up is recommended. Minimal tree-in-bud density at the right middle lobe on series 6, image 88, consistent with respiratory infection Upper Abdomen: No acute abnormality. Musculoskeletal: No chest wall abnormality. No acute or significant osseous findings. Review of the MIP images confirms the above findings. IMPRESSION: 1. Negative for acute pulmonary embolus or aortic dissection. 2. Minimal tree-in-bud density at the right middle lobe consistent with respiratory infection. Aortic Atherosclerosis (ICD10-I70.0). Electronically Signed   By: Jasmine Pang M.D.   On: 06/12/2022 20:01   DG Chest 2 View  Result Date: 06/12/2022 CLINICAL DATA:  Dyspnea, cough EXAM: CHEST - 2 VIEW COMPARISON:  Chest radiograph 02/06/2022. FINDINGS: The cardiomediastinal silhouette is normal There is no focal consolidation or pulmonary edema. There is no pleural effusion or pneumothorax There is no acute osseous abnormality. IMPRESSION:  No radiographic evidence of acute cardiopulmonary process. Electronically Signed   By: Lesia Hausen M.D.   On: 06/12/2022 16:33    Procedures Procedures    Medications Ordered in ED Medications  cefTRIAXone (ROCEPHIN) 1 g in sodium chloride 0.9 % 100 mL IVPB (has no administration in time range)  azithromycin (ZITHROMAX) 500 mg in sodium chloride 0.9 % 250 mL IVPB (has no administration in time range)  iohexol (OMNIPAQUE) 350 MG/ML injection 75 mL (75 mLs Intravenous Contrast Given 06/12/22 1938)  ipratropium-albuterol (DUONEB) 0.5-2.5 (3) MG/3ML nebulizer solution 3 mL (3 mLs Nebulization Given 06/12/22 2014)    ED Course/ Medical Decision Making/ A&P                             Medical Decision Making Amount and/or Complexity of Data Reviewed Labs: ordered. Radiology: ordered.  Risk Prescription drug management. Decision regarding hospitalization.   This patient presents to the ED for concern of sob, this involves an extensive number of treatment options, and is a complaint that carries with it a high risk of complications and morbidity.  The differential diagnosis includes pna, PE, covid/flu   Co morbidities that complicate the patient evaluation  VT on Eliquis, HTN, HLD, DM, seizures, and anxiety   Additional history obtained:  Additional history obtained from epic chart review  Lab Tests:  I Ordered, and personally interpreted labs.  The pertinent results include:  cbc nl, bmp nl, bnp nl   Imaging Studies ordered:  I ordered imaging studies including ct chest  I independently visualized and interpreted imaging which showed  Negative for acute pulmonary embolus or aortic dissection.  2. Minimal tree-in-bud density at the right middle lobe consistent  with respiratory infection.    Aortic Atherosclerosis (ICD10-I70.0).   I agree with the radiologist interpretation   Cardiac Monitoring:  The patient was maintained on a cardiac monitor.  I personally viewed  and interpreted the cardiac monitored which showed an underlying rhythm of: nsr   Medicines ordered and prescription drug management:  I ordered medication including rocephin/zithromax  for pna  Reevaluation of the patient after these medicines showed that the patient improved I have reviewed the patients home medicines and have made adjustments as needed   Test Considered:  ct   Critical Interventions:  Oxygen/abx   Consultations Obtained:  I requested consultation with the hospitalist (Dr. Carren Rang),  and discussed lab and imaging findings as well as pertinent plan -she will admit   Problem List / ED Course:  CAP RML + resp failure:  pt put on 2L and O2 sats are in the low 90s.  Pt given rocephin/zithromax for the pna.  No PE on CT.   Reevaluation:  After the interventions noted above, I reevaluated the patient and found that they have :improved   Social Determinants of Health:  Lives at home   Dispostion:  After consideration of the diagnostic results and the patients response to treatment, I feel that the patent would benefit from admission.    CRITICAL CARE Performed by: Jacalyn Lefevre   Total critical care time: 30 minutes  Critical care time was exclusive of separately billable procedures and treating other patients.  Critical care was necessary to treat or prevent imminent or life-threatening deterioration.  Critical care was time spent personally by me on the following activities: development of treatment plan with patient and/or surrogate as well as nursing, discussions with consultants, evaluation of patient's response to treatment, examination of patient, obtaining history from patient or surrogate, ordering and performing treatments and interventions, ordering and review of laboratory studies, ordering and review of radiographic studies, pulse oximetry and re-evaluation of patient's condition.         Final Clinical Impression(s) / ED  Diagnoses Final diagnoses:  Community acquired pneumonia of right middle lobe of lung  Acute respiratory failure with hypoxia [facility])    Rx / DC Orders ED Discharge Orders     None         Jacalyn Lefevre, MD 06/12/22 2109

## 2022-06-12 NOTE — ED Triage Notes (Signed)
Pt was sent over by Pcp. They sent her over to rule out PE. Pt has not been taking eliquis as directed due to cost.

## 2022-06-12 NOTE — ED Notes (Signed)
Cultures draw prior to starting antibiotics

## 2022-06-13 DIAGNOSIS — E785 Hyperlipidemia, unspecified: Secondary | ICD-10-CM | POA: Diagnosis not present

## 2022-06-13 DIAGNOSIS — E119 Type 2 diabetes mellitus without complications: Secondary | ICD-10-CM

## 2022-06-13 DIAGNOSIS — E1169 Type 2 diabetes mellitus with other specified complication: Secondary | ICD-10-CM

## 2022-06-13 DIAGNOSIS — Z86718 Personal history of other venous thrombosis and embolism: Secondary | ICD-10-CM

## 2022-06-13 DIAGNOSIS — I10 Essential (primary) hypertension: Secondary | ICD-10-CM | POA: Diagnosis not present

## 2022-06-13 DIAGNOSIS — J189 Pneumonia, unspecified organism: Principal | ICD-10-CM

## 2022-06-13 DIAGNOSIS — R569 Unspecified convulsions: Secondary | ICD-10-CM

## 2022-06-13 DIAGNOSIS — J9601 Acute respiratory failure with hypoxia: Secondary | ICD-10-CM | POA: Diagnosis not present

## 2022-06-13 LAB — CBC WITH DIFFERENTIAL/PLATELET
Abs Immature Granulocytes: 0 10*3/uL (ref 0.00–0.07)
Basophils Absolute: 0 10*3/uL (ref 0.0–0.1)
Basophils Relative: 0 %
Eosinophils Absolute: 0.1 10*3/uL (ref 0.0–0.5)
Eosinophils Relative: 1 %
HCT: 42.5 % (ref 36.0–46.0)
Hemoglobin: 14.2 g/dL (ref 12.0–15.0)
Lymphocytes Relative: 46 %
Lymphs Abs: 3.5 10*3/uL (ref 0.7–4.0)
MCH: 29.3 pg (ref 26.0–34.0)
MCHC: 33.4 g/dL (ref 30.0–36.0)
MCV: 87.8 fL (ref 80.0–100.0)
Monocytes Absolute: 0.7 10*3/uL (ref 0.1–1.0)
Monocytes Relative: 9 %
Neutro Abs: 3.3 10*3/uL (ref 1.7–7.7)
Neutrophils Relative %: 44 %
Platelets: 166 10*3/uL (ref 150–400)
RBC: 4.84 MIL/uL (ref 3.87–5.11)
RDW: 13.5 % (ref 11.5–15.5)
WBC Morphology: REACTIVE
WBC: 7.5 10*3/uL (ref 4.0–10.5)
nRBC: 0 % (ref 0.0–0.2)

## 2022-06-13 LAB — COMPREHENSIVE METABOLIC PANEL
ALT: 63 U/L — ABNORMAL HIGH (ref 0–44)
AST: 47 U/L — ABNORMAL HIGH (ref 15–41)
Albumin: 3.7 g/dL (ref 3.5–5.0)
Alkaline Phosphatase: 29 U/L — ABNORMAL LOW (ref 38–126)
Anion gap: 8 (ref 5–15)
BUN: 16 mg/dL (ref 8–23)
CO2: 25 mmol/L (ref 22–32)
Calcium: 9.1 mg/dL (ref 8.9–10.3)
Chloride: 104 mmol/L (ref 98–111)
Creatinine, Ser: 0.58 mg/dL (ref 0.44–1.00)
GFR, Estimated: >60 mL/min (ref >60)
Glucose, Bld: 120 mg/dL — ABNORMAL HIGH (ref 70–99)
Potassium: 4.6 mmol/L (ref 3.5–5.1)
Sodium: 137 mmol/L (ref 135–145)
Total Bilirubin: 0.6 mg/dL (ref 0.3–1.2)
Total Protein: 7.4 g/dL (ref 6.5–8.1)

## 2022-06-13 LAB — STREP PNEUMONIAE URINARY ANTIGEN: Strep Pneumo Urinary Antigen: NEGATIVE

## 2022-06-13 LAB — GLUCOSE, CAPILLARY
Glucose-Capillary: 123 mg/dL — ABNORMAL HIGH (ref 70–99)
Glucose-Capillary: 114 mg/dL — ABNORMAL HIGH (ref 70–99)

## 2022-06-13 LAB — PROCALCITONIN: Procalcitonin: <0.1 ng/mL

## 2022-06-13 LAB — MAGNESIUM: Magnesium: 2.3 mg/dL (ref 1.7–2.4)

## 2022-06-13 MED ORDER — DM-GUAIFENESIN ER 30-600 MG PO TB12: 1.0000 | ORAL_TABLET | Freq: Two times a day (BID) | ORAL | 0 refills | Status: AC

## 2022-06-13 MED ORDER — INSULIN ASPART 100 UNIT/ML IJ SOLN: 0.0000 [IU] | Freq: Three times a day (TID) | INTRAMUSCULAR | Status: DC

## 2022-06-13 MED ORDER — DOXYCYCLINE HYCLATE 100 MG PO TABS: 100.0000 mg | ORAL_TABLET | Freq: Two times a day (BID) | ORAL | 0 refills | Status: AC

## 2022-06-13 MED ORDER — IPRATROPIUM-ALBUTEROL 20-100 MCG/ACT IN AERS: 1.0000 | INHALATION_SPRAY | Freq: Three times a day (TID) | RESPIRATORY_TRACT | 1 refills | Status: DC | PRN

## 2022-06-13 MED ORDER — LORATADINE 10 MG PO TABS: 10.0000 mg | ORAL_TABLET | Freq: Every day | ORAL | 0 refills | Status: DC

## 2022-06-13 MED ORDER — INSULIN ASPART 100 UNIT/ML IJ SOLN: 0.0000 [IU] | Freq: Every day | INTRAMUSCULAR | Status: DC

## 2022-06-13 MED ORDER — DOXYCYCLINE HYCLATE 100 MG PO TABS
100.0000 mg | ORAL_TABLET | Freq: Two times a day (BID) | ORAL | Status: DC
  Administered 2022-06-13: 100 mg via ORAL
  Filled 2022-06-13: qty 1

## 2022-06-13 MED ORDER — DM-GUAIFENESIN ER 30-600 MG PO TB12
1.0000 | ORAL_TABLET | Freq: Two times a day (BID) | ORAL | Status: DC
  Administered 2022-06-13: 1 via ORAL
  Filled 2022-06-13: qty 1

## 2022-06-13 NOTE — Progress Notes (Signed)
Patient has been stable. No acute events overnight.  Patient came to floor on oxygen at 2 liters via nasal cannula.  Patient has worn it intermittently and oxygen saturations have been in the 90s.

## 2022-06-13 NOTE — Assessment & Plan Note (Addendum)
-   Continue treatment as per community-acquired pneumonia/bronchitis -Patient not requiring oxygen supplementation at time of discharge.

## 2022-06-13 NOTE — Assessment & Plan Note (Addendum)
-   Continue Keppra -No seizure activity appreciated throughout hospitalization. -Continue outpatient follow-up with neurology service.

## 2022-06-13 NOTE — Assessment & Plan Note (Addendum)
-   Resume home hypoglycemic regimen -Close monitoring of patient's CBGs/A1c recommended to further adjust therapy. -Modified carbohydrate diet discussed with patient.

## 2022-06-13 NOTE — Assessment & Plan Note (Addendum)
-   No pulmonary embolus seen appreciated on patient's CT angiogram of the chest. -Continue Eliquis twice a day as previously prescribed for secondary prevention.

## 2022-06-13 NOTE — Discharge Summary (Signed)
Physician Discharge Summary   Patient: Christina Gonzales MRN: [id number] DOB: [birth year]  Admit date:     06/12/2022  Discharge date: 06/13/22  Discharge Physician: Vassie Loll   PCP: Junie Spencer, FNP   Recommendations at discharge:  Repeat chest x-ray 8 in 6 weeks to assure complete resolution of infiltrates. Repeat basic metabolic panel to follow electrolytes and renal function Continue to closely follow CBGs/A1c with further adjustment to hypoglycemia regimen as needed.  Discharge Diagnoses: Principal Problem:   CAP (community acquired pneumonia) Active Problems:   Hyperlipidemia associated with type 2 diabetes mellitus (HCC)   Essential hypertension, benign   Seizures (HCC)   History of deep vein thrombosis (DVT) of lower extremity   Diabetes mellitus type 2 in nonobese [facility])   Acute respiratory failure with hypoxia [facility])  Brief Hospital admission course: As per H&P written by Dr. Carren Rang on 06/13/22 Christina Gonzales is a 77 y.o. female with medical history significant of history of anxiety, DVT, hyperlipidemia, seizures presents to the ED with a chief complaint of dyspnea.  Patient reports that she had onset of URI symptoms 2 days ago.  She called her PCP who advised her to take Mucinex.  After couple of days and Mucinex she still felt no better, so she went to her PCP.  She reports she became very dyspneic there, and she told them of her nonadherence to Eliquis and they advised her to come to the ER to rule out a PE.  Patient reports that Eliquis is very expensive, sensitive taking it twice a day she tries to take it once a day to extend the prescription.  She reports understanding that this is not safe practice, and reports that she is going to take it as prescribed from now on.  Patient reports she had a subjective fever at home.  She had a cough which the Mucinex helped.  The cough is nonproductive.  Patient reports no chest pain, no palpitations.  She has had no  fatigue or weakness and reports she is always on the go.  She has no sick contacts.  Patient has no other complaints at this time.   Patient is up walking around upon the beginning of my exam.  She may be able to be discharged today.   Patient does not smoke, does not drink, does not use illicit drugs.  She is vaccinated for COVID.  Patient is DNR.  Assessment and Plan: * CAP (community acquired pneumonia) - CT scan shows possible pneumonia and now presents for pulmonary embolism. -Patient reports no nausea, no vomiting and no fever. -Initially was requiring 2 L nasal cannula supplementation but eventually able to be weaned off and no requiring any oxygen to maintain saturation above 90-92%. -Patient would like to go home. -Resumption of Flonase, daily Claritin, Mucinex/dextromethorphan and oral doxycycline has been prescribed to control patient's symptoms of early pneumonia/bronchitis. -Outpatient repeat x-ray recommended to assure complete resolution of infiltrate. Prescription for Combivent inhaler to assist with any presentation of shortness of breath or wheezing has been provided at time of discharge. -Patient advised to maintain adequate hydration and take medications as prescribed.  Acute respiratory failure with hypoxia (HCC) - Continue treatment as per community-acquired pneumonia/bronchitis -Patient not requiring oxygen supplementation at time of discharge.  Diabetes mellitus type 2 in nonobese (HCC) - Resume home hypoglycemic regimen -Close monitoring of patient's CBGs/A1c recommended to further adjust therapy. -Modified carbohydrate diet discussed with patient.  History of deep vein thrombosis (DVT) of lower extremity -  No pulmonary embolus seen appreciated on patient's CT angiogram of the chest. -Continue Eliquis twice a day as previously prescribed for secondary prevention.  Seizures (HCC) - Continue Keppra -No seizure activity appreciated throughout  hospitalization. -Continue outpatient follow-up with neurology service.  Essential hypertension, benign - Resume home antihypertensive agents -Heart healthy/low-sodium diet discussed with patient.  Hyperlipidemia associated with type 2 diabetes mellitus (HCC) - Continue Lipitor -Heart healthy diet discussed with patient.   Consultants: None Procedures performed: See below for x-ray report. Disposition: Home Diet recommendation: Heart healthy/modified carbohydrate diet discussed with patient.  DISCHARGE MEDICATION: Allergies as of 06/13/2022       Reactions   Heparin Nausea And Vomiting   Warfarin Other (See Comments)   unknown   Penicillins Rash        Medication List     STOP taking these medications    OVER THE COUNTER MEDICATION       TAKE these medications    amLODipine 5 MG tablet Commonly known as: NORVASC Take 1 tablet (5 mg total) by mouth daily.   apixaban 5 MG Tabs tablet Commonly known as: Eliquis TAKE 1 TABLET BY MOUTH TWICE DAILY . APPOINTMENT REQUIRED FOR FUTURE REFILLS   atorvastatin 20 MG tablet Commonly known as: LIPITOR Take 1 tablet (20 mg total) by mouth daily.   blood glucose meter kit and supplies Dispense based on patient and insurance preference. Use up to four times daily as directed. (FOR ICD-10 E10.9, E11.9).   Blood Glucose Monitoring Suppl Devi 1 each by Does not apply route in the morning, at noon, and at bedtime. May substitute to any manufacturer covered by patient's insurance.   cyanocobalamin 1000 MCG tablet Commonly known as: VITAMIN B12 Take 2 tablets (2,000 mcg total) by mouth daily.   dapagliflozin propanediol 10 MG Tabs tablet Commonly known as: Farxiga Take 1 tablet (10 mg total) by mouth daily before breakfast.   dextromethorphan-guaiFENesin 30-600 MG 12hr tablet Commonly known as: MUCINEX DM Take 1 tablet by mouth 2 (two) times daily for 7 days.   doxycycline 100 MG tablet Commonly known as:  VIBRA-TABS Take 1 tablet (100 mg total) by mouth every 12 (twelve) hours for 5 days.   fluticasone 50 MCG/ACT nasal spray Commonly known as: FLONASE Place 2 sprays into both nostrils daily.   Ipratropium-Albuterol 20-100 MCG/ACT Aers respimat Commonly known as: COMBIVENT Inhale 1 puff into the lungs every 8 (eight) hours as needed for wheezing or shortness of breath.   levETIRAcetam 500 MG tablet Commonly known as: KEPPRA Take 500 mg by mouth 2 (two) times daily.   lisinopril 40 MG tablet Commonly known as: ZESTRIL Take 1 tablet (40 mg total) by mouth daily.   loratadine 10 MG tablet Commonly known as: Claritin Take 1 tablet (10 mg total) by mouth daily.   Multiple Vitamins tablet Take 1 tablet by mouth. Reported on 05/26/2015   OneTouch Delica Plus Lancet33G Misc Check twice a day and as needed What changed: additional instructions   OneTouch Ultra test strip Generic drug: glucose blood 1 each by Other route 4 (four) times daily.   Rybelsus 3 MG Tabs Generic drug: Semaglutide Take 1 tablet (3 mg total) by mouth daily.   Vitamin D (Ergocalciferol) 1.25 MG (50000 UNIT) Caps capsule Commonly known as: DRISDOL Take 1 capsule (50,000 Units total) by mouth once a week.   zolpidem 5 MG tablet Commonly known as: AMBIEN Take 1 tablet (5 mg total) by mouth at bedtime as needed for sleep.  Follow-up Information     Junie Spencer, FNP. Schedule an appointment as soon as possible for a visit in 10 day(s).   Specialty: Family Medicine Contact information: [address] [place] [address] [phone]                Discharge Exam: Ceasar Mons Weights   06/12/22 1758 06/12/22 2238  Weight: 72.2 kg 72.1 kg   General exam: Alert, awake, oriented x 3; in no acute distress and wanting to go home. Respiratory system: Clear to auscultation. Respiratory effort normal.  Good saturation on room air appreciated at time of discharge. Cardiovascular  system:RRR. No rubs or gallops; no JVD. Gastrointestinal system: Abdomen is nondistended, soft and nontender. No organomegaly or masses felt. Normal bowel sounds heard. Central nervous system: Alert and oriented. No focal neurological deficits. Extremities: No C/C/E, +pedal pulses Skin: No rashes, lesions or ulcers Psychiatry: Judgement and insight appear normal. Mood & affect appropriate.    Condition at discharge: Stable and improved.l  The results of significant diagnostics from this hospitalization (including imaging, microbiology, ancillary and laboratory) are listed below for reference.   Imaging Studies: CT Angio Chest PE W and/or Wo Contrast  Result Date: 06/12/2022 CLINICAL DATA:  Dyspnea cough EXAM: CT ANGIOGRAPHY CHEST WITH CONTRAST TECHNIQUE: Multidetector CT imaging of the chest was performed using the standard protocol during bolus administration of intravenous contrast. Multiplanar CT image reconstructions and MIPs were obtained to evaluate the vascular anatomy. RADIATION DOSE REDUCTION: This exam was performed according to the departmental dose-optimization program which includes automated exposure control, adjustment of the mA and/or kV according to patient size and/or use of iterative reconstruction technique. CONTRAST:  75mL OMNIPAQUE IOHEXOL 350 MG/ML SOLN COMPARISON:  Chest x-ray 06/12/2022, CT 02/16/2019 FINDINGS: Cardiovascular: Satisfactory opacification of the pulmonary arteries to the segmental level. No evidence of pulmonary embolism. Nonaneurysmal aorta. Mild atherosclerosis. No dissection. Mild coronary vascular calcification. Normal cardiac size. No pericardial effusion Mediastinum/Nodes: No enlarged mediastinal, hilar, or axillary lymph nodes. Thyroid gland, trachea, and esophagus demonstrate no significant findings. Lungs/Pleura: No consolidation or effusion. 5 mm left apical lung nodule, stable, no imaging follow-up is recommended. Minimal tree-in-bud density at the  right middle lobe on series 6, image 88, consistent with respiratory infection Upper Abdomen: No acute abnormality. Musculoskeletal: No chest wall abnormality. No acute or significant osseous findings. Review of the MIP images confirms the above findings. IMPRESSION: 1. Negative for acute pulmonary embolus or aortic dissection. 2. Minimal tree-in-bud density at the right middle lobe consistent with respiratory infection. Aortic Atherosclerosis (ICD10-I70.0). Electronically Signed   By: Jasmine Pang M.D.   On: 06/12/2022 20:01   DG Chest 2 View  Result Date: 06/12/2022 CLINICAL DATA:  Dyspnea, cough EXAM: CHEST - 2 VIEW COMPARISON:  Chest radiograph 02/06/2022. FINDINGS: The cardiomediastinal silhouette is normal There is no focal consolidation or pulmonary edema. There is no pleural effusion or pneumothorax There is no acute osseous abnormality. IMPRESSION: No radiographic evidence of acute cardiopulmonary process. Electronically Signed   By: Lesia Hausen M.D.   On: 06/12/2022 16:33    Microbiology: Results for orders placed or performed during the hospital encounter of 06/12/22  Resp panel by RT-PCR (RSV, Flu A&B, Covid) Anterior Nasal Swab     Status: None   Collection Time: 06/12/22  6:41 PM   Specimen: Anterior Nasal Swab  Result Value Ref Range Status   SARS Coronavirus 2 by RT PCR NEGATIVE NEGATIVE Final    Comment: (NOTE) SARS-CoV-2 target nucleic acids are  NOT DETECTED.  The SARS-CoV-2 RNA is generally detectable in upper respiratory specimens during the acute phase of infection. The lowest concentration of SARS-CoV-2 viral copies this assay can detect is 138 copies/mL. A negative result does not preclude SARS-Cov-2 infection and should not be used as the sole basis for treatment or other patient management decisions. A negative result may occur with  improper specimen collection/handling, submission of specimen other than nasopharyngeal swab, presence of viral mutation(s) within  the areas targeted by this assay, and inadequate number of viral copies(<138 copies/mL). A negative result must be combined with clinical observations, patient history, and epidemiological information. The expected result is Negative.  Fact Sheet for Patients:  BloggerCourse.com  Fact Sheet for Healthcare Providers:  SeriousBroker.it  This test is no t yet approved or cleared by the [place] FDA and  has been authorized for detection and/or diagnosis of SARS-CoV-2 by FDA under an Emergency Use Authorization (EUA). This EUA will remain  in effect (meaning this test can be used) for the duration of the COVID-19 declaration under Section 564(b)(1) of the Act, 21 U.S.C.section 360bbb-3(b)(1), unless the authorization is terminated  or revoked sooner.       Influenza A by PCR NEGATIVE NEGATIVE Final   Influenza B by PCR NEGATIVE NEGATIVE Final    Comment: (NOTE) The Xpert Xpress SARS-CoV-2/FLU/RSV plus assay is intended as an aid in the diagnosis of influenza from Nasopharyngeal swab specimens and should not be used as a sole basis for treatment. Nasal washings and aspirates are unacceptable for Xpert Xpress SARS-CoV-2/FLU/RSV testing.  Fact Sheet for Patients: BloggerCourse.com  Fact Sheet for Healthcare Providers: SeriousBroker.it  This test is not yet approved or cleared by the [place] FDA and has been authorized for detection and/or diagnosis of SARS-CoV-2 by FDA under an Emergency Use Authorization (EUA). This EUA will remain in effect (meaning this test can be used) for the duration of the COVID-19 declaration under Section 564(b)(1) of the Act, 21 U.S.C. section 360bbb-3(b)(1), unless the authorization is terminated or revoked.     Resp Syncytial Virus by PCR NEGATIVE NEGATIVE Final    Comment: (NOTE) Fact Sheet for  Patients: BloggerCourse.com  Fact Sheet for Healthcare Providers: SeriousBroker.it  This test is not yet approved or cleared by the [place] FDA and has been authorized for detection and/or diagnosis of SARS-CoV-2 by FDA under an Emergency Use Authorization (EUA). This EUA will remain in effect (meaning this test can be used) for the duration of the COVID-19 declaration under Section 564(b)(1) of the Act, 21 U.S.C. section 360bbb-3(b)(1), unless the authorization is terminated or revoked.  Performed at [facility], [address] [place], [place] [address]   Culture, blood (routine x 2)     Status: None (Preliminary result)   Collection Time: 06/12/22  8:53 PM   Specimen: BLOOD  Result Value Ref Range Status   Specimen Description BLOOD BLOOD LEFT ARM  Final   Special Requests   Final    BOTTLES DRAWN AEROBIC AND ANAEROBIC Blood Culture adequate volume   Culture   Final    NO GROWTH < 12 HOURS Performed at [facility], [address] [place], [place] [address]    Report Status PENDING  Incomplete  Culture, blood (routine x 2)     Status: None (Preliminary result)   Collection Time: 06/12/22  8:53 PM   Specimen: BLOOD  Result Value Ref Range Status   Specimen Description BLOOD BLOOD LEFT HAND  Final   Special Requests  Final    BOTTLES DRAWN AEROBIC AND ANAEROBIC Blood Culture adequate volume   Culture   Final    NO GROWTH < 12 HOURS Performed at [facility], [address], [place], [place] [address]    Report Status PENDING  Incomplete    Labs: CBC: Recent Labs  Lab 06/12/22 1844 06/13/22 0400  WBC 6.1 7.5  NEUTROABS 3.0 3.3  HGB 14.9 14.2  HCT 44.4 42.5  MCV 87.2 87.8  PLT 159 166   Basic Metabolic Panel: Recent Labs  Lab 06/12/22 1844 06/13/22 0400  NA 137 137  K 3.7 4.6  CL 103 104  CO2 24 25  GLUCOSE 102* 120*  BUN 15 16  CREATININE 0.59 0.58  CALCIUM 9.2 9.1  MG  --  2.3   Liver  Function Tests: Recent Labs  Lab 06/13/22 0400  AST 47*  ALT 63*  ALKPHOS 29*  BILITOT 0.6  PROT 7.4  ALBUMIN 3.7   CBG: Recent Labs  Lab 06/13/22 0724 06/13/22 1123  GLUCAP 123* 114*    Discharge time spent: greater than 30 minutes.  Signed: Vassie Loll, MD [facility] 06/13/2022

## 2022-06-13 NOTE — Assessment & Plan Note (Addendum)
-  Continue Lipitor -Heart healthy diet discussed with patient. 

## 2022-06-13 NOTE — Assessment & Plan Note (Addendum)
-   CT scan shows possible pneumonia and now presents for pulmonary embolism. -Patient reports no nausea, no vomiting and no fever. -Initially was requiring 2 L nasal cannula supplementation but eventually able to be weaned off and no requiring any oxygen to maintain saturation above 90-92%. -Patient would like to go home. -Resumption of Flonase, daily Claritin, Mucinex/dextromethorphan and oral doxycycline has been prescribed to control patient's symptoms of early pneumonia/bronchitis. -Outpatient repeat x-ray recommended to assure complete resolution of infiltrate. Prescription for Combivent inhaler to assist with any presentation of shortness of breath or wheezing has been provided at time of discharge. -Patient advised to maintain adequate hydration and take medications as prescribed.

## 2022-06-13 NOTE — Progress Notes (Signed)
  Transition of Care [facility]) Screening Note   Patient Details  Name: Christina Gonzales Date of Birth: 02/04/1946   Transition of Care [facility]) CM/SW Contact:    Annice Needy, LCSW Phone Number: 06/13/2022, 10:46 AM    Transition of Care Department [facility]) has reviewed patient and no TOC needs have been identified at this time. We will continue to monitor patient advancement through interdisciplinary progression rounds. If new patient transition needs arise, please place a TOC consult.

## 2022-06-13 NOTE — H&P (Signed)
History and Physical    Patient: Christina Gonzales GMW:[id number] DOB: 06/24/1945 DOA: 06/12/2022 DOS: the patient was seen and examined on 06/13/2022 PCP: Junie Spencer, FNP  Patient coming from: Home  Chief Complaint:  Chief Complaint  Patient presents with   Shortness of Breath   HPI: Christina Gonzales is a 77 y.o. female with medical history significant of history of anxiety, DVT, hyperlipidemia, seizures presents to the ED with a chief complaint of dyspnea.  Patient reports that she had onset of URI symptoms 2 days ago.  She called her PCP who advised her to take Mucinex.  After couple of days and Mucinex she still felt no better, so she went to her PCP.  She reports she became very dyspneic there, and she told them of her nonadherence to Eliquis and they advised her to come to the ER to rule out a PE.  Patient reports that Eliquis is very expensive, sensitive taking it twice a day she tries to take it once a day to extend the prescription.  She reports understanding that this is not safe practice, and reports that she is going to take it as prescribed from now on.  Patient reports she had a subjective fever at home.  She had a cough which the Mucinex helped.  The cough is nonproductive.  Patient reports no chest pain, no palpitations.  She has had no fatigue or weakness and reports she is always on the go.  She has no sick contacts.  Patient has no other complaints at this time.  Patient is up walking around upon the beginning of my exam.  She may be able to be discharged today.  Patient does not smoke, does not drink, does not use illicit drugs.  She is vaccinated for COVID.  Patient is DNR. Review of Systems: As mentioned in the history of present illness. All other systems reviewed and are negative. Past Medical History:  Diagnosis Date   Anxiety    DVT (deep venous thrombosis) (HCC)    Hyperlipidemia    Seizures (HCC)    Past Surgical History:  Procedure Laterality Date    TUMOR EXCISION     Behind left eye   Social History:  reports that she has never smoked. She has never used smokeless tobacco. She reports that she does not drink alcohol and does not use drugs.  Allergies  Allergen Reactions   Heparin Nausea And Vomiting   Warfarin Other (See Comments)    unknown   Penicillins Rash    Family History  Problem Relation Age of Onset   Cancer Mother        lung   Diabetes Brother     Prior to Admission medications   Medication Sig Start Date End Date Taking? Authorizing Provider  amLODipine (NORVASC) 5 MG tablet Take 1 tablet (5 mg total) by mouth daily. 04/09/22  Yes Hawks, Christy A, FNP  apixaban (ELIQUIS) 5 MG TABS tablet TAKE 1 TABLET BY MOUTH TWICE DAILY . APPOINTMENT REQUIRED FOR FUTURE REFILLS 03/12/22  Yes Rojelio Brenner M, PA-C  atorvastatin (LIPITOR) 20 MG tablet Take 1 tablet (20 mg total) by mouth daily. 04/09/22  Yes Hawks, Christy A, FNP  cyanocobalamin (VITAMIN B12) 1000 MCG tablet Take 2 tablets (2,000 mcg total) by mouth daily. 05/23/22  Yes Pennington, Rebekah M, PA-C  dapagliflozin propanediol (FARXIGA) 10 MG TABS tablet Take 1 tablet (10 mg total) by mouth daily before breakfast. 04/17/22  Yes Hawks, Edilia Bo, FNP  fluticasone (  FLONASE) 50 MCG/ACT nasal spray Place 2 sprays into both nostrils daily. 01/15/22  Yes Hawks, Christy A, FNP  levETIRAcetam (KEPPRA) 500 MG tablet Take 500 mg by mouth 2 (two) times daily. 11/30/20  Yes [provider]  lisinopril (ZESTRIL) 40 MG tablet Take 1 tablet (40 mg total) by mouth daily. 04/09/22  Yes Hawks, Neysa Bonito A, FNP  Multiple Vitamins tablet Take 1 tablet by mouth. Reported on 05/26/2015   Yes [provider]  OVER THE COUNTER MEDICATION Tumeric for inflammation.   Yes [provider]  Semaglutide (RYBELSUS) 3 MG TABS Take 1 tablet (3 mg total) by mouth daily. 04/23/22  Yes Hawks, Christy A, FNP  Vitamin D, Ergocalciferol, (DRISDOL) 1.25 MG (50000 UNIT) CAPS capsule Take 1  capsule (50,000 Units total) by mouth once a week. 05/31/21  Yes Hawks, Christy A, FNP  zolpidem (AMBIEN) 5 MG tablet Take 1 tablet (5 mg total) by mouth at bedtime as needed for sleep. 04/09/22  Yes Hawks, Edilia Bo, FNP  blood glucose meter kit and supplies Dispense based on patient and insurance preference. Use up to four times daily as directed. (FOR ICD-10 E10.9, E11.9). 07/26/19   Jannifer Rodney A, FNP  Blood Glucose Monitoring Suppl DEVI 1 each by Does not apply route in the morning, at noon, and at bedtime. May substitute to any manufacturer covered by patient's insurance. 04/10/22   Junie Spencer, FNP  Lancets [facility] DELICA PLUS [place]) MISC Check twice a day and as needed Patient taking differently: Check twice daily as needed 12/14/19   Junie Spencer, FNP  [facility] ULTRA test strip 1 each by Other route 4 (four) times daily. 12/14/19   Junie Spencer, FNP    Physical Exam: Vitals:   06/12/22 2200 06/12/22 2238 06/12/22 2243 06/13/22 0403  BP:   (!) 159/71 (!) 150/62  Pulse: 100  (!) 101 95  Resp: 17  20 18   Temp:   97.7 F (36.5 C) 98.2 F (36.8 C)  TempSrc:   Oral   SpO2: 92%  92% 96%  Weight:  72.1 kg    Height:  5\' 2"  (1.575 m)     1.  General: Patient lying supine in bed,  no acute distress   2. Psychiatric: Alert and oriented x 3, mood and behavior normal for situation, pleasant and cooperative with exam   3. Neurologic: Speech and language are normal, face is symmetric, moves all 4 extremities voluntarily, at baseline without acute deficits on limited exam   4. HEENMT:  Head is atraumatic, normocephalic, pupils reactive to light, neck is supple, trachea is midline, mucous membranes are moist   5. Respiratory : Lungs are clear to auscultation bilaterally without wheezing, rhonchi, rales, no cyanosis, no increase in work of breathing or accessory muscle use   6. Cardiovascular : Heart rate slightly tachycardic, rhythm is regular, no murmurs, rubs or  gallops, no peripheral edema, peripheral pulses palpated   7. Gastrointestinal:  Abdomen is soft, nondistended, nontender to palpation bowel sounds active, no masses or organomegaly palpated   8. Skin:  Skin is warm, dry and intact without rashes, acute lesions, or ulcers on limited exam   9.Musculoskeletal:  No acute deformities or trauma, no asymmetry in tone, no peripheral edema, peripheral pulses palpated, no tenderness to palpation in the extremities  Data Reviewed: In the ED Temp 98.5, heart rate 80-94, respiratory rate 16-23, blood pressure 149/69-185/73, satting 85 on room air and 98 on 2 L nasal cannula No leukocytosis  with a white blood cell count of 6.1, hemoglobin 14.9, platelets 159 Chemistry is unremarkable BNP 9.0 Negative COVID and flu CTA shows negative for acute pulmonary embolus or aortic dissection.  Possible pneumonia, procalcitonin has come back undetectable  EKG shows a heart rate of 81, sinus rhythm, QTc 4 and 31 Patient was started on Zithromax, Rocephin, and given a DuoNeb in the ED Admission requested for hypoxia secondary to community-acquired pneumonia  Assessment and Plan: * CAP (community acquired pneumonia) - CT scan shows possible pneumonia - Patient was hypoxic down to 85% requiring 2 L nasal cannula - Reports fever and cough - Started on Zithromax and Rocephin, continue - Sputum culture, Legionella and strep urine antigens - Patient may be able to discharge today  Update* -Procalcitonin has come back undetectable, no leukocytosis, negative COVID and flu - Discontinuing Rocephin and Zithromax - Likely a viral infection other than COVID, flu  Acute respiratory failure with hypoxia (HCC) - Down to 85% on room air - Normalized on 2 L nasal cannula - Since treatment in the ER, patient is improved and is now maintaining oxygen sats on room air - CTA ruled out PE - Likely secondary to community-acquired pneumonia - Continue treatment as per  community-acquired pneumonia  Diabetes mellitus type 2 in nonobese (HCC) - Continue Farxiga - Last hemoglobin A1c 10.6 - Sliding scale coverage - Continue to monitor  History of deep vein thrombosis (DVT) of lower extremity - Reports nonadherence to Eliquis due to cost - CTA ruled out PE - Counseled on importance of adherence to medical regimen versus changing to a more cost effective anticoagulant like warfarin - Patient is not interested in warfarin - Continue to monitor  Seizures (HCC) - Continue Keppra  Essential hypertension, benign - Continue Norvasc, lisinopril - Continue to monitor  Hyperlipidemia associated with type 2 diabetes mellitus (HCC) - Continue Lipitor      Advance Care Planning:   Code Status: DNR  Consults: None at this time  Family Communication: No family at bedside  Severity of Illness: The appropriate patient status for this patient is OBSERVATION. Observation status is judged to be reasonable and necessary in order to provide the required intensity of service to ensure the patient's safety. The patient's presenting symptoms, physical exam findings, and initial radiographic and laboratory data in the context of their medical condition is felt to place them at decreased risk for further clinical deterioration. Furthermore, it is anticipated that the patient will be medically stable for discharge from the hospital within 2 midnights of admission.   Author: Lilyan Gilford, DO 06/13/2022 6:23 AM  For on call review [email or web address].

## 2022-06-13 NOTE — Assessment & Plan Note (Addendum)
-  Resume home antihypertensive agents. -Heart healthy/low-sodium diet discussed with patient. 

## 2022-06-13 NOTE — Plan of Care (Signed)
Problem: Activity: Goal: Ability to tolerate increased activity will improve Outcome: Adequate for Discharge   Problem: Clinical Measurements: Goal: Ability to maintain a body temperature in the normal range will improve Outcome: Adequate for Discharge   Problem: Respiratory: Goal: Ability to maintain adequate ventilation will improve Outcome: Adequate for Discharge Goal: Ability to maintain a clear airway will improve Outcome: Adequate for Discharge   Problem: Activity: Goal: Ability to tolerate increased activity will improve Outcome: Adequate for Discharge   Problem: Clinical Measurements: Goal: Ability to maintain a body temperature in the normal range will improve Outcome: Adequate for Discharge   Problem: Respiratory: Goal: Ability to maintain adequate ventilation will improve Outcome: Adequate for Discharge Goal: Ability to maintain a clear airway will improve Outcome: Adequate for Discharge   Problem: Education: Goal: Knowledge of General Education information will improve Description: Including pain rating scale, medication(s)/side effects and non-pharmacologic comfort measures Outcome: Adequate for Discharge   Problem: Health Behavior/Discharge Planning: Goal: Ability to manage health-related needs will improve Outcome: Adequate for Discharge   Problem: Clinical Measurements: Goal: Ability to maintain clinical measurements within normal limits will improve Outcome: Adequate for Discharge Goal: Will remain free from infection Outcome: Adequate for Discharge Goal: Diagnostic test results will improve Outcome: Adequate for Discharge Goal: Respiratory complications will improve Outcome: Adequate for Discharge Goal: Cardiovascular complication will be avoided Outcome: Adequate for Discharge   Problem: Activity: Goal: Risk for activity intolerance will decrease Outcome: Adequate for Discharge   Problem: Nutrition: Goal: Adequate nutrition will be  maintained Outcome: Adequate for Discharge   Problem: Coping: Goal: Level of anxiety will decrease Outcome: Adequate for Discharge   Problem: Elimination: Goal: Will not experience complications related to bowel motility Outcome: Adequate for Discharge Goal: Will not experience complications related to urinary retention Outcome: Adequate for Discharge   Problem: Pain Managment: Goal: General experience of comfort will improve Outcome: Adequate for Discharge   Problem: Safety: Goal: Ability to remain free from injury will improve Outcome: Adequate for Discharge   Problem: Skin Integrity: Goal: Risk for impaired skin integrity will decrease Outcome: Adequate for Discharge   Problem: Education: Goal: Knowledge of General Education information will improve Description: Including pain rating scale, medication(s)/side effects and non-pharmacologic comfort measures Outcome: Adequate for Discharge   Problem: Health Behavior/Discharge Planning: Goal: Ability to manage health-related needs will improve Outcome: Adequate for Discharge   Problem: Clinical Measurements: Goal: Ability to maintain clinical measurements within normal limits will improve Outcome: Adequate for Discharge Goal: Will remain free from infection Outcome: Adequate for Discharge Goal: Diagnostic test results will improve Outcome: Adequate for Discharge Goal: Respiratory complications will improve Outcome: Adequate for Discharge Goal: Cardiovascular complication will be avoided Outcome: Adequate for Discharge   Problem: Activity: Goal: Risk for activity intolerance will decrease Outcome: Adequate for Discharge   Problem: Nutrition: Goal: Adequate nutrition will be maintained Outcome: Adequate for Discharge   Problem: Coping: Goal: Level of anxiety will decrease Outcome: Adequate for Discharge   Problem: Elimination: Goal: Will not experience complications related to bowel motility Outcome: Adequate  for Discharge Goal: Will not experience complications related to urinary retention Outcome: Adequate for Discharge   Problem: Pain Managment: Goal: General experience of comfort will improve Outcome: Adequate for Discharge   Problem: Safety: Goal: Ability to remain free from injury will improve Outcome: Adequate for Discharge   Problem: Skin Integrity: Goal: Risk for impaired skin integrity will decrease Outcome: Adequate for Discharge   Problem: Education: Goal: Ability to describe self-care measures that may  prevent or decrease complications (Diabetes Survival Skills Education) will improve Outcome: Adequate for Discharge Goal: Individualized Educational Video(s) Outcome: Adequate for Discharge   Problem: Coping: Goal: Ability to adjust to condition or change in health will improve Outcome: Adequate for Discharge   Problem: Fluid Volume: Goal: Ability to maintain a balanced intake and output will improve Outcome: Adequate for Discharge   Problem: Health Behavior/Discharge Planning: Goal: Ability to identify and utilize available resources and services will improve Outcome: Adequate for Discharge Goal: Ability to manage health-related needs will improve Outcome: Adequate for Discharge   Problem: Metabolic: Goal: Ability to maintain appropriate glucose levels will improve Outcome: Adequate for Discharge

## 2022-06-14 LAB — CULTURE, BLOOD (ROUTINE X 2): Special Requests: ADEQUATE

## 2022-06-14 LAB — LEGIONELLA PNEUMOPHILA SEROGP 1 UR AG: L. pneumophila Serogp 1 Ur Ag: NEGATIVE

## 2022-06-15 LAB — CULTURE, BLOOD (ROUTINE X 2)

## 2022-06-16 LAB — CULTURE, BLOOD (ROUTINE X 2): Special Requests: ADEQUATE

## 2022-06-17 LAB — CULTURE, BLOOD (ROUTINE X 2)
Culture: NO GROWTH
Culture: NO GROWTH

## 2022-06-27 ENCOUNTER — Encounter: Payer: Self-pay | Admitting: Family

## 2022-06-27 ENCOUNTER — Ambulatory Visit (INDEPENDENT_AMBULATORY_CARE_PROVIDER_SITE_OTHER): Payer: Medicare HMO | Admitting: Family

## 2022-06-27 VITALS — BP 138/70 | HR 86 | Temp 97.0°F | Ht 62.0 in | Wt 160.4 lb

## 2022-06-27 DIAGNOSIS — E119 Type 2 diabetes mellitus without complications: Secondary | ICD-10-CM | POA: Diagnosis not present

## 2022-06-27 DIAGNOSIS — J189 Pneumonia, unspecified organism: Secondary | ICD-10-CM

## 2022-06-27 DIAGNOSIS — F320 Major depressive disorder, single episode, mild: Secondary | ICD-10-CM

## 2022-06-27 DIAGNOSIS — F5101 Primary insomnia: Secondary | ICD-10-CM

## 2022-06-27 DIAGNOSIS — F411 Generalized anxiety disorder: Secondary | ICD-10-CM | POA: Diagnosis not present

## 2022-06-27 DIAGNOSIS — R399 Unspecified symptoms and signs involving the genitourinary system: Secondary | ICD-10-CM | POA: Diagnosis not present

## 2022-06-27 DIAGNOSIS — R569 Unspecified convulsions: Secondary | ICD-10-CM | POA: Diagnosis not present

## 2022-06-27 DIAGNOSIS — E1169 Type 2 diabetes mellitus with other specified complication: Secondary | ICD-10-CM

## 2022-06-27 DIAGNOSIS — I10 Essential (primary) hypertension: Secondary | ICD-10-CM | POA: Diagnosis not present

## 2022-06-27 DIAGNOSIS — E785 Hyperlipidemia, unspecified: Secondary | ICD-10-CM

## 2022-06-27 DIAGNOSIS — E669 Obesity, unspecified: Secondary | ICD-10-CM

## 2022-06-27 DIAGNOSIS — Z09 Encounter for follow-up examination after completed treatment for conditions other than malignant neoplasm: Secondary | ICD-10-CM

## 2022-06-27 LAB — URINALYSIS, COMPLETE
Bilirubin, UA: NEGATIVE
Ketones, UA: NEGATIVE
Nitrite, UA: NEGATIVE
Protein,UA: NEGATIVE
RBC, UA: NEGATIVE
Specific Gravity, UA: 1.02 (ref 1.005–1.030)
Urobilinogen, Ur: 0.2 mg/dL (ref 0.2–1.0)
pH, UA: 5.5 (ref 5.0–7.5)

## 2022-06-27 LAB — CMP14+EGFR
ALT: 67 IU/L — ABNORMAL HIGH (ref 0–32)
AST: 52 IU/L — ABNORMAL HIGH (ref 0–40)
Albumin/Globulin Ratio: 1.7 (ref 1.2–2.2)
Albumin: 4.3 g/dL (ref 3.8–4.8)
Alkaline Phosphatase: 35 IU/L — ABNORMAL LOW (ref 44–121)
BUN/Creatinine Ratio: 37 — ABNORMAL HIGH (ref 12–28)
BUN: 24 mg/dL (ref 8–27)
Bilirubin Total: 0.4 mg/dL (ref 0.0–1.2)
CO2: 23 mmol/L (ref 20–29)
Calcium: 10.5 mg/dL — ABNORMAL HIGH (ref 8.7–10.3)
Chloride: 103 mmol/L (ref 96–106)
Creatinine, Ser: 0.65 mg/dL (ref 0.57–1.00)
Globulin, Total: 2.6 g/dL (ref 1.5–4.5)
Glucose: 102 mg/dL — ABNORMAL HIGH (ref 70–99)
Potassium: 4.6 mmol/L (ref 3.5–5.2)
Sodium: 140 mmol/L (ref 134–144)
Total Protein: 6.9 g/dL (ref 6.0–8.5)
eGFR: 91 mL/min/{1.73_m2} (ref >59)

## 2022-06-27 LAB — MICROSCOPIC EXAMINATION
RBC, Urine: NONE SEEN /hpf (ref 0–2)
Renal Epithel, UA: NONE SEEN /hpf

## 2022-06-27 LAB — BAYER DCA HB A1C WAIVED: HB A1C (BAYER DCA - WAIVED): 7.3 % — ABNORMAL HIGH (ref 4.8–5.6)

## 2022-06-27 MED ORDER — FLUCONAZOLE 150 MG PO TABS: 150.0000 mg | ORAL_TABLET | ORAL | 0 refills | Status: DC | PRN

## 2022-06-27 NOTE — Addendum Note (Signed)
Addended by: Jannifer Rodney A on: 06/27/2022 11:08 AM   Modules accepted: Orders

## 2022-06-27 NOTE — Patient Instructions (Signed)

## 2022-06-27 NOTE — Progress Notes (Signed)
Subjective:    Patient ID: Christina Gonzales, female    DOB: [birth year], 77 y.o.   MRN: [id number]  Chief Complaint  Patient presents with   Diabetes   Pt presents to the office today with chronic follow up and joint pain. She is followed by Neurologists for seizures.     She takes Eliquis 5 mg for hx of DVT.   She had CAP and was hospitalized 06/12/22.  She completed all her antibiotics and feeling much better.  Diabetes She presents for her follow-up diabetic visit. She has type 2 diabetes mellitus. Associated symptoms include fatigue. Pertinent negatives for diabetes include no blurred vision and no foot paresthesias. Symptoms are stable. Risk factors for coronary artery disease include dyslipidemia, diabetes mellitus, hypertension, sedentary lifestyle and post-menopausal. She is following a generally healthy diet. Her overall blood glucose range is 110-130 mg/dl. Eye exam is not current.  Hypertension This is a chronic problem. The current episode started more than 1 year ago. The problem has been resolved since onset. The problem is controlled. Associated symptoms include anxiety. Pertinent negatives include no blurred vision, malaise/fatigue, peripheral edema or shortness of breath. Risk factors for coronary artery disease include dyslipidemia, diabetes mellitus, obesity and sedentary lifestyle. The current treatment provides moderate improvement.  Hyperlipidemia This is a chronic problem. The current episode started more than 1 year ago. Pertinent negatives include no shortness of breath. Current antihyperlipidemic treatment includes statins. The current treatment provides moderate improvement of lipids. Risk factors for coronary artery disease include diabetes mellitus, dyslipidemia, hypertension, a sedentary lifestyle and post-menopausal.  Anxiety Presents for follow-up visit. Symptoms include depressed mood and excessive worry. Patient reports no nausea or shortness of breath.  Symptoms occur most days. The severity of symptoms is moderate.    Depression        This is a chronic problem.  The current episode started more than 1 year ago.   The problem occurs intermittently.  Associated symptoms include fatigue and sad.  Associated symptoms include no helplessness and no hopelessness.  Past medical history includes anxiety.   Urinary Frequency  This is a new problem. The current episode started 1 to 4 weeks ago. The problem occurs intermittently. The problem has been waxing and waning. The pain is mild. Associated symptoms include frequency and urgency. Pertinent negatives include no flank pain, hematuria, hesitancy, nausea or vomiting. She has tried increased fluids for the symptoms. The treatment provided mild relief.  Vaginal Itching The patient's primary symptoms include genital itching. The patient's pertinent negatives include no genital odor. This is a chronic problem. The current episode started more than 1 year ago. The problem occurs intermittently. Associated symptoms include frequency and urgency. Pertinent negatives include no flank pain, hematuria, nausea or vomiting.      Review of Systems  Constitutional:  Positive for fatigue. Negative for malaise/fatigue.  Eyes:  Negative for blurred vision.  Respiratory:  Negative for shortness of breath.   Gastrointestinal:  Negative for nausea and vomiting.  Genitourinary:  Positive for frequency and urgency. Negative for flank pain, hematuria and hesitancy.  Psychiatric/Behavioral:  Positive for depression.   All other systems reviewed and are negative.      Objective:   Physical Exam Vitals reviewed.  Constitutional:      General: She is not in acute distress.    Appearance: She is well-developed.  HENT:     Head: Normocephalic and atraumatic.     Right Ear: Tympanic membrane normal.  Left Ear: Tympanic membrane normal.  Eyes:     Pupils: Pupils are equal, round, and reactive to light.  Neck:      Thyroid: No thyromegaly.  Cardiovascular:     Rate and Rhythm: Normal rate and regular rhythm.     Heart sounds: Normal heart sounds. No murmur heard. Pulmonary:     Effort: Pulmonary effort is normal. No respiratory distress.     Breath sounds: Normal breath sounds. No wheezing.  Abdominal:     General: Bowel sounds are normal. There is no distension.     Palpations: Abdomen is soft.     Tenderness: There is no abdominal tenderness.  Musculoskeletal:        General: No tenderness. Normal range of motion.     Cervical back: Normal range of motion and neck supple.  Skin:    General: Skin is warm and dry.  Neurological:     Mental Status: She is alert and oriented to person, place, and time.     Cranial Nerves: No cranial nerve deficit.     Deep Tendon Reflexes: Reflexes are normal and symmetric.  Psychiatric:        Behavior: Behavior normal.        Thought Content: Thought content normal.        Judgment: Judgment normal.         BP 138/70   Pulse 86   Temp (!) 97 F (36.1 C) (Temporal)   Ht 5\' 2"  (1.575 m)   Wt 160 lb 6.4 oz (72.8 kg)   SpO2 95%   BMI 29.34 kg/m   Assessment & Plan:   Christina Gonzales comes in today with chief complaint of Diabetes   Diagnosis and orders addressed:  1. UTI symptoms - Urinalysis, Complete - Urine Culture - CMP14+EGFR  2. Diabetes mellitus type 2 in nonobese (HCC) - Bayer DCA Hb A1c Waived - CMP14+EGFR - Microalbumin / creatinine urine ratio  3. GAD (generalized anxiety disorder) - CMP14+EGFR  4. Hyperlipidemia associated with type 2 diabetes mellitus (HCC) - CMP14+EGFR  5. Obesity (BMI 30-39.9) - CMP14+EGFR  6. Primary insomnia - CMP14+EGFR  7. Seizures (HCC)  - CMP14+EGFR  8. Essential hypertension, benign - CMP14+EGFR  9. Current mild episode of major depressive disorder without prior episode (HCC) - CMP14+EGFR  10. Community acquired pneumonia of right middle lobe of lung - DG Chest 2 View -  CMP14+EGFR  11. Hospital discharge follow-up - DG Chest 2 View - CMP14+EGFR   Labs pending Health Maintenance reviewed Diet and exercise encouraged  Follow up plan: 3 months   Jannifer Rodney, FNP

## 2022-06-28 LAB — URINE CULTURE

## 2022-06-28 LAB — MICROALBUMIN / CREATININE URINE RATIO
Creatinine, Urine: 62.8 mg/dL
Microalb/Creat Ratio: <5 mg/g creat (ref 0–29)
Microalbumin, Urine: <3 ug/mL

## 2022-07-20 ENCOUNTER — Other Ambulatory Visit: Payer: Self-pay | Admitting: Family

## 2022-07-25 ENCOUNTER — Telehealth: Payer: Self-pay | Admitting: Family

## 2022-07-25 DIAGNOSIS — I10 Essential (primary) hypertension: Secondary | ICD-10-CM

## 2022-07-25 DIAGNOSIS — E119 Type 2 diabetes mellitus without complications: Secondary | ICD-10-CM

## 2022-07-25 NOTE — Telephone Encounter (Signed)
Referral to clinical pharmacists for medication assistance.

## 2022-07-25 NOTE — Telephone Encounter (Signed)
Patient aware and verbalizes understanding. 

## 2022-07-25 NOTE — Telephone Encounter (Signed)
Patient calling because she is not able to afford dapagliflozin propanediol (FARXIGA) 10 MG TABS tablet and Semaglutide (RYBELSUS) 3 MG TABS. Wants to know what she needs to do. Please call back and advise.

## 2022-07-25 NOTE — Telephone Encounter (Signed)
No samples at this time tried to call patient NA. Left detailed message.

## 2022-07-29 ENCOUNTER — Telehealth: Payer: Self-pay

## 2022-07-29 NOTE — Progress Notes (Signed)
   Care Guide Note  07/29/2022 Name: SOPHIAH ROLIN MRN: [id number] DOB: [birth year]  Referred by: Junie Spencer, FNP Reason for referral : Care Coordination (Outreach to schedule with pharm d )   MICHALA DEBLANC is a 77 y.o. year old female who is a primary care patient of Junie Spencer, FNP. Forest Becker was referred to the pharmacist for assistance related to DM.    Successful contact was made with the patient to discuss pharmacy services including being ready for the pharmacist to call at least 5 minutes before the scheduled appointment time, to have medication bottles and any blood sugar or blood pressure readings ready for review. The patient agreed to meet with the pharmacist via with the pharmacist via telephone visit on (date/time).  08/20/2022  Penne Lash, RMA Care Guide [facility]  [place], [place] [address] Direct Dial: [phone] [email or web address]

## 2022-08-11 ENCOUNTER — Other Ambulatory Visit: Payer: Self-pay | Admitting: Physician Assistant

## 2022-08-11 DIAGNOSIS — Z86718 Personal history of other venous thrombosis and embolism: Secondary | ICD-10-CM

## 2022-08-20 ENCOUNTER — Telehealth: Payer: Medicare HMO

## 2022-08-21 ENCOUNTER — Telehealth: Payer: Self-pay

## 2022-08-21 NOTE — Progress Notes (Signed)
  Care Coordination Note  08/21/2022 Name: Christina Gonzales MRN: [id number] DOB: [birth year]  Christina Gonzales is a 77 y.o. year old female who is a primary care patient of Junie Spencer, FNP and is actively engaged with the Chronic Care Management team. I reached out to Forest Becker by phone today to assist with re-scheduling an initial visit with the Pharmacist  Follow up plan: Unsuccessful telephone outreach attempt made. A HIPAA compliant phone message was left for the patient providing contact information and requesting a return call.  The care management team will reach out to the patient again over the next 7 days.  If patient returns call to provider office, please advise to call CCM Care Guide Penne Lash  at [phone]  Penne Lash, RMA Care Guide [facility]  [place], [place] [address] Direct Dial: [phone] [email or web address]

## 2022-08-28 ENCOUNTER — Other Ambulatory Visit: Payer: Self-pay | Admitting: Family

## 2022-08-28 DIAGNOSIS — Z79899 Other long term (current) drug therapy: Secondary | ICD-10-CM

## 2022-08-28 DIAGNOSIS — F5101 Primary insomnia: Secondary | ICD-10-CM

## 2022-09-03 ENCOUNTER — Telehealth: Payer: Self-pay | Admitting: Family

## 2022-09-03 DIAGNOSIS — Z86718 Personal history of other venous thrombosis and embolism: Secondary | ICD-10-CM

## 2022-09-03 NOTE — Telephone Encounter (Signed)
Informed pt that we do not fill this medication, Dr. Buford Dresser does and he refilled this on 08/11/22 for a month's worth, she was not able to get the full script at that time due to cost, will check with the pharmacy.

## 2022-09-03 NOTE — Telephone Encounter (Signed)
  Prescription Request  09/03/2022  Is this a "Controlled Substance" medicine? ELIQUIS 5 MG TABS tablet   Have you seen your PCP in the last 2 weeks? 09/27/2022  If YES, route message to pool  -  If NO, patient needs to be scheduled for appointment.  What is the name of the medication or equipment? ELIQUIS 5 MG TABS tablet   Have you contacted your pharmacy to request a refill? New rx   Which pharmacy would you like this sent to? [facility] 30 day supply until apt.   Patient notified that their request is being sent to the clinical staff for review and that they should receive a response within 2 business days.

## 2022-09-04 NOTE — Progress Notes (Signed)
  Care Coordination Note  09/04/2022 Name: Christina Gonzales MRN: [id number] DOB: 04/12/1945  Christina Gonzales is a 77 y.o. year old female who is a primary care patient of Junie Spencer, FNP and is actively engaged with the Chronic Care Management team. I reached out to Forest Becker by phone today to assist with re-scheduling an initial visit with the Pharmacist  Follow up plan: Patient declines further follow up and engagement by the care management team. Appropriate care team members and provider have been notified via electronic communication.   Penne Lash, RMA Care Guide [facility]  [place], [place] [address] Direct Dial: [phone] [email or web address]

## 2022-09-04 NOTE — Telephone Encounter (Signed)
Patient declined pharmacy assistance just [facility]

## 2022-09-05 ENCOUNTER — Other Ambulatory Visit: Payer: Self-pay

## 2022-09-05 DIAGNOSIS — E538 Deficiency of other specified B group vitamins: Secondary | ICD-10-CM

## 2022-09-05 DIAGNOSIS — Z86718 Personal history of other venous thrombosis and embolism: Secondary | ICD-10-CM

## 2022-09-06 ENCOUNTER — Inpatient Hospital Stay: Payer: Medicare HMO | Attending: Hematology

## 2022-09-06 ENCOUNTER — Other Ambulatory Visit: Payer: Self-pay

## 2022-09-06 DIAGNOSIS — E538 Deficiency of other specified B group vitamins: Secondary | ICD-10-CM

## 2022-09-06 DIAGNOSIS — Z7901 Long term (current) use of anticoagulants: Secondary | ICD-10-CM | POA: Diagnosis not present

## 2022-09-06 DIAGNOSIS — Z86718 Personal history of other venous thrombosis and embolism: Secondary | ICD-10-CM | POA: Diagnosis not present

## 2022-09-06 DIAGNOSIS — E559 Vitamin D deficiency, unspecified: Secondary | ICD-10-CM | POA: Diagnosis not present

## 2022-09-06 LAB — VITAMIN B12: Vitamin B-12: 280 pg/mL (ref 180–914)

## 2022-09-11 NOTE — Progress Notes (Unsigned)
VIRTUAL VISIT via TELEPHONE NOTE [facility]   I connected with Christina Gonzales  on 09/12/22 at 11:10 AM by telephone and verified that I am speaking with the correct person using two identifiers.  Location: Patient: Home Provider: [facility]   I discussed the limitations, risks, security and privacy concerns of performing an evaluation and management service by telephone and the availability of in person appointments. I also discussed with the patient that there may be a patient responsible charge related to this service. The patient expressed understanding and agreed to proceed.  REASON FOR VISIT: Recurrent DVTs, B12 deficiency, vitamin D deficiency   CURRENT THERAPY: Eliquis, vitamin B12 2000 mcg daily, vitamin D 50,000 units weekly  INTERVAL HISTORY:  Christina Gonzales is contacted today for follow-up of her vitamin B12 deficiency.  She was last seen by Rojelio Brenner PA-C on 05/23/2022.  Since her last visit, she was hospitalized from 06/12/2022 through 06/13/2022 for community-acquired pneumonia.  At today's visit, she reports feeling well.  She has been compliant with taking vitamin B12 2000 mcg daily.  She reports 100% energy and 100% appetite.  REVIEW OF SYSTEMS:   Review of Systems  Constitutional:  Negative for chills, diaphoresis, fever, malaise/fatigue and weight loss.  Respiratory:  Negative for cough and shortness of breath.   Cardiovascular:  Negative for chest pain and palpitations.  Gastrointestinal:  Negative for abdominal pain, blood in stool, melena, nausea and vomiting.  Neurological:  Negative for dizziness and headaches.  Psychiatric/Behavioral:  The patient has insomnia.      PHYSICAL EXAM: (per limitations of virtual telephone visit)  The patient is alert and oriented x 3, exhibiting adequate mentation, good mood, and ability to speak in full sentences and execute sound judgement.  ASSESSMENT & PLAN:  1.   Vitamin B12  deficiency: - Prior labs (01/10/2022): Vitamin B12 level low at 155, MMA elevated at 525 - She was instructed to take vitamin B12 2000 mcg daily, but admits to only taking vitamin B12 1000 mcg "here and there" - Repeat labs (05/16/2022): Persistent vitamin B12 deficiency with vitamin B12 145, MMA elevated 553 - She has been taking vitamin B12 2000 mcg daily for the past 3 months.   - Most recent labs (09/06/2022): Vitamin B12 improved at 280, MMA normalized at 211 - PLAN: Continue compliance with vitamin B12 supplements 2000 mcg daily. - We will see her for labs and follow-up visit in 6 months  2.  Vitamin D deficiency: - She is currently taking vitamin D 50,000 units every week - Most recent labs (05/16/2022) show high-normal vitamin D 70.64 - PLAN: Recommend DECREASING vitamin D 50,000 units to every other week  3.  Pulmonary nodule: - Patient had a CT of the chest done on 09/04/2017 which showed 2 new nodules in the LLL largest measuring 6 mm. - Patient had a repeat CT of the chest in December 2019 which showed nodules are stable at this time. - CT of chest on 02/16/2019 showed development of a new 3 mm right upper lobe pulmonary nodule.  Follow-up with noncontrast CT in 12 months.  6 mm left apical nodule remains stable from previous scans it is characterized as benign. - Patient declined follow-up CT scan due to transportation issues and financial constraints. - CTA chest from ED visit (06/12/2022) notes stable 5 mm left apical lung nodule, benign, no follow-up imaging recommended.  No mention was made of previously noted right upper lobe pulmonary  nodule.  There is minimal tree-in-bud density at right middle lobe consistent with respiratory infection. - She denies any new cough, changes in breathing, hemoptysis, or unintentional weight loss.  4.  History of recurrent DVTs (addressed at visit on 05/23/2022) - Patient had a previous DVT in the right leg in 2011.  She was treated with 4 to 6 months of  anticoagulation with resolution of the clot. - She was then diagnosed in 2019 with a left lower leg extremity DVT on a Doppler that was done 07/31/2017 that showed extensive left lower extremity DVT extending from the left peroneal vein through the femoral popliteal system. - She denied any recent extensive travel.  She was not on any estrogen supplementation.  She denies a history of miscarriages.  She denies any recent surgeries.  She denies a family history of blood clots, heart attacks, or strokes. - It was deemed to be an unprovoked DVT. - Hypercoagulable workup NEGATIVE: Negative for factor V Leiden, PT gene mutation, beta-2 glycoprotein, and negative LA. - She has been on Eliquis since 2019 due to history of recurrent unprovoked - She remains on Eliquis to date, tolerating well without any major bleeding events.   - No current symptoms of acute DVT or PE. - Most recent labs (05/16/2022) show normal kidney function, normal CBC.  Last D-dimer on 01/10/2022 was normal. - PLAN: Continue Eliquis with periodic (annual) reassessment of ongoing risk/benefits of indefinite anticoagulation.  Patient is aware of alarm symptoms that would prompt immediate medical attention.  PLAN SUMMARY: >> Labs in 6 months = CBC/D, CMP, D-dimer, B12, MMA, vitamin D, ferritin, iron/TIBC >> OFFICE visit in 6 months (after labs)     I discussed the assessment and treatment plan with the patient. The patient was provided an opportunity to ask questions and all were answered. The patient agreed with the plan and demonstrated an understanding of the instructions.   The patient was advised to call back or seek an in-person evaluation if the symptoms worsen or if the condition fails to improve as anticipated.  I provided 8 minutes of non-face-to-face time during this encounter.  Carnella Guadalajara, PA-C 09/12/22 11:21 AM

## 2022-09-12 ENCOUNTER — Encounter: Payer: Self-pay | Admitting: Physician Assistant

## 2022-09-12 ENCOUNTER — Inpatient Hospital Stay (HOSPITAL_BASED_OUTPATIENT_CLINIC_OR_DEPARTMENT_OTHER): Payer: Medicare HMO | Admitting: Physician Assistant

## 2022-09-12 DIAGNOSIS — E559 Vitamin D deficiency, unspecified: Secondary | ICD-10-CM | POA: Diagnosis not present

## 2022-09-12 DIAGNOSIS — E538 Deficiency of other specified B group vitamins: Secondary | ICD-10-CM | POA: Diagnosis not present

## 2022-09-12 DIAGNOSIS — Z86718 Personal history of other venous thrombosis and embolism: Secondary | ICD-10-CM

## 2022-09-12 DIAGNOSIS — R911 Solitary pulmonary nodule: Secondary | ICD-10-CM

## 2022-09-12 MED ORDER — APIXABAN 5 MG PO TABS
5.0000 mg | ORAL_TABLET | Freq: Two times a day (BID) | ORAL | 11 refills | Status: DC
Start: 2022-09-12 — End: 2023-09-22

## 2022-09-12 MED ORDER — VITAMIN D (ERGOCALCIFEROL) 1.25 MG (50000 UNIT) PO CAPS
50000.0000 [IU] | ORAL_CAPSULE | ORAL | 1 refills | Status: DC
Start: 2022-09-12 — End: 2023-09-22

## 2022-09-12 MED ORDER — VITAMIN B-12 1000 MCG PO TABS
2000.0000 ug | ORAL_TABLET | Freq: Every day | ORAL | 11 refills | Status: DC
Start: 2022-09-12 — End: 2023-09-22

## 2022-09-27 ENCOUNTER — Encounter: Payer: Self-pay | Admitting: Family

## 2022-09-27 ENCOUNTER — Ambulatory Visit (INDEPENDENT_AMBULATORY_CARE_PROVIDER_SITE_OTHER): Payer: Medicare HMO | Admitting: Family

## 2022-09-27 VITALS — BP 137/56 | HR 75 | Temp 97.4°F | Ht 62.0 in | Wt 155.0 lb

## 2022-09-27 DIAGNOSIS — E119 Type 2 diabetes mellitus without complications: Secondary | ICD-10-CM

## 2022-09-27 DIAGNOSIS — E663 Overweight: Secondary | ICD-10-CM | POA: Diagnosis not present

## 2022-09-27 DIAGNOSIS — I10 Essential (primary) hypertension: Secondary | ICD-10-CM

## 2022-09-27 DIAGNOSIS — E1169 Type 2 diabetes mellitus with other specified complication: Secondary | ICD-10-CM | POA: Diagnosis not present

## 2022-09-27 DIAGNOSIS — R569 Unspecified convulsions: Secondary | ICD-10-CM | POA: Diagnosis not present

## 2022-09-27 DIAGNOSIS — E785 Hyperlipidemia, unspecified: Secondary | ICD-10-CM

## 2022-09-27 DIAGNOSIS — F320 Major depressive disorder, single episode, mild: Secondary | ICD-10-CM

## 2022-09-27 DIAGNOSIS — F411 Generalized anxiety disorder: Secondary | ICD-10-CM | POA: Diagnosis not present

## 2022-09-27 DIAGNOSIS — F5101 Primary insomnia: Secondary | ICD-10-CM

## 2022-09-27 DIAGNOSIS — Z0001 Encounter for general adult medical examination with abnormal findings: Secondary | ICD-10-CM | POA: Diagnosis not present

## 2022-09-27 LAB — BAYER DCA HB A1C WAIVED: HB A1C (BAYER DCA - WAIVED): 6.4 % — ABNORMAL HIGH (ref 4.8–5.6)

## 2022-09-27 MED ORDER — LISINOPRIL 40 MG PO TABS
40.0000 mg | ORAL_TABLET | Freq: Every day | ORAL | 1 refills | Status: DC
Start: 2022-09-27 — End: 2023-07-01

## 2022-09-27 MED ORDER — AMLODIPINE BESYLATE 5 MG PO TABS
5.0000 mg | ORAL_TABLET | Freq: Every day | ORAL | 1 refills | Status: DC
Start: 2022-09-27 — End: 2023-04-04

## 2022-09-27 MED ORDER — ATORVASTATIN CALCIUM 20 MG PO TABS
20.0000 mg | ORAL_TABLET | Freq: Every day | ORAL | 1 refills | Status: DC
Start: 2022-09-27 — End: 2023-07-07

## 2022-09-27 MED ORDER — TRAZODONE HCL 100 MG PO TABS
100.0000 mg | ORAL_TABLET | Freq: Every day | ORAL | 1 refills | Status: DC
Start: 2022-09-27 — End: 2023-08-21

## 2022-09-27 MED ORDER — RYBELSUS 3 MG PO TABS
3.0000 mg | ORAL_TABLET | Freq: Every day | ORAL | 1 refills | Status: DC
Start: 2022-09-27 — End: 2023-03-31

## 2022-09-27 NOTE — Patient Instructions (Signed)
Insomnia Insomnia is a sleep disorder that makes it difficult to fall asleep or stay asleep. Insomnia can cause fatigue, low energy, difficulty concentrating, mood swings, and poor performance at work or school. There are three different ways to classify insomnia: Difficulty falling asleep. Difficulty staying asleep. Waking up too early in the morning. Any type of insomnia can be long-term (chronic) or short-term (acute). Both are common. Short-term insomnia usually lasts for 3 months or less. Chronic insomnia occurs at least three times a week for longer than 3 months. What are the causes? Insomnia may be caused by another condition, situation, or substance, such as: Having certain mental health conditions, such as anxiety and depression. Using caffeine, alcohol, tobacco, or drugs. Having gastrointestinal conditions, such as gastroesophageal reflux disease (GERD). Having certain medical conditions. These include: Asthma. Alzheimer's disease. Stroke. Chronic pain. An overactive thyroid gland (hyperthyroidism). Other sleep disorders, such as restless legs syndrome and sleep apnea. Menopause. Sometimes, the cause of insomnia may not be known. What increases the risk? Risk factors for insomnia include: Gender. Females are affected more often than males. Age. Insomnia is more common as people get older. Stress and certain medical and mental health conditions. Lack of exercise. Having an irregular work schedule. This may include working night shifts and traveling between different time zones. What are the signs or symptoms? If you have insomnia, the main symptom is having trouble falling asleep or having trouble staying asleep. This may lead to other symptoms, such as: Feeling tired or having low energy. Feeling nervous about going to sleep. Not feeling rested in the morning. Having trouble concentrating. Feeling irritable, anxious, or depressed. How is this diagnosed? This condition  may be diagnosed based on: Your symptoms and medical history. Your health care provider may ask about: Your sleep habits. Any medical conditions you have. Your mental health. A physical exam. How is this treated? Treatment for insomnia depends on the cause. Treatment may focus on treating an underlying condition that is causing the insomnia. Treatment may also include: Medicines to help you sleep. Counseling or therapy. Lifestyle adjustments to help you sleep better. Follow these instructions at home: Eating and drinking  Limit or avoid alcohol, caffeinated beverages, and products that contain nicotine and tobacco, especially close to bedtime. These can disrupt your sleep. Do not eat a large meal or eat spicy foods right before bedtime. This can lead to digestive discomfort that can make it hard for you to sleep. Sleep habits  Keep a sleep diary to help you and your health care provider figure out what could be causing your insomnia. Write down: When you sleep. When you wake up during the night. How well you sleep and how rested you feel the next day. Any side effects of medicines you are taking. What you eat and drink. Make your bedroom a dark, comfortable place where it is easy to fall asleep. Put up shades or blackout curtains to block light from outside. Use a white noise machine to block noise. Keep the temperature cool. Limit screen use before bedtime. This includes: Not watching TV. Not using your smartphone, tablet, or computer. Stick to a routine that includes going to bed and waking up at the same times every day and night. This can help you fall asleep faster. Consider making a quiet activity, such as reading, part of your nighttime routine. Try to avoid taking naps during the day so that you sleep better at night. Get out of bed if you are still awake after   15 minutes of trying to sleep. Keep the lights down, but try reading or doing a quiet activity. When you feel  sleepy, go back to bed. General instructions Take over-the-counter and prescription medicines only as told by your health care provider. Exercise regularly as told by your health care provider. However, avoid exercising in the hours right before bedtime. Use relaxation techniques to manage stress. Ask your health care provider to suggest some techniques that may work well for you. These may include: Breathing exercises. Routines to release muscle tension. Visualizing peaceful scenes. Make sure that you drive carefully. Do not drive if you feel very sleepy. Keep all follow-up visits. This is important. Contact a health care provider if: You are tired throughout the day. You have trouble in your daily routine due to sleepiness. You continue to have sleep problems, or your sleep problems get worse. Get help right away if: You have thoughts about hurting yourself or someone else. Get help right away if you feel like you may hurt yourself or others, or have thoughts about taking your own life. Go to your nearest emergency room or: Call 911. Call the National Suicide Prevention Lifeline at 1-800-273-8255 or 988. This is open 24 hours a day. Text the Crisis Text Line at 741741. Summary Insomnia is a sleep disorder that makes it difficult to fall asleep or stay asleep. Insomnia can be long-term (chronic) or short-term (acute). Treatment for insomnia depends on the cause. Treatment may focus on treating an underlying condition that is causing the insomnia. Keep a sleep diary to help you and your health care provider figure out what could be causing your insomnia. This information is not intended to replace advice given to you by your health care provider. Make sure you discuss any questions you have with your health care provider. Document Revised: 01/01/2021 Document Reviewed: 01/01/2021 Elsevier Patient Education  2024 Elsevier Inc.  

## 2022-09-27 NOTE — Progress Notes (Signed)
Subjective:    Patient ID: Christina Gonzales, female    DOB: August 10, 1945, 77 y.o.   MRN: [id number]  Chief Complaint  Patient presents with   Medical Management of Chronic Issues   Pt presents to the office today with CPE and chronic follow up and joint pain. She is followed by Neurologists for seizures.     She takes Eliquis 5 mg for hx of DVT.   Reports her daughter was recently diagnosed with throat cancer age 55.  Hypertension This is a chronic problem. The current episode started more than 1 year ago. The problem has been resolved since onset. The problem is controlled. Associated symptoms include anxiety and malaise/fatigue. Pertinent negatives include no blurred vision, peripheral edema or shortness of breath. Risk factors for coronary artery disease include dyslipidemia and obesity. The current treatment provides moderate improvement.  Hyperlipidemia This is a chronic problem. The current episode started more than 1 year ago. The problem is controlled. Recent lipid tests were reviewed and are normal. Pertinent negatives include no shortness of breath. Current antihyperlipidemic treatment includes statins. The current treatment provides moderate improvement of lipids. Risk factors for coronary artery disease include dyslipidemia, diabetes mellitus, hypertension, a sedentary lifestyle and post-menopausal.  Diabetes She presents for her follow-up diabetic visit. She has type 2 diabetes mellitus. Hypoglycemia symptoms include nervousness/anxiousness. Pertinent negatives for diabetes include no blurred vision and no foot paresthesias. Symptoms are stable. Risk factors for coronary artery disease include dyslipidemia, diabetes mellitus, hypertension, sedentary lifestyle and post-menopausal. She is following a generally healthy diet. Her overall blood glucose range is 90-110 mg/dl.  Anxiety Presents for follow-up visit. Symptoms include excessive worry, insomnia and nervous/anxious behavior.  Patient reports no shortness of breath. Symptoms occur occasionally. The severity of symptoms is mild.    Depression        This is a chronic problem.  The current episode started more than 1 year ago.   The onset quality is sudden.   The problem occurs intermittently.  Associated symptoms include insomnia and sad.  Associated symptoms include no helplessness and no hopelessness.  Past medical history includes anxiety.   Insomnia Primary symptoms: difficulty falling asleep, malaise/fatigue.   The current episode started more than one year. The onset quality is gradual. The problem occurs intermittently. Past treatments include medication. The treatment provided moderate relief. PMH includes: depression.       Review of Systems  Constitutional:  Positive for malaise/fatigue.  Eyes:  Negative for blurred vision.  Respiratory:  Negative for shortness of breath.   Psychiatric/Behavioral:  Positive for depression. The patient is nervous/anxious and has insomnia.   All other systems reviewed and are negative.  Family History  Problem Relation Age of Onset   Cancer Mother        lung   Diabetes Brother    Social History   Socioeconomic History   Marital status: Married    Spouse name: Chrissie Noa   Number of children: 3   Years of education: 12   Highest education level: 12th grade  Occupational History   Not on file  Tobacco Use   Smoking status: Never   Smokeless tobacco: Never  Vaping Use   Vaping status: Never Used  Substance and Sexual Activity   Alcohol use: No   Drug use: No   Sexual activity: Not Currently  Other Topics Concern   Not on file  Social History Narrative   Retired, lives with husband [place]. Two daughters living, one son deceased.  Enjoys walking.    Social Determinants of Health   Financial Resource Strain: Not on file  Food Insecurity: No Food Insecurity (06/13/2022)   Hunger Vital Sign    Worried About Running Out of Food in the Last Year: Never true     Ran Out of Food in the Last Year: Never true  Transportation Needs: No Transportation Needs (06/13/2022)   PRAPARE - Administrator, Civil Service (Medical): No    Lack of Transportation (Non-Medical): No  Physical Activity: Not on file  Stress: Not on file  Social Connections: Not on file       Objective:   Physical Exam Vitals reviewed.  Constitutional:      General: She is not in acute distress.    Appearance: She is well-developed.  HENT:     Head: Normocephalic and atraumatic.     Right Ear: Tympanic membrane normal.     Left Ear: Tympanic membrane normal.  Eyes:     Pupils: Pupils are equal, round, and reactive to light.  Neck:     Thyroid: No thyromegaly.  Cardiovascular:     Rate and Rhythm: Normal rate and regular rhythm.     Heart sounds: Normal heart sounds. No murmur heard. Pulmonary:     Effort: Pulmonary effort is normal. No respiratory distress.     Breath sounds: Normal breath sounds. No wheezing.  Abdominal:     General: Bowel sounds are normal. There is no distension.     Palpations: Abdomen is soft.     Tenderness: There is no abdominal tenderness.  Musculoskeletal:        General: No tenderness. Normal range of motion.     Cervical back: Normal range of motion and neck supple.  Skin:    General: Skin is warm and dry.  Neurological:     Mental Status: She is alert and oriented to person, place, and time.     Cranial Nerves: No cranial nerve deficit.     Deep Tendon Reflexes: Reflexes are normal and symmetric.  Psychiatric:        Behavior: Behavior normal.        Thought Content: Thought content normal.        Judgment: Judgment normal.          BP (!) 137/56   Pulse 75   Temp (!) 97.4 F (36.3 C) (Temporal)   Ht 5\' 2"  (1.575 m)   Wt 155 lb (70.3 kg)   SpO2 97%   BMI 28.35 kg/m   Assessment & Plan:   Christina Gonzales comes in today with chief complaint of Medical Management of Chronic Issues   Diagnosis and  orders addressed:  1. Essential hypertension, benign - amLODipine (NORVASC) 5 MG tablet; Take 1 tablet (5 mg total) by mouth daily.  Dispense: 90 tablet; Refill: 1 - lisinopril (ZESTRIL) 40 MG tablet; Take 1 tablet (40 mg total) by mouth daily.  Dispense: 90 tablet; Refill: 1  2. Hyperlipidemia, unspecified hyperlipidemia type - atorvastatin (LIPITOR) 20 MG tablet; Take 1 tablet (20 mg total) by mouth daily.  Dispense: 90 tablet; Refill: 1  3. Type 2 diabetes mellitus with other specified complication, without long-term current use of insulin (HCC) - Semaglutide (RYBELSUS) 3 MG TABS; Take 1 tablet (3 mg total) by mouth daily.  Dispense: 90 tablet; Refill: 1 - Bayer DCA Hb A1c Waived - CMP14+EGFR - Lipid panel - TSH - traZODone (DESYREL) 100 MG tablet; Take 1 tablet (100 mg total)  by mouth at bedtime.  Dispense: 90 tablet; Refill: 1 - Microalbumin / creatinine urine ratio  4. Current mild episode of major depressive disorder without prior episode (HCC)  5. Diabetes mellitus type 2 in nonobese (HCC)  6. GAD (generalized anxiety disorder)  7. Hyperlipidemia associated with type 2 diabetes mellitus (HCC)  8. Overweight (BMI 25.0-29.9)  9. Primary insomnia  10. Seizures (HCC)   Labs pending Continue medications  Stop Ambien and start trazodone  Health Maintenance reviewed Diet and exercise encouraged  Follow up plan: 3 months    Jannifer Rodney, FNP

## 2022-09-28 LAB — CMP14+EGFR
ALT: 41 IU/L — ABNORMAL HIGH (ref 0–32)
AST: 33 IU/L (ref 0–40)
Albumin: 4.4 g/dL (ref 3.8–4.8)
Alkaline Phosphatase: 39 IU/L — ABNORMAL LOW (ref 44–121)
BUN/Creatinine Ratio: 33 — ABNORMAL HIGH (ref 12–28)
BUN: 23 mg/dL (ref 8–27)
Bilirubin Total: 0.4 mg/dL (ref 0.0–1.2)
CO2: 22 mmol/L (ref 20–29)
Calcium: 10.1 mg/dL (ref 8.7–10.3)
Chloride: 103 mmol/L (ref 96–106)
Creatinine, Ser: 0.7 mg/dL (ref 0.57–1.00)
Globulin, Total: 2.7 g/dL (ref 1.5–4.5)
Glucose: 97 mg/dL (ref 70–99)
Potassium: 4.3 mmol/L (ref 3.5–5.2)
Sodium: 139 mmol/L (ref 134–144)
Total Protein: 7.1 g/dL (ref 6.0–8.5)
eGFR: 89 mL/min/{1.73_m2} (ref >59)

## 2022-09-28 LAB — LIPID PANEL
Chol/HDL Ratio: 2.6 ratio (ref 0.0–4.4)
Cholesterol, Total: 159 mg/dL (ref 100–199)
HDL: 61 mg/dL (ref >39)
LDL Chol Calc (NIH): 78 mg/dL (ref 0–99)
Triglycerides: 113 mg/dL (ref 0–149)
VLDL Cholesterol Cal: 20 mg/dL (ref 5–40)

## 2022-09-28 LAB — MICROALBUMIN / CREATININE URINE RATIO
Creatinine, Urine: 84.1 mg/dL
Microalb/Creat Ratio: 4 mg/g{creat} (ref 0–29)
Microalbumin, Urine: 3 ug/mL

## 2022-09-28 LAB — TSH: TSH: 1.58 u[IU]/mL (ref 0.450–4.500)

## 2023-01-31 ENCOUNTER — Other Ambulatory Visit: Payer: Self-pay | Admitting: Family

## 2023-02-07 ENCOUNTER — Telehealth: Payer: Self-pay | Admitting: Family Medicine

## 2023-02-07 NOTE — Telephone Encounter (Signed)
 Copied from CRM [id number]. Topic: Clinical - Prescription Issue >> Feb 07, 2023 12:48 PM Delon T wrote: Reason for CRM: Just started new insurance and they want $300 foapixaban  (ELIQUIS ) 5 MG TABS to start out.  Patient is asking if they office has any samples.  Please call patient [phone]

## 2023-02-07 NOTE — Telephone Encounter (Signed)
 No samples available right now left detailed message with patient.

## 2023-03-10 ENCOUNTER — Inpatient Hospital Stay: Payer: Medicare Other | Attending: Hematology

## 2023-03-10 DIAGNOSIS — Z79899 Other long term (current) drug therapy: Secondary | ICD-10-CM | POA: Diagnosis not present

## 2023-03-10 DIAGNOSIS — E611 Iron deficiency: Secondary | ICD-10-CM | POA: Insufficient documentation

## 2023-03-10 DIAGNOSIS — E559 Vitamin D deficiency, unspecified: Secondary | ICD-10-CM | POA: Insufficient documentation

## 2023-03-10 DIAGNOSIS — R918 Other nonspecific abnormal finding of lung field: Secondary | ICD-10-CM | POA: Diagnosis not present

## 2023-03-10 DIAGNOSIS — Z801 Family history of malignant neoplasm of trachea, bronchus and lung: Secondary | ICD-10-CM | POA: Diagnosis not present

## 2023-03-10 DIAGNOSIS — Z7901 Long term (current) use of anticoagulants: Secondary | ICD-10-CM | POA: Insufficient documentation

## 2023-03-10 DIAGNOSIS — Z86718 Personal history of other venous thrombosis and embolism: Secondary | ICD-10-CM | POA: Insufficient documentation

## 2023-03-10 DIAGNOSIS — E538 Deficiency of other specified B group vitamins: Secondary | ICD-10-CM | POA: Insufficient documentation

## 2023-03-10 LAB — COMPREHENSIVE METABOLIC PANEL
ALT: 30 U/L (ref 0–44)
AST: 28 U/L (ref 15–41)
Albumin: 3.9 g/dL (ref 3.5–5.0)
Alkaline Phosphatase: 28 U/L — ABNORMAL LOW (ref 38–126)
Anion gap: 9 (ref 5–15)
BUN: 27 mg/dL — ABNORMAL HIGH (ref 8–23)
CO2: 23 mmol/L (ref 22–32)
Calcium: 9.7 mg/dL (ref 8.9–10.3)
Chloride: 106 mmol/L (ref 98–111)
Creatinine, Ser: 0.63 mg/dL (ref 0.44–1.00)
GFR, Estimated: >60 mL/min (ref >60)
Glucose, Bld: 83 mg/dL (ref 70–99)
Potassium: 3.7 mmol/L (ref 3.5–5.1)
Sodium: 138 mmol/L (ref 135–145)
Total Bilirubin: 0.5 mg/dL (ref 0.0–1.2)
Total Protein: 7.7 g/dL (ref 6.5–8.1)

## 2023-03-10 LAB — CBC WITH DIFFERENTIAL/PLATELET
Abs Immature Granulocytes: 0.02 10*3/uL (ref 0.00–0.07)
Basophils Absolute: 0 10*3/uL (ref 0.0–0.1)
Basophils Relative: 1 %
Eosinophils Absolute: 0.2 10*3/uL (ref 0.0–0.5)
Eosinophils Relative: 2 %
HCT: 43 % (ref 36.0–46.0)
Hemoglobin: 14.1 g/dL (ref 12.0–15.0)
Immature Granulocytes: 0 %
Lymphocytes Relative: 36 %
Lymphs Abs: 2.6 10*3/uL (ref 0.7–4.0)
MCH: 29.6 pg (ref 26.0–34.0)
MCHC: 32.8 g/dL (ref 30.0–36.0)
MCV: 90.3 fL (ref 80.0–100.0)
Monocytes Absolute: 0.5 10*3/uL (ref 0.1–1.0)
Monocytes Relative: 7 %
Neutro Abs: 4 10*3/uL (ref 1.7–7.7)
Neutrophils Relative %: 54 %
Platelets: 183 10*3/uL (ref 150–400)
RBC: 4.76 MIL/uL (ref 3.87–5.11)
RDW: 12.1 % (ref 11.5–15.5)
WBC: 7.3 10*3/uL (ref 4.0–10.5)
nRBC: 0 % (ref 0.0–0.2)

## 2023-03-10 LAB — VITAMIN D 25 HYDROXY (VIT D DEFICIENCY, FRACTURES): Vit D, 25-Hydroxy: 64.65 ng/mL (ref 30–100)

## 2023-03-10 LAB — IRON AND TIBC
Iron: 63 ug/dL (ref 28–170)
Saturation Ratios: 17 % (ref 10.4–31.8)
TIBC: 379 ug/dL (ref 250–450)
UIBC: 316 ug/dL

## 2023-03-10 LAB — D-DIMER, QUANTITATIVE: D-Dimer, Quant: <0.27 ug{FEU}/mL (ref 0.00–0.50)

## 2023-03-10 LAB — FERRITIN: Ferritin: 26 ng/mL (ref 11–307)

## 2023-03-10 LAB — VITAMIN B12: Vitamin B-12: 314 pg/mL (ref 180–914)

## 2023-03-11 ENCOUNTER — Telehealth (INDEPENDENT_AMBULATORY_CARE_PROVIDER_SITE_OTHER): Payer: Medicare Other | Admitting: Family

## 2023-03-11 ENCOUNTER — Encounter: Payer: Self-pay | Admitting: Family

## 2023-03-11 DIAGNOSIS — R300 Dysuria: Secondary | ICD-10-CM | POA: Diagnosis not present

## 2023-03-11 DIAGNOSIS — R350 Frequency of micturition: Secondary | ICD-10-CM

## 2023-03-11 LAB — MICROSCOPIC EXAMINATION
Bacteria, UA: NONE SEEN
RBC, Urine: NONE SEEN /[HPF] (ref 0–2)
Renal Epithel, UA: NONE SEEN /[HPF]
Yeast, UA: NONE SEEN

## 2023-03-11 LAB — URINALYSIS, COMPLETE
Bilirubin, UA: NEGATIVE
Ketones, UA: NEGATIVE
Nitrite, UA: NEGATIVE
Protein,UA: NEGATIVE
RBC, UA: NEGATIVE
Specific Gravity, UA: <1.005 — ABNORMAL LOW (ref 1.005–1.030)
Urobilinogen, Ur: 0.2 mg/dL (ref 0.2–1.0)
pH, UA: 5.5 (ref 5.0–7.5)

## 2023-03-11 NOTE — Progress Notes (Signed)
 Virtual Visit Consent   Christina Gonzales, you are scheduled for a virtual visit with a [facility] provider today. Just as with appointments in the office, your consent must be obtained to participate. Your consent will be active for this visit and any virtual visit you may have with one of our providers in the next 365 days. If you have a MyChart account, a copy of this consent can be sent to you electronically.  As this is a virtual visit, video technology does not allow for your provider to perform a traditional examination. This may limit your provider's ability to fully assess your condition. If your provider identifies any concerns that need to be evaluated in person or the need to arrange testing (such as labs, EKG, etc.), we will make arrangements to do so. Although advances in technology are sophisticated, we cannot ensure that it will always work on either your end or our end. If the connection with a video visit is poor, the visit may have to be switched to a telephone visit. With either a video or telephone visit, we are not always able to ensure that we have a secure connection.  By engaging in this virtual visit, you consent to the provision of healthcare and authorize for your insurance to be billed (if applicable) for the services provided during this visit. Depending on your insurance coverage, you may receive a charge related to this service.  I need to obtain your verbal consent now. Are you willing to proceed with your visit today? Christina Gonzales has provided verbal consent on 03/11/2023 for a virtual visit (video or telephone). Bari Learn, FNP  Date: 03/11/2023 10:21 AM  Virtual Visit via Video Note   I, Bari Learn, connected with  Christina Gonzales  ([id number], Jun 25, 1945) on 03/11/23 at 10:25 AM EST by a video-enabled telemedicine application and verified that I am speaking with the correct person using two identifiers.  Location: Patient: Virtual Visit  Location Patient: Home Provider: Virtual Visit Location Provider: Home Office   I discussed the limitations of evaluation and management by telemedicine and the availability of in person appointments. The patient expressed understanding and agreed to proceed.    History of Present Illness: Christina Gonzales is a 78 y.o. who identifies as a female who was assigned female at birth, and is being seen today for UTI symptoms.  HPI: Urinary Frequency  This is a new problem. The current episode started 1 to 4 weeks ago. The problem occurs intermittently. The problem has been waxing and waning. Quality: pressure. The pain is mild. There has been no fever. Associated symptoms include frequency and urgency. Pertinent negatives include no discharge, flank pain, hematuria, nausea or vomiting. She has tried increased fluids for the symptoms. The treatment provided mild relief.    Problems:  Patient Active Problem List   Diagnosis Date Noted   Diabetes mellitus type 2 in nonobese (HCC) 06/13/2022   CAP (community acquired pneumonia) 06/12/2022   Primary insomnia 05/31/2021   History of deep vein thrombosis (DVT) of lower extremity 06/11/2018   Current mild episode of major depressive disorder without prior episode (HCC) 03/03/2018   Grief 03/03/2018   Seborrheic keratosis 02/26/2017   Overweight (BMI 25.0-29.9) 05/10/2016   GAD (generalized anxiety disorder) 11/14/2014   Hyperlipidemia associated with type 2 diabetes mellitus (HCC) 09/01/2013   Essential hypertension, benign 09/01/2013   Seizures (HCC) 09/01/2013   Meningioma (HCC) 02/27/2012    Allergies:  Allergies  Allergen Reactions  Heparin Nausea And Vomiting   Warfarin Other (See Comments)    unknown   Penicillins Rash   Medications:  Current Outpatient Medications:    amLODipine  (NORVASC ) 5 MG tablet, Take 1 tablet (5 mg total) by mouth daily., Disp: 90 tablet, Rfl: 1   apixaban  (ELIQUIS ) 5 MG TABS tablet, Take 1 tablet (5 mg  total) by mouth 2 (two) times daily., Disp: 60 tablet, Rfl: 11   atorvastatin  (LIPITOR) 20 MG tablet, Take 1 tablet (20 mg total) by mouth daily., Disp: 90 tablet, Rfl: 1   blood glucose meter kit and supplies, Dispense based on patient and insurance preference. Use up to four times daily as directed. (FOR ICD-10 E10.9, E11.9)., Disp: 1 each, Rfl: 0   Blood Glucose Monitoring Suppl DEVI, 1 each by Does not apply route in the morning, at noon, and at bedtime. May substitute to any manufacturer covered by patient's insurance., Disp: 1 each, Rfl: 0   cyanocobalamin  (VITAMIN B12) 1000 MCG tablet, Take 2 tablets (2,000 mcg total) by mouth daily., Disp: 60 tablet, Rfl: 11   dapagliflozin  propanediol (FARXIGA ) 10 MG TABS tablet, Take 1 tablet (10 mg total) by mouth daily before breakfast. **NEEDS TO BE SEEN BEFORE NEXT REFILL**, Disp: 30 tablet, Rfl: 0   Lancets (ONETOUCH DELICA PLUS LANCET33G) MISC, Check twice a day and as needed (Patient taking differently: Check twice daily as needed), Disp: 100 each, Rfl: 6   levETIRAcetam  (KEPPRA ) 500 MG tablet, Take 500 mg by mouth 2 (two) times daily., Disp: , Rfl:    lisinopril  (ZESTRIL ) 40 MG tablet, Take 1 tablet (40 mg total) by mouth daily., Disp: 90 tablet, Rfl: 1   Multiple Vitamins tablet, Take 1 tablet by mouth. Reported on 05/26/2015, Disp: , Rfl:    ONETOUCH ULTRA test strip, 1 each by Other route 4 (four) times daily., Disp: 100 each, Rfl: 6   Semaglutide  (RYBELSUS ) 3 MG TABS, Take 1 tablet (3 mg total) by mouth daily., Disp: 90 tablet, Rfl: 1   traZODone  (DESYREL ) 100 MG tablet, Take 1 tablet (100 mg total) by mouth at bedtime., Disp: 90 tablet, Rfl: 1   Vitamin D , Ergocalciferol , (DRISDOL ) 1.25 MG (50000 UNIT) CAPS capsule, Take 1 capsule (50,000 Units total) by mouth every 14 (fourteen) days., Disp: 12 capsule, Rfl: 1  Observations/Objective: Patient is well-developed, well-nourished in no acute distress.  Resting comfortably  at home.  Head is  normocephalic, atraumatic.  No labored breathing.  Speech is clear and coherent with logical content.  Patient is alert and oriented at baseline.    Assessment and Plan: 1. Dysuria (Primary) - Urinalysis, Complete - Urine Culture  Urine negative for UTI, urine culture pending Force fluids  Follow up if symptoms worsen or do not improve   Follow Up Instructions: I discussed the assessment and treatment plan with the patient. The patient was provided an opportunity to ask questions and all were answered. The patient agreed with the plan and demonstrated an understanding of the instructions.  A copy of instructions were sent to the patient via MyChart unless otherwise noted below.    The patient was advised to call back or seek an in-person evaluation if the symptoms worsen or if the condition fails to improve as anticipated.    Bari Learn, FNP

## 2023-03-12 LAB — METHYLMALONIC ACID, SERUM: Methylmalonic Acid, Quantitative: 269 nmol/L (ref 0–378)

## 2023-03-13 ENCOUNTER — Other Ambulatory Visit: Payer: Self-pay | Admitting: Family

## 2023-03-13 LAB — URINE CULTURE

## 2023-03-13 MED ORDER — CEPHALEXIN 500 MG PO CAPS: 500.0000 mg | ORAL_CAPSULE | Freq: Two times a day (BID) | ORAL | 0 refills | Status: DC

## 2023-03-14 NOTE — Progress Notes (Signed)
 [facility] [address], [place] [address]   CLINIC:  Medical Oncology/Hematology  PCP:  Yevette Hem, FNP [address] [place] [address] [phone]   REASON FOR VISIT: Recurrent DVTs, B12 deficiency, vitamin D  deficiency   CURRENT THERAPY: Eliquis , vitamin B12 2000 mcg daily, vitamin D  50,000 units weekly  INTERVAL HISTORY:   [name] 78 y.o. female returns for routine follow-up of  her recurrent DVTs, B12 deficiency, and vitamin D  deficiency.  She was last evaluated via telemedicine visit by Sheril Dines PA-C on 09/12/2022.   At today's visit, she reports feeling fair.  She stays active working with [employer] and caring for her husband, but is grieving the loss of her daughter who passed away in 2023-03-16.  She has not had any episodes of DVT or PE in the interim since her last visit.  She denies any current symptoms of DVT such as unilateral leg swelling, pain, and erythema.   No current symptoms of PE such as shortness of breath, dyspnea on exertion, chest pain, hemoptysis, and palpitations. She remains on Eliquis  twice daily.  She reports easy bruising, but denies any symptoms of bleeding such as rectal bleeding or melena.  She denies any recent falls.  She is currently taking vitamin B12 2,000 mcg daily.  She is also taking vitamin D  50,000 units once weekly.   She has 100% energy and 100% appetite. She endorses that she is maintaining a stable weight.  ASSESSMENT & PLAN:  1.  History of recurrent DVTs: - Patient had a previous DVT in the right leg in 2011.  She was treated with 4 to 6 months of anticoagulation with resolution of the clot. - She was then diagnosed in 2019 with a left lower leg extremity DVT on a Doppler that was done 07/31/2017 that showed extensive left lower extremity DVT extending from the left peroneal vein through the femoral popliteal system. - She denied any recent extensive travel.   She was not on any estrogen supplementation.  She denies a history of miscarriages.  She denies any recent surgeries.  She denies a family history of blood clots, heart attacks, or strokes. - It was deemed to be an unprovoked DVT. - Hypercoagulable workup NEGATIVE: Negative for factor V Leiden, PT gene mutation, beta-2  glycoprotein, and negative LA. - She has been on Eliquis  since 2019 due to history of recurrent unprovoked - She remains on Eliquis  to date, tolerating well without any major bleeding events.  No falls within the past year. - No current symptoms of acute DVT or PE. - Most recent labs (03/10/2023) show normal kidney function, normal CBC, and undetectable D-dimer <0.27 - PLAN: Continue Eliquis  with periodic (annual) reassessment of ongoing risk/benefits of indefinite anticoagulation.  Patient is aware of alarm symptoms that would prompt immediate medical attention.   2.  Pulmonary nodule: - Patient had a CT of the chest done on 09/04/2017 which showed 2 new nodules in the LLL largest measuring 6 mm. - Patient had a repeat CT of the chest in December 2019 which showed nodules are stable at this time. - CT of chest on 02/16/2019 showed development of a new 3 mm right upper lobe pulmonary nodule.  Follow-up with noncontrast CT in 12 months.  6 mm left apical nodule remains stable from previous scans it is characterized as benign. - Patient declined follow-up CT chest for nodule - She did have CTA chest done on 06/12/2022 which  showed 5 mm left apical lung nodule stable, no imaging follow-up recommended.  Additional findings were consistent with respiratory infection. - She denies any new cough, changes in breathing, hemoptysis, or unintentional weight loss. - PLAN: No further follow-up at this time.   3.  Vitamin B12 deficiency: - Prior labs (01/10/2022): Vitamin B12 level low at 155, MMA elevated at 525 - She is taking vitamin B12 2000 mcg daily - Most recent labs (03/10/2023): Vitamin B12  improved at 314, MMA normalized - PLAN: Continue compliance with vitamin B12 supplements 2000 mcg daily.     4.  Vitamin D  deficiency: - She is currently taking vitamin D  50,000 units every week - Most recent labs (03/10/2023) show normal vitamin D  at 64.65 - PLAN: Continue vitamin D  50,000 units every week  5.  Iron deficiency - Labs from 03/10/2023 show ferritin 26, iron saturation 17%.  No anemia - Denies any rectal bleeding or melena.  No fatigue, but has some aching leg pain at night. - PLAN:  Recommend starting ferrous sulfate 325 mg every day if tolerated, otherwise every other day     PLAN SUMMARY: >> Labs in 6 months = CBC/D, ferritin, iron/TIBC, B12, MMA, vitamin D , folate >> PHONE *or* OFFICE visit in 6 months (1 week after labs)  **Last office visit 03/17/2023 (annual office visits)     REVIEW OF SYSTEMS:   Review of Systems  Constitutional:  Negative for appetite change, chills, diaphoresis, fatigue, fever and unexpected weight change.  HENT:   Negative for lump/mass and nosebleeds.   Eyes:  Negative for eye problems.  Respiratory:  Negative for cough, hemoptysis and shortness of breath.   Cardiovascular:  Negative for chest pain, leg swelling and palpitations.  Gastrointestinal:  Negative for abdominal pain, blood in stool, constipation, diarrhea, nausea and vomiting.  Genitourinary:  Positive for dysuria (current UTI, on antibiotics). Negative for hematuria.   Skin: Negative.   Neurological:  Negative for dizziness, headaches and light-headedness.  Hematological:  Bruises/bleeds easily.  Psychiatric/Behavioral:  Positive for sleep disturbance.      PHYSICAL EXAM:  ECOG PERFORMANCE STATUS: 1 - Symptomatic but completely ambulatory  Vitals:   03/17/23 1017 03/17/23 1022  BP: (!) 187/48 (!) 156/54  Pulse: 69   Resp: 18   Temp: 98.2 F (36.8 C)   SpO2: 99%    Filed Weights   03/17/23 1017  Weight: 161 lb 9.6 oz (73.3 kg)   Physical Exam Constitutional:       Appearance: Normal appearance. She is obese.  Cardiovascular:     Heart sounds: Normal heart sounds.  Pulmonary:     Breath sounds: Normal breath sounds.  Neurological:     General: No focal deficit present.     Mental Status: Mental status is at baseline.  Psychiatric:        Behavior: Behavior normal. Behavior is cooperative.     PAST MEDICAL/SURGICAL HISTORY:  Past Medical History:  Diagnosis Date   Anxiety    DVT (deep venous thrombosis) (HCC)    Hyperlipidemia    Seizures (HCC)    Past Surgical History:  Procedure Laterality Date   TUMOR EXCISION     Behind left eye    SOCIAL HISTORY:  Social History   Socioeconomic History   Marital status: Married    Spouse name: Sammie Crigler   Number of children: 3   Years of education: 12   Highest education level: 12th grade  Occupational History   Not on file  Tobacco Use  Smoking status: Never   Smokeless tobacco: Never  Vaping Use   Vaping status: Never Used  Substance and Sexual Activity   Alcohol use: No   Drug use: No   Sexual activity: Not Currently  Other Topics Concern   Not on file  Social History Narrative   Retired, lives with husband [employer]. Two daughters living, one son deceased. Enjoys walking.    Social Drivers of Corporate investment banker Strain: Not on file  Food Insecurity: No Food Insecurity (06/13/2022)   Hunger Vital Sign    Worried About Running Out of Food in the Last Year: Never true    Ran Out of Food in the Last Year: Never true  Transportation Needs: No Transportation Needs (06/13/2022)   PRAPARE - Administrator, Civil Service (Medical): No    Lack of Transportation (Non-Medical): No  Physical Activity: Not on file  Stress: Not on file  Social Connections: Not on file  Intimate Partner Violence: Not At Risk (06/13/2022)   Humiliation, Afraid, Rape, and Kick questionnaire    Fear of Current or Ex-Partner: No    Emotionally Abused: No    Physically Abused: No    Sexually  Abused: No    FAMILY HISTORY:  Family History  Problem Relation Age of Onset   Cancer Mother        lung   Diabetes Brother     CURRENT MEDICATIONS:  Outpatient Encounter Medications as of 03/17/2023  Medication Sig Note   SPIKEVAX syringe Inject 0.5 mLs into the muscle once.    amLODipine  (NORVASC ) 5 MG tablet Take 1 tablet (5 mg total) by mouth daily.    apixaban  (ELIQUIS ) 5 MG TABS tablet Take 1 tablet (5 mg total) by mouth 2 (two) times daily.    atorvastatin  (LIPITOR) 20 MG tablet Take 1 tablet (20 mg total) by mouth daily.    blood glucose meter kit and supplies Dispense based on patient and insurance preference. Use up to four times daily as directed. (FOR ICD-10 E10.9, E11.9).    Blood Glucose Monitoring Suppl DEVI 1 each by Does not apply route in the morning, at noon, and at bedtime. May substitute to any manufacturer covered by patient's insurance.    cephALEXin  (KEFLEX ) 500 MG capsule Take 1 capsule (500 mg total) by mouth 2 (two) times daily.    cyanocobalamin  (VITAMIN B12) 1000 MCG tablet Take 2 tablets (2,000 mcg total) by mouth daily.    dapagliflozin  propanediol (FARXIGA ) 10 MG TABS tablet Take 1 tablet (10 mg total) by mouth daily before breakfast. **NEEDS TO BE SEEN BEFORE NEXT REFILL**    Lancets (ONETOUCH DELICA PLUS LANCET33G) MISC Check twice a day and as needed (Patient taking differently: Check twice daily as needed)    levETIRAcetam  (KEPPRA ) 500 MG tablet Take 500 mg by mouth 2 (two) times daily.    lisinopril  (ZESTRIL ) 40 MG tablet Take 1 tablet (40 mg total) by mouth daily.    Multiple Vitamins tablet Take 1 tablet by mouth. Reported on 05/26/2015 04/15/2013: Received from: [facility]   [facility] ULTRA test strip 1 each by Other route 4 (four) times daily.    Semaglutide  (RYBELSUS ) 3 MG TABS Take 1 tablet (3 mg total) by mouth daily.    traZODone  (DESYREL ) 100 MG tablet Take 1 tablet (100 mg total) by mouth at bedtime.    Vitamin D ,  Ergocalciferol , (DRISDOL ) 1.25 MG (50000 UNIT) CAPS capsule Take 1 capsule (50,000 Units total) by  mouth every 14 (fourteen) days.    No facility-administered encounter medications on file as of 03/17/2023.    ALLERGIES:  Allergies  Allergen Reactions   Heparin Nausea And Vomiting   Warfarin Other (See Comments)    unknown   Penicillins Rash    LABORATORY DATA:  I have reviewed the labs as listed.  CBC    Component Value Date/Time   WBC 7.3 03/10/2023 1032   RBC 4.76 03/10/2023 1032   HGB 14.1 03/10/2023 1032   HGB 12.6 04/10/2022 1018   HCT 43.0 03/10/2023 1032   HCT 37.3 04/10/2022 1018   PLT 183 03/10/2023 1032   PLT 215 04/10/2022 1018   MCV 90.3 03/10/2023 1032   MCV 87 04/10/2022 1018   MCH 29.6 03/10/2023 1032   MCHC 32.8 03/10/2023 1032   RDW 12.1 03/10/2023 1032   RDW 11.8 04/10/2022 1018   LYMPHSABS 2.6 03/10/2023 1032   LYMPHSABS 2.2 04/10/2022 1018   MONOABS 0.5 03/10/2023 1032   EOSABS 0.2 03/10/2023 1032   EOSABS 0.3 04/10/2022 1018   BASOSABS 0.0 03/10/2023 1032   BASOSABS 0.0 04/10/2022 1018      Latest Ref Rng & Units 03/10/2023   10:32 AM 09/27/2022    9:33 AM 06/27/2022   10:26 AM  CMP  Glucose 70 - 99 mg/dL 83  97  960   BUN 8 - 23 mg/dL 27  23  24    Creatinine 0.44 - 1.00 mg/dL 4.54  0.98  1.19   Sodium 135 - 145 mmol/L 138  139  140   Potassium 3.5 - 5.1 mmol/L 3.7  4.3  4.6   Chloride 98 - 111 mmol/L 106  103  103   CO2 22 - 32 mmol/L 23  22  23    Calcium  8.9 - 10.3 mg/dL 9.7  14.7  82.9   Total Protein 6.5 - 8.1 g/dL 7.7  7.1  6.9   Total Bilirubin 0.0 - 1.2 mg/dL 0.5  0.4  0.4   Alkaline Phos 38 - 126 U/L 28  39  35   AST 15 - 41 U/L 28  33  52   ALT 0 - 44 U/L 30  41  67     DIAGNOSTIC IMAGING:  I have independently reviewed the relevant imaging and discussed with the patient.   WRAP UP:  All questions were answered. The patient knows to call the clinic with any problems, questions or concerns.  Medical decision making:  Moderate  Time spent on visit: I spent 20 minutes counseling the patient face to face. The total time spent in the appointment was 30 minutes and more than 50% was on counseling.  Sonnie Dusky, PA-C  03/17/23 11:11 AM

## 2023-03-17 ENCOUNTER — Inpatient Hospital Stay (HOSPITAL_BASED_OUTPATIENT_CLINIC_OR_DEPARTMENT_OTHER): Payer: Medicare Other | Admitting: Physician Assistant

## 2023-03-17 VITALS — BP 156/54 | HR 69 | Temp 98.2°F | Resp 18 | Wt 161.6 lb

## 2023-03-17 DIAGNOSIS — E559 Vitamin D deficiency, unspecified: Secondary | ICD-10-CM

## 2023-03-17 DIAGNOSIS — E538 Deficiency of other specified B group vitamins: Secondary | ICD-10-CM

## 2023-03-17 DIAGNOSIS — D508 Other iron deficiency anemias: Secondary | ICD-10-CM | POA: Diagnosis not present

## 2023-03-17 DIAGNOSIS — Z86718 Personal history of other venous thrombosis and embolism: Secondary | ICD-10-CM

## 2023-03-17 DIAGNOSIS — Z801 Family history of malignant neoplasm of trachea, bronchus and lung: Secondary | ICD-10-CM | POA: Diagnosis not present

## 2023-03-17 DIAGNOSIS — E611 Iron deficiency: Secondary | ICD-10-CM | POA: Diagnosis not present

## 2023-03-17 DIAGNOSIS — Z79899 Other long term (current) drug therapy: Secondary | ICD-10-CM | POA: Diagnosis not present

## 2023-03-17 DIAGNOSIS — R918 Other nonspecific abnormal finding of lung field: Secondary | ICD-10-CM | POA: Diagnosis not present

## 2023-03-17 DIAGNOSIS — Z7901 Long term (current) use of anticoagulants: Secondary | ICD-10-CM | POA: Diagnosis not present

## 2023-03-17 NOTE — Patient Instructions (Addendum)
 [facility] **VISIT SUMMARY & IMPORTANT INSTRUCTIONS **   You were seen today by Sheril Dines PA-C for your follow-up visit.    HISTORY OF BLOOD CLOTS Since you have had multiple episodes of unprovoked blood clots in your legs, it is important that you continue to take daily blood thinner.  VITAMIN DEFICIENCIES Your vitamin D  level is normal.  Continue to take vitamin D  50,000 units every week. Your vitamin B-12 level is improved.  Make sure that you continue to take vitamin B12 2000 mcg daily. Your iron levels are low.  Start taking iron supplement (ferrous sulfate 325 mg), which is available over-the-counter. Take this every morning with a glass of orange juice. If iron pill causes side effects of upset stomach or constipation, you can decrease dose to take this every other day instead.  FOLLOW-UP APPOINTMENT: 6 months  ** Thank you for trusting me with your healthcare!  I strive to provide all of my patients with quality care at each visit.  If you receive a survey for this visit, I would be so grateful to you for taking the time to provide feedback.  Thank you in advance!  ~ Taneah Masri                   Dr. Paulett Boros   &   Sheril Dines, PA-C   - - - - - - - - - - - - - - - - - -    Thank you for choosing [facility] at [facility] to provide your oncology and hematology care.  To afford each patient quality time with our provider, please arrive at least 15 minutes before your scheduled appointment time.   If you have a lab appointment with the Cancer Center please come in thru the Main Entrance and check in at the main information desk.  You need to re-schedule your appointment should you arrive 10 or more minutes late.  We strive to give you quality time with our providers, and arriving late affects you and other patients whose appointments are after yours.  Also, if you no show three or more times for  appointments you may be dismissed from the clinic at the providers discretion.     Again, thank you for choosing [facility].  Our hope is that these requests will decrease the amount of time that you wait before being seen by our physicians.       _____________________________________________________________  Should you have questions after your visit to [facility], please contact our office at [phone] and follow the prompts.  Our office hours are 8:00 a.m. and 4:30 p.m. Monday - Friday.  Please note that voicemails left after 4:00 p.m. may not be returned until the following business day.  We are closed weekends and major holidays.  You do have access to a nurse 24-7, just call the main number to the clinic [phone] and do not press any options, hold on the line and a nurse will answer the phone.    For prescription refill requests, have your pharmacy contact our office and allow 72 hours.

## 2023-03-31 ENCOUNTER — Other Ambulatory Visit: Payer: Self-pay | Admitting: Family

## 2023-03-31 ENCOUNTER — Encounter: Payer: Self-pay | Admitting: Family

## 2023-03-31 DIAGNOSIS — E1169 Type 2 diabetes mellitus with other specified complication: Secondary | ICD-10-CM

## 2023-03-31 NOTE — Telephone Encounter (Signed)
Lmtcb to schedule appt Letter mailed

## 2023-03-31 NOTE — Telephone Encounter (Signed)
 Last Fill: Farxiga: 01/31/23     Rybelsus: 09/27/22  Last OV: 03/11/23 Next OV: None Scheduled  Routing to provider for review/authorization.

## 2023-03-31 NOTE — Telephone Encounter (Unsigned)
 Copied from CRM [id number]. Topic: Clinical - Medication Refill >> Mar 31, 2023 12:18 PM Antony Haste wrote: Most Recent Primary Care Visit:  Provider: Jannifer Rodney A  Department: [facility]  Visit Type: MYCHART VIDEO VISIT  Date: 03/11/2023  Medication: dapagliflozin propanediol (FARXIGA) 10 MG TABS tablet  Semaglutide (RYBELSUS) 3 MG TABS  Has the patient contacted their pharmacy? No (Agent: If no, request that the patient contact the pharmacy for the refill. If patient does not wish to contact the pharmacy document the reason why and proceed with request.) (Agent: If yes, when and what did the pharmacy advise?)  Is this the correct pharmacy for this prescription? Yes If no, delete pharmacy and type the correct one.  This is the patient's preferred pharmacy:  [facility] [address], [place] - [address] [place] [address] [place] [address] Phone: [phone] Fax: [phone]   Has the prescription been filled recently? No  Is the patient out of the medication? Yes  Has the patient been seen for an appointment in the last year OR does the patient have an upcoming appointment? Yes  Can we respond through MyChart? No, callback preferred.  Agent: Please be advised that Rx refills may take up to 3 business days. We ask that you follow-up with your pharmacy.

## 2023-03-31 NOTE — Telephone Encounter (Signed)
 [place] pt NTBS 30-d given 01/31/23

## 2023-04-01 MED ORDER — DAPAGLIFLOZIN PROPANEDIOL 10 MG PO TABS: 10.0000 mg | ORAL_TABLET | Freq: Every day | ORAL | 0 refills | Status: DC

## 2023-04-01 MED ORDER — RYBELSUS 3 MG PO TABS: 3.0000 mg | ORAL_TABLET | Freq: Every day | ORAL | 1 refills | Status: DC

## 2023-04-02 ENCOUNTER — Telehealth: Payer: Self-pay | Admitting: Pharmacy Technician

## 2023-04-02 ENCOUNTER — Other Ambulatory Visit (HOSPITAL_COMMUNITY): Payer: Self-pay

## 2023-04-02 NOTE — Telephone Encounter (Signed)
 Pharmacy Patient Advocate Encounter   Received notification from CoverMyMeds that prior authorization for RYBELSUS 3MG  TABLETS is required/requested.   Insurance verification completed.   The patient is insured through [facility] .   Per test claim: PA required; PA submitted to above mentioned insurance via CoverMyMeds Key/confirmation #/EOC [id number] Status is pending

## 2023-04-03 ENCOUNTER — Other Ambulatory Visit (HOSPITAL_COMMUNITY): Payer: Self-pay

## 2023-04-03 ENCOUNTER — Ambulatory Visit: Payer: Medicare Other | Admitting: Family

## 2023-04-03 DIAGNOSIS — I10 Essential (primary) hypertension: Secondary | ICD-10-CM

## 2023-04-03 DIAGNOSIS — E785 Hyperlipidemia, unspecified: Secondary | ICD-10-CM

## 2023-04-03 DIAGNOSIS — E1169 Type 2 diabetes mellitus with other specified complication: Secondary | ICD-10-CM

## 2023-04-03 NOTE — Telephone Encounter (Signed)
 Pharmacy Patient Advocate Encounter  Received notification from [facility] that Prior Authorization for RYBELSUS 3MG  TABLETS has been APPROVED from 04/02/2023 to 02/04/2024. Ran test claim, Copay is $47.00. This test claim was processed through [facility]- copay amounts may vary at other pharmacies due to pharmacy/plan contracts, or as the patient moves through the different stages of their insurance plan.   PA #/Case ID/Reference #: [id number]

## 2023-04-04 ENCOUNTER — Telehealth: Payer: Self-pay | Admitting: Family

## 2023-04-04 ENCOUNTER — Ambulatory Visit: Payer: Self-pay | Admitting: Family

## 2023-04-04 DIAGNOSIS — I10 Essential (primary) hypertension: Secondary | ICD-10-CM

## 2023-04-04 MED ORDER — AMLODIPINE BESYLATE 5 MG PO TABS
5.0000 mg | ORAL_TABLET | Freq: Every day | ORAL | 0 refills | Status: DC
Start: 2023-04-04 — End: 2023-04-29

## 2023-04-04 NOTE — Telephone Encounter (Signed)
  Prescription Request  04/04/2023  Is this a "Controlled Substance" medicine? no  Have you seen your PCP in the last 2 weeks? Has appt 03/25  If YES, route message to pool  -  If NO, patient needs to be scheduled for appointment.  What is the name of the medication or equipment? amLODipine  Have you contacted your pharmacy to request a refill? yes   Which pharmacy would you like this sent to? [facility]   Patient notified that their request is being sent to the clinical staff for review and that they should receive a response within 2 business days.

## 2023-04-04 NOTE — Telephone Encounter (Signed)
 This already ben sent to the pharmacy on 02/25

## 2023-04-04 NOTE — Telephone Encounter (Signed)
 Copied from CRM [id number]. Topic: Clinical - Medication Question >> Apr 04, 2023 11:43 AM Maree Krabbe H wrote: Reason for CRM: Patient is requesting a call back she has some questions about her medicine, she can't think of the name of it right now.

## 2023-04-04 NOTE — Telephone Encounter (Signed)
 Copied from CRM [id number]. Topic: Clinical - Medication Question >> Apr 04, 2023 11:43 AM Maree Krabbe H wrote: Reason for CRM: Patient is requesting a call back she has some questions about her medicine, she can't think of the name of it right now.  Chief Complaint: Medication refill Symptoms: N/A Frequency: N/A Pertinent Negatives: Patient denies N/A Disposition: [] ED /[] Urgent Care (no appt availability in office) / [] Appointment(In office/virtual)/ []  [facility] [facility]/ [] Home Care/ [] Refused Recommended Disposition /[] [place] [facility]/ []  Follow-up with PCP Additional Notes: Patient called in because she needs one of her medications to be refilled. Patient could not recall the name of the medication, but stated it was for her diabetes. After asking additional questions, this RN concluded that she needed her Rybelsus refilled. Patient stated that she is completely out of the medication and was told by the pharmacy that she need to schedule an OV, in order to get the medication refilled. Patient was originally supposed to have an OV on 04/03/23, but it got moved to 04/29/23. Patient is requesting a prescription to get her until that appointment. This RN advised the CAL. CAL advised this RN that they would notify her provider. This RN notified patient.   Reason for Disposition  [1] Prescription refill request for ESSENTIAL medicine (i.e., likelihood of harm to patient if not taken) AND [2] triager unable to refill per department policy  Protocols used: Medication Refill and Renewal Call-A-AH

## 2023-04-04 NOTE — Telephone Encounter (Signed)
 Pt aware refill sent to pharmacy

## 2023-04-07 ENCOUNTER — Ambulatory Visit: Payer: Medicare HMO | Admitting: Family

## 2023-04-08 ENCOUNTER — Ambulatory Visit: Payer: Medicare HMO | Admitting: Family

## 2023-04-09 ENCOUNTER — Encounter: Payer: Self-pay | Admitting: Family

## 2023-04-29 ENCOUNTER — Encounter: Payer: Self-pay | Admitting: Family

## 2023-04-29 ENCOUNTER — Ambulatory Visit (INDEPENDENT_AMBULATORY_CARE_PROVIDER_SITE_OTHER): Payer: Medicare HMO | Admitting: Family

## 2023-04-29 VITALS — BP 158/73 | HR 64 | Temp 98.0°F | Ht 62.0 in | Wt 164.8 lb

## 2023-04-29 DIAGNOSIS — I10 Essential (primary) hypertension: Secondary | ICD-10-CM

## 2023-04-29 DIAGNOSIS — E1169 Type 2 diabetes mellitus with other specified complication: Secondary | ICD-10-CM | POA: Diagnosis not present

## 2023-04-29 DIAGNOSIS — F5101 Primary insomnia: Secondary | ICD-10-CM | POA: Diagnosis not present

## 2023-04-29 DIAGNOSIS — R569 Unspecified convulsions: Secondary | ICD-10-CM

## 2023-04-29 DIAGNOSIS — F411 Generalized anxiety disorder: Secondary | ICD-10-CM

## 2023-04-29 DIAGNOSIS — E785 Hyperlipidemia, unspecified: Secondary | ICD-10-CM | POA: Diagnosis not present

## 2023-04-29 DIAGNOSIS — F320 Major depressive disorder, single episode, mild: Secondary | ICD-10-CM | POA: Diagnosis not present

## 2023-04-29 DIAGNOSIS — E669 Obesity, unspecified: Secondary | ICD-10-CM

## 2023-04-29 DIAGNOSIS — F4321 Adjustment disorder with depressed mood: Secondary | ICD-10-CM

## 2023-04-29 DIAGNOSIS — E119 Type 2 diabetes mellitus without complications: Secondary | ICD-10-CM

## 2023-04-29 DIAGNOSIS — Z6830 Body mass index (BMI) 30.0-30.9, adult: Secondary | ICD-10-CM

## 2023-04-29 DIAGNOSIS — R300 Dysuria: Secondary | ICD-10-CM | POA: Diagnosis not present

## 2023-04-29 DIAGNOSIS — Z86718 Personal history of other venous thrombosis and embolism: Secondary | ICD-10-CM

## 2023-04-29 LAB — URINALYSIS, COMPLETE
Bilirubin, UA: NEGATIVE
Ketones, UA: NEGATIVE
Nitrite, UA: NEGATIVE
Protein,UA: NEGATIVE
RBC, UA: NEGATIVE
Specific Gravity, UA: 1.01 (ref 1.005–1.030)
Urobilinogen, Ur: 0.2 mg/dL (ref 0.2–1.0)
pH, UA: 5.5 (ref 5.0–7.5)

## 2023-04-29 LAB — MICROSCOPIC EXAMINATION
RBC, Urine: NONE SEEN /HPF (ref 0–2)
Renal Epithel, UA: NONE SEEN /HPF
Yeast, UA: NONE SEEN

## 2023-04-29 LAB — LIPID PANEL

## 2023-04-29 LAB — BAYER DCA HB A1C WAIVED: HB A1C (BAYER DCA - WAIVED): 6.3 % — ABNORMAL HIGH (ref 4.8–5.6)

## 2023-04-29 MED ORDER — AMLODIPINE BESYLATE 10 MG PO TABS: 10.0000 mg | ORAL_TABLET | Freq: Every day | ORAL | 2 refills | Status: DC

## 2023-04-29 NOTE — Patient Instructions (Signed)

## 2023-04-29 NOTE — Progress Notes (Signed)
 Subjective:    Patient ID: Christina Gonzales, female    DOB: 1945-08-15, 78 y.o.   MRN: [id number]  Chief Complaint  Patient presents with   Urinary Tract Infection   Joint Pain   Pt presents to the office today with vaginal itching and  chronic follow up.  She is followed by Neurologists for seizures.     She takes Eliquis 5 mg for hx of DVT.   Reports her daughter died throat cancer age 16 in 17-Mar-2023.   Vaginal Itching The patient's primary symptoms include genital itching. This is a new problem. The current episode started 1 to 4 weeks ago. The problem occurs intermittently.  Hypertension This is a chronic problem. The current episode started more than 1 year ago. The problem has been rapidly improving since onset. The problem is uncontrolled. Associated symptoms include anxiety. Pertinent negatives include no blurred vision, malaise/fatigue, peripheral edema or shortness of breath. Risk factors for coronary artery disease include dyslipidemia, diabetes mellitus and sedentary lifestyle. The current treatment provides moderate improvement.  Hyperlipidemia This is a chronic problem. The current episode started more than 1 year ago. The problem is controlled. Recent lipid tests were reviewed and are normal. Pertinent negatives include no shortness of breath. Current antihyperlipidemic treatment includes statins. The current treatment provides moderate improvement of lipids. Risk factors for coronary artery disease include dyslipidemia, diabetes mellitus, hypertension, a sedentary lifestyle and post-menopausal.  Diabetes She presents for her follow-up diabetic visit. She has type 2 diabetes mellitus. Hypoglycemia symptoms include nervousness/anxiousness. Pertinent negatives for diabetes include no blurred vision and no foot paresthesias. Symptoms are stable. Risk factors for coronary artery disease include diabetes mellitus, dyslipidemia, hypertension, sedentary lifestyle and  post-menopausal. She is following a generally unhealthy diet. Her overall blood glucose range is 140-180 mg/dl.  Insomnia Primary symptoms: sleep disturbance, difficulty falling asleep, frequent awakening, no malaise/fatigue.   The current episode started more than one year. The onset quality is gradual. PMH includes: depression.   Anxiety Presents for follow-up visit. Symptoms include excessive worry, insomnia, nervous/anxious behavior and restlessness. Patient reports no shortness of breath. Symptoms occur occasionally. The severity of symptoms is mild.    Depression        This is a chronic problem.  The current episode started more than 1 year ago.   The problem occurs intermittently.  Associated symptoms include insomnia, restlessness and sad.  Associated symptoms include no helplessness and no hopelessness.  Past medical history includes anxiety.       Review of Systems  Constitutional:  Negative for malaise/fatigue.  Eyes:  Negative for blurred vision.  Respiratory:  Negative for shortness of breath.   Psychiatric/Behavioral:  Positive for depression and sleep disturbance. The patient is nervous/anxious and has insomnia.   All other systems reviewed and are negative.   Social History   Socioeconomic History   Marital status: Married    Spouse name: Christina Gonzales   Number of children: 3   Years of education: 12   Highest education level: 12th grade  Occupational History   Not on file  Tobacco Use   Smoking status: Never   Smokeless tobacco: Never  Vaping Use   Vaping status: Never Used  Substance and Sexual Activity   Alcohol use: No   Drug use: No   Sexual activity: Not Currently  Other Topics Concern   Not on file  Social History Narrative   Retired, lives with husband Christina Gonzales. Two daughters living, one son deceased. Enjoys walking.  Social Drivers of Corporate investment banker Strain: Not on file  Food Insecurity: No Food Insecurity (06/13/2022)   Hunger Vital  Sign    Worried About Running Out of Food in the Last Year: Never true    Ran Out of Food in the Last Year: Never true  Transportation Needs: No Transportation Needs (06/13/2022)   PRAPARE - Administrator, Civil Service (Medical): No    Lack of Transportation (Non-Medical): No  Physical Activity: Not on file  Stress: Not on file  Social Connections: Not on file   Family History  Problem Relation Age of Onset   Cancer Mother        lung   Diabetes Brother         Objective:   Physical Exam Vitals reviewed.  Constitutional:      General: She is not in acute distress.    Appearance: She is well-developed.  HENT:     Head: Normocephalic and atraumatic.     Right Ear: Tympanic membrane normal.     Left Ear: Tympanic membrane normal.  Eyes:     Pupils: Pupils are equal, round, and reactive to light.  Neck:     Thyroid: No thyromegaly.  Cardiovascular:     Rate and Rhythm: Normal rate and regular rhythm.     Heart sounds: Normal heart sounds. No murmur heard. Pulmonary:     Effort: Pulmonary effort is normal. No respiratory distress.     Breath sounds: Normal breath sounds. No wheezing.  Abdominal:     General: Bowel sounds are normal. There is no distension.     Palpations: Abdomen is soft.     Tenderness: There is no abdominal tenderness.  Musculoskeletal:        General: No tenderness. Normal range of motion.     Cervical back: Normal range of motion and neck supple.  Skin:    General: Skin is warm and dry.  Neurological:     Mental Status: She is alert and oriented to person, place, and time.     Cranial Nerves: No cranial nerve deficit.     Deep Tendon Reflexes: Reflexes are normal and symmetric.  Psychiatric:        Behavior: Behavior normal.        Thought Content: Thought content normal.        Judgment: Judgment normal.       BP (!) 158/73   Pulse 64   Temp 98 F (36.7 C) (Temporal)   Ht 5\' 2"  (1.575 m)   Wt 164 lb 12.8 oz (74.8 kg)    SpO2 98%   BMI 30.14 kg/m      Assessment & Plan:  Christina ZAVALETA comes in today with chief complaint of Urinary Tract Infection and Joint Pain   Diagnosis and orders addressed:  1. Dysuria (Primary) - Urinalysis, Complete - Urine Culture - CMP14+EGFR - CBC with Differential/Platelet  2. Current mild episode of major depressive disorder without prior episode (HCC) - CMP14+EGFR - CBC with Differential/Platelet  3. Diabetes mellitus type 2 in nonobese (HCC) - Bayer DCA Hb A1c Waived - CMP14+EGFR - CBC with Differential/Platelet - AMB Referral VBCI Care Management  4. Essential hypertension, benign Will increase Norvasc to 10 mg from 5 mg  -Daily blood pressure log given with instructions on how to fill out and told to bring to next visit -Dash diet information given -Exercise encouraged - Stress Management  -Continue current meds -RTO in 2 weeks  -  CMP14+EGFR - CBC with Differential/Platelet - amLODipine (NORVASC) 10 MG tablet; Take 1 tablet (10 mg total) by mouth daily.  Dispense: 90 tablet; Refill: 2  5. GAD (generalized anxiety disorder) - CMP14+EGFR - CBC with Differential/Platelet  6. Grief - CMP14+EGFR - CBC with Differential/Platelet  7. Hyperlipidemia associated with type 2 diabetes mellitus (HCC) - CMP14+EGFR - CBC with Differential/Platelet - Lipid panel  8. Seizures (HCC) - CMP14+EGFR - CBC with Differential/Platelet  9. Primary insomnia - CMP14+EGFR - CBC with Differential/Platelet  10. Obesity (BMI 30-39.9) - CMP14+EGFR - CBC with Differential/Platelet  11. History of deep vein thrombosis (DVT) of lower extremity - CMP14+EGFR - CBC with Differential/Platelet   Labs pending Will increase Norvasc 10 mg from 5 mg  -Daily blood pressure log given with instructions on how to fill out and told to bring to next visit -Dash diet information given -Exercise encouraged - Stress Management  -Continue current meds -RTO in 2 weeks   Continue current medications  Keep follow up with specialists  Health Maintenance reviewed Diet and exercise encouraged  Return in about 2 weeks (around 05/13/2023), or if symptoms worsen or fail to improve, for HTN follow up.    Jannifer Rodney, FNP

## 2023-04-30 LAB — CMP14+EGFR
ALT: 28 IU/L (ref 0–32)
AST: 24 IU/L (ref 0–40)
Albumin: 4.3 g/dL (ref 3.8–4.8)
Alkaline Phosphatase: 37 IU/L — ABNORMAL LOW (ref 44–121)
BUN/Creatinine Ratio: 28 (ref 12–28)
BUN: 19 mg/dL (ref 8–27)
Bilirubin Total: 0.5 mg/dL (ref 0.0–1.2)
CO2: 23 mmol/L (ref 20–29)
Calcium: 9.6 mg/dL (ref 8.7–10.3)
Chloride: 102 mmol/L (ref 96–106)
Creatinine, Ser: 0.67 mg/dL (ref 0.57–1.00)
Globulin, Total: 2.7 g/dL (ref 1.5–4.5)
Glucose: 114 mg/dL — ABNORMAL HIGH (ref 70–99)
Potassium: 4.4 mmol/L (ref 3.5–5.2)
Sodium: 140 mmol/L (ref 134–144)
Total Protein: 7 g/dL (ref 6.0–8.5)
eGFR: 89 mL/min/{1.73_m2} (ref >59)

## 2023-04-30 LAB — CBC WITH DIFFERENTIAL/PLATELET
Basophils Absolute: 0.1 10*3/uL (ref 0.0–0.2)
Basos: 1 %
EOS (ABSOLUTE): 0.2 10*3/uL (ref 0.0–0.4)
Eos: 3 %
Hematocrit: 44 % (ref 34.0–46.6)
Hemoglobin: 14.8 g/dL (ref 11.1–15.9)
Immature Grans (Abs): 0 10*3/uL (ref 0.0–0.1)
Immature Granulocytes: 0 %
Lymphocytes Absolute: 2.5 10*3/uL (ref 0.7–3.1)
Lymphs: 37 %
MCH: 30.3 pg (ref 26.6–33.0)
MCHC: 33.6 g/dL (ref 31.5–35.7)
MCV: 90 fL (ref 79–97)
Monocytes Absolute: 0.4 10*3/uL (ref 0.1–0.9)
Monocytes: 6 %
Neutrophils Absolute: 3.6 10*3/uL (ref 1.4–7.0)
Neutrophils: 53 %
Platelets: 175 10*3/uL (ref 150–450)
RBC: 4.89 x10E6/uL (ref 3.77–5.28)
RDW: 12.5 % (ref 11.7–15.4)
WBC: 6.8 10*3/uL (ref 3.4–10.8)

## 2023-04-30 LAB — URINE CULTURE

## 2023-04-30 LAB — LIPID PANEL
Cholesterol, Total: 162 mg/dL (ref 100–199)
HDL: 63 mg/dL (ref >39)
LDL CALC COMMENT:: 2.6 ratio (ref 0.0–4.4)
LDL Chol Calc (NIH): 78 mg/dL (ref 0–99)
Triglycerides: 116 mg/dL (ref 0–149)
VLDL Cholesterol Cal: 21 mg/dL (ref 5–40)

## 2023-05-01 ENCOUNTER — Other Ambulatory Visit: Payer: Self-pay | Admitting: Family

## 2023-05-02 ENCOUNTER — Telehealth: Payer: Self-pay

## 2023-05-02 NOTE — Progress Notes (Signed)
 Care Guide Pharmacy Note  05/02/2023 Name: Christina Gonzales MRN: [id number] DOB: 1945-10-28  Referred By: Junie Spencer, FNP Reason for referral: Care Coordination (Outreach to schedule with Pharm d )   Christina Gonzales is a 78 y.o. year old female who is a primary care patient of Junie Spencer, FNP.  Christina Gonzales was referred to the pharmacist for assistance related to: DMII  An unsuccessful telephone outreach was attempted today to contact the patient who was referred to the pharmacy team for assistance with medication management. Additional attempts will be made to contact the patient.  Penne Lash , RMA     [facility]  [facility], [facility] Guide  Direct Dial: [phone]  Website: [email or web address]

## 2023-05-05 ENCOUNTER — Telehealth: Payer: Self-pay

## 2023-05-05 NOTE — Telephone Encounter (Signed)
 Copied from CRM [id number]. Topic: Clinical - Lab/Test Results >> May 05, 2023 11:14 AM Alessandra Bevels wrote: Reason for CRM: Patient called to receive lab results. Patient expressed understanding. No questions at this time.

## 2023-05-08 NOTE — Progress Notes (Signed)
 Care Guide Pharmacy Note  05/08/2023 Name: [name] MRN: [id number] DOB: September 15, 1945  Referred By: Junie Spencer, FNP Reason for referral: Care Coordination (Outreach to schedule with Pharm d )   Christina Gonzales is a 78 y.o. year old female who is a primary care patient of Junie Spencer, FNP.  Forest Becker was referred to the pharmacist for assistance related to: DMII  Successful contact was made with the patient to discuss pharmacy services including being ready for the pharmacist to call at least 5 minutes before the scheduled appointment time and to have medication bottles and any blood pressure readings ready for review. The patient agreed to meet with the pharmacist via telephone visit on (date/time).06/10/2023  Penne Lash , RMA     [place]  [facility], [facility] Guide  Direct Dial: [phone]  Website: [email or web address]

## 2023-05-11 ENCOUNTER — Other Ambulatory Visit: Payer: Self-pay | Admitting: Family

## 2023-05-11 DIAGNOSIS — E1169 Type 2 diabetes mellitus with other specified complication: Secondary | ICD-10-CM

## 2023-05-15 ENCOUNTER — Ambulatory Visit: Admitting: Family

## 2023-05-15 ENCOUNTER — Encounter: Payer: Self-pay | Admitting: Family

## 2023-05-15 VITALS — BP 147/72 | HR 88 | Temp 98.6°F | Ht 62.0 in | Wt 166.2 lb

## 2023-05-15 DIAGNOSIS — Z7984 Long term (current) use of oral hypoglycemic drugs: Secondary | ICD-10-CM

## 2023-05-15 DIAGNOSIS — I10 Essential (primary) hypertension: Secondary | ICD-10-CM | POA: Diagnosis not present

## 2023-05-15 DIAGNOSIS — E1169 Type 2 diabetes mellitus with other specified complication: Secondary | ICD-10-CM | POA: Diagnosis not present

## 2023-05-15 MED ORDER — DAPAGLIFLOZIN PROPANEDIOL 10 MG PO TABS
10.0000 mg | ORAL_TABLET | Freq: Every day | ORAL | 1 refills | Status: AC
Start: 2023-05-15 — End: ?

## 2023-05-15 MED ORDER — RYBELSUS 3 MG PO TABS: 1.0000 | ORAL_TABLET | Freq: Every day | ORAL | 1 refills | Status: DC

## 2023-05-15 NOTE — Patient Instructions (Signed)
 Hypertension, Adult High blood pressure (hypertension) is when the force of blood pumping through the arteries is too strong. The arteries are the blood vessels that carry blood from the heart throughout the body. Hypertension forces the heart to work harder to pump blood and may cause arteries to become narrow or stiff. Untreated or uncontrolled hypertension can lead to a heart attack, heart failure, a stroke, kidney disease, and other problems. A blood pressure reading consists of a higher number over a lower number. Ideally, your blood pressure should be below 120/80. The first ("top") number is called the systolic pressure. It is a measure of the pressure in your arteries as your heart beats. The second ("bottom") number is called the diastolic pressure. It is a measure of the pressure in your arteries as the heart relaxes. What are the causes? The exact cause of this condition is not known. There are some conditions that result in high blood pressure. What increases the risk? Certain factors may make you more likely to develop high blood pressure. Some of these risk factors are under your control, including: Smoking. Not getting enough exercise or physical activity. Being overweight. Having too much fat, sugar, calories, or salt (sodium) in your diet. Drinking too much alcohol. Other risk factors include: Having a personal history of heart disease, diabetes, high cholesterol, or kidney disease. Stress. Having a family history of high blood pressure and high cholesterol. Having obstructive sleep apnea. Age. The risk increases with age. What are the signs or symptoms? High blood pressure may not cause symptoms. Very high blood pressure (hypertensive crisis) may cause: Headache. Fast or irregular heartbeats (palpitations). Shortness of breath. Nosebleed. Nausea and vomiting. Vision changes. Severe chest pain, dizziness, and seizures. How is this diagnosed? This condition is diagnosed by  measuring your blood pressure while you are seated, with your arm resting on a flat surface, your legs uncrossed, and your feet flat on the floor. The cuff of the blood pressure monitor will be placed directly against the skin of your upper arm at the level of your heart. Blood pressure should be measured at least twice using the same arm. Certain conditions can cause a difference in blood pressure between your right and left arms. If you have a high blood pressure reading during one visit or you have normal blood pressure with other risk factors, you may be asked to: Return on a different day to have your blood pressure checked again. Monitor your blood pressure at home for 1 week or longer. If you are diagnosed with hypertension, you may have other blood or imaging tests to help your health care provider understand your overall risk for other conditions. How is this treated? This condition is treated by making healthy lifestyle changes, such as eating healthy foods, exercising more, and reducing your alcohol intake. You may be referred for counseling on a healthy diet and physical activity. Your health care provider may prescribe medicine if lifestyle changes are not enough to get your blood pressure under control and if: Your systolic blood pressure is above 130. Your diastolic blood pressure is above 80. Your personal target blood pressure may vary depending on your medical conditions, your age, and other factors. Follow these instructions at home: Eating and drinking  Eat a diet that is high in fiber and potassium, and low in sodium, added sugar, and fat. An example of this eating plan is called the DASH diet. DASH stands for Dietary Approaches to Stop Hypertension. To eat this way: Eat  plenty of fresh fruits and vegetables. Try to fill one half of your plate at each meal with fruits and vegetables. Eat whole grains, such as whole-wheat pasta, brown rice, or whole-grain bread. Fill about one  fourth of your plate with whole grains. Eat or drink low-fat dairy products, such as skim milk or low-fat yogurt. Avoid fatty cuts of meat, processed or cured meats, and poultry with skin. Fill about one fourth of your plate with lean proteins, such as fish, chicken without skin, beans, eggs, or tofu. Avoid pre-made and processed foods. These tend to be higher in sodium, added sugar, and fat. Reduce your daily sodium intake. Many people with hypertension should eat less than 1,500 mg of sodium a day. Do not drink alcohol if: Your health care provider tells you not to drink. You are pregnant, may be pregnant, or are planning to become pregnant. If you drink alcohol: Limit how much you have to: 0-1 drink a day for women. 0-2 drinks a day for men. Know how much alcohol is in your drink. In the [place] one drink equals one 12 oz bottle of beer (355 mL), one 5 oz glass of wine (148 mL), or one 1 oz glass of hard liquor (44 mL). Lifestyle  Work with your health care provider to maintain a healthy body weight or to lose weight. Ask what an ideal weight is for you. Get at least 30 minutes of exercise that causes your heart to beat faster (aerobic exercise) most days of the week. Activities may include walking, swimming, or biking. Include exercise to strengthen your muscles (resistance exercise), such as Pilates or lifting weights, as part of your weekly exercise routine. Try to do these types of exercises for 30 minutes at least 3 days a week. Do not use any products that contain nicotine or tobacco. These products include cigarettes, chewing tobacco, and vaping devices, such as e-cigarettes. If you need help quitting, ask your health care provider. Monitor your blood pressure at home as told by your health care provider. Keep all follow-up visits. This is important. Medicines Take over-the-counter and prescription medicines only as told by your health care provider. Follow directions carefully. Blood  pressure medicines must be taken as prescribed. Do not skip doses of blood pressure medicine. Doing this puts you at risk for problems and can make the medicine less effective. Ask your health care provider about side effects or reactions to medicines that you should watch for. Contact a health care provider if you: Think you are having a reaction to a medicine you are taking. Have headaches that keep coming back (recurring). Feel dizzy. Have swelling in your ankles. Have trouble with your vision. Get help right away if you: Develop a severe headache or confusion. Have unusual weakness or numbness. Feel faint. Have severe pain in your chest or abdomen. Vomit repeatedly. Have trouble breathing. These symptoms may be an emergency. Get help right away. Call 911. Do not wait to see if the symptoms will go away. Do not drive yourself to the hospital. Summary Hypertension is when the force of blood pumping through your arteries is too strong. If this condition is not controlled, it may put you at risk for serious complications. Your personal target blood pressure may vary depending on your medical conditions, your age, and other factors. For most people, a normal blood pressure is less than 120/80. Hypertension is treated with lifestyle changes, medicines, or a combination of both. Lifestyle changes include losing weight, eating a healthy,  low-sodium diet, exercising more, and limiting alcohol. This information is not intended to replace advice given to you by your health care provider. Make sure you discuss any questions you have with your health care provider. Document Revised: 11/28/2020 Document Reviewed: 11/28/2020 Elsevier Patient Education  2024 [employer].

## 2023-05-15 NOTE — Progress Notes (Signed)
 Subjective:    Patient ID: Christina Gonzales, female    DOB: [birth year], 78 y.o.   MRN: [id number]  Chief Complaint  Patient presents with   Hypertension   PT presents to the office today to recheck HTN. She was seen on 04/29/23 and we increased her Norvasc to 10 mg from 5 mg. She was confused and stopped the lisinopril 40  mg. Her BP is not at goal today.  Hypertension This is a chronic problem. The current episode started more than 1 year ago. The problem has been waxing and waning since onset. The problem is uncontrolled. Pertinent negatives include no blurred vision, malaise/fatigue, peripheral edema or shortness of breath. Risk factors for coronary artery disease include dyslipidemia and sedentary lifestyle. The current treatment provides moderate improvement.  Diabetes She presents for her follow-up diabetic visit. She has type 2 diabetes mellitus. Pertinent negatives for diabetes include no blurred vision and no foot paresthesias. Risk factors for coronary artery disease include diabetes mellitus, dyslipidemia, hypertension, sedentary lifestyle and post-menopausal. She is following a generally healthy diet. Her overall blood glucose range is 110-130 mg/dl.      Review of Systems  Constitutional:  Negative for malaise/fatigue.  Eyes:  Negative for blurred vision.  Respiratory:  Negative for shortness of breath.   All other systems reviewed and are negative.   Social History   Socioeconomic History   Marital status: Married    Spouse name: Christina Gonzales   Number of children: 3   Years of education: 12   Highest education level: 12th grade  Occupational History   Not on file  Tobacco Use   Smoking status: Never   Smokeless tobacco: Never  Vaping Use   Vaping status: Never Used  Substance and Sexual Activity   Alcohol use: No   Drug use: No   Sexual activity: Not Currently  Other Topics Concern   Not on file  Social History Narrative   Retired, lives with husband  [place]. Two daughters living, one son deceased. Enjoys walking.    Social Drivers of Corporate investment banker Strain: Not on file  Food Insecurity: No Food Insecurity (06/13/2022)   Hunger Vital Sign    Worried About Running Out of Food in the Last Year: Never true    Ran Out of Food in the Last Year: Never true  Transportation Needs: No Transportation Needs (06/13/2022)   PRAPARE - Administrator, Civil Service (Medical): No    Lack of Transportation (Non-Medical): No  Physical Activity: Not on file  Stress: Not on file  Social Connections: Not on file   Family History  Problem Relation Age of Onset   Cancer Mother        lung   Diabetes Brother         Objective:   Physical Exam Vitals reviewed.  Constitutional:      General: She is not in acute distress.    Appearance: She is well-developed.  HENT:     Head: Normocephalic and atraumatic.     Right Ear: Tympanic membrane normal.     Left Ear: Tympanic membrane normal.  Eyes:     Pupils: Pupils are equal, round, and reactive to light.  Neck:     Thyroid: No thyromegaly.  Cardiovascular:     Rate and Rhythm: Normal rate and regular rhythm.     Heart sounds: Normal heart sounds. No murmur heard. Pulmonary:     Effort: Pulmonary effort is normal. No respiratory  distress.     Breath sounds: Normal breath sounds. No wheezing.  Abdominal:     General: Bowel sounds are normal. There is no distension.     Palpations: Abdomen is soft.     Tenderness: There is no abdominal tenderness.  Musculoskeletal:        General: No tenderness. Normal range of motion.     Cervical back: Normal range of motion and neck supple.  Skin:    General: Skin is warm and dry.  Neurological:     Mental Status: She is alert and oriented to person, place, and time.     Cranial Nerves: No cranial nerve deficit.     Deep Tendon Reflexes: Reflexes are normal and symmetric.  Psychiatric:        Behavior: Behavior normal.         Thought Content: Thought content normal.        Judgment: Judgment normal.       BP (!) 152/73   Pulse 88   Temp 98.6 F (37 C) (Temporal)   Ht 5\' 2"  (1.575 m)   Wt 166 lb 3.2 oz (75.4 kg)   BMI 30.40 kg/m      Assessment & Plan:  Christina Gonzales comes in today with chief complaint of Hypertension   Diagnosis and orders addressed:  1. Essential hypertension, benign (Primary) Pt will make sure she is taking her Lisnopril 40 mg with the Norvasc 10 mg -Daily blood pressure log given with instructions on how to fill out and told to bring to next visit -Dash diet information given -Exercise encouraged - Stress Management  -Continue current meds  2. Type 2 diabetes mellitus with other specified complication, without long-term current use of insulin (HCC) Low carb diet  - dapagliflozin propanediol (FARXIGA) 10 MG TABS tablet; Take 1 tablet (10 mg total) by mouth daily before breakfast.  Dispense: 90 tablet; Refill: 1 - Semaglutide (RYBELSUS) 3 MG TABS; Take 1 tablet (3 mg total) by mouth daily.  Dispense: 90 tablet; Refill: 1  No follow-ups on file.    Christina Rodney, FNP

## 2023-06-10 ENCOUNTER — Other Ambulatory Visit

## 2023-06-10 NOTE — Progress Notes (Deleted)
   06/10/2023 Name: Christina Gonzales MRN: [id number] DOB: January 17, 1946  No chief complaint on file.   {Visit Type:26650}   Subjective:  Care Team: Primary Care Provider: Yevette Hem, FNP ; Next Scheduled Visit: *** {careteamprovider:27366}  Medication Access/Adherence  Current Pharmacy:  [facility] [address], [place] - [address] [place] [address] Phone: [phone] Fax: [phone]   Patient reports affordability concerns with their medications: {YES/NO:21197} Patient reports access/transportation concerns to their pharmacy: {YES/NO:21197} Patient reports adherence concerns with their medications:  {YES/NO:21197} ***   Diabetes:  Current medications:  Medications tried in the past:   Current glucose readings: *** Using *** meter; testing *** times daily  Date of Download: *** % Time CGM is active: ***% Average Glucose: *** mg/dL Glucose Management Indicator: ***  Glucose Variability: *** (goal <36%) Time in Goal:  - Time in range 70-180: ***% - Time above range: ***% - Time below range: ***% Observed patterns:  Patient {Actions; denies-reports:120008} hypoglycemic s/sx including ***dizziness, shakiness, sweating. Patient {Actions; denies-reports:120008} hyperglycemic symptoms including ***polyuria, polydipsia, polyphagia, nocturia, neuropathy, blurred vision.  Current meal patterns:  - Breakfast: *** - Lunch *** - Supper *** - Snacks *** - Drinks ***  Current physical activity: ***  Current medication access support: *** Hypertension:  Current medications: *** Medications previously tried:   Patient {HAS/DOES NOT BMWU:13244} a validated, automated, upper arm home BP cuff Current blood pressure readings readings: ***  Patient {Actions; denies-reports:120008} hypotensive s/sx including ***dizziness, lightheadedness.  Patient {Actions; denies-reports:120008} hypertensive symptoms including ***headache, chest  pain, shortness of breath  Current meal patterns: ***  Current physical activity: ***   Hyperlipidemia/ASCVD Risk Reduction  Current lipid lowering medications:  Medications tried in the past:   Antiplatelet regimen:   ASCVD History:  Family History:  Risk Factors:   Current physical activity: ***  Current medication access support: ***  @[email or web address] @  PREVENT Risk Score: 10 year risk of CVD: *** - 10 year risk of ASCVD: *** - 10 year risk of HF: ***   Objective:  Lab Results  Component Value Date   HGBA1C 6.3 (H) 04/29/2023    Lab Results  Component Value Date   CREATININE 0.67 04/29/2023   BUN 19 04/29/2023   NA 140 04/29/2023   K 4.4 04/29/2023   CL 102 04/29/2023   CO2 23 04/29/2023    Lab Results  Component Value Date   CHOL 162 04/29/2023   HDL 63 04/29/2023   LDLCALC 78 04/29/2023   TRIG 116 04/29/2023   CHOLHDL 2.6 04/29/2023    Medications Reviewed Today   Medications were not reviewed in this encounter       Assessment/Plan:   {Pharmacy A/P Choices:26421}  Follow Up Plan: ***  Georga Killings, PharmD PGY-1 Pharmacy Resident

## 2023-06-18 ENCOUNTER — Telehealth: Payer: Self-pay

## 2023-06-18 NOTE — Progress Notes (Signed)
 Complex Care Management Care Guide Note  06/18/2023 Name: Christina Gonzales MRN: [id number] DOB: [birth year]  Christina Gonzales is a 78 y.o. year old female who is a primary care patient of Yevette Hem, FNP and is actively engaged with the care management team. I reached out to Nestora Baptise by phone today to assist with re-scheduling  with the Pharmacist.  Follow up plan: Patient declined rescheduling   Lenton Rail , RMA     [facility]  [facility], [facility] Guide  Direct Dial: [phone]  Website: [email or web address]

## 2023-06-28 ENCOUNTER — Other Ambulatory Visit: Payer: Self-pay | Admitting: Family

## 2023-06-28 DIAGNOSIS — I10 Essential (primary) hypertension: Secondary | ICD-10-CM

## 2023-07-05 ENCOUNTER — Other Ambulatory Visit: Payer: Self-pay | Admitting: Family

## 2023-07-05 DIAGNOSIS — E785 Hyperlipidemia, unspecified: Secondary | ICD-10-CM

## 2023-07-15 ENCOUNTER — Other Ambulatory Visit: Payer: Self-pay | Admitting: Family

## 2023-07-15 DIAGNOSIS — E785 Hyperlipidemia, unspecified: Secondary | ICD-10-CM

## 2023-08-21 ENCOUNTER — Ambulatory Visit (INDEPENDENT_AMBULATORY_CARE_PROVIDER_SITE_OTHER): Admitting: Family

## 2023-08-21 ENCOUNTER — Encounter: Payer: Self-pay | Admitting: Family

## 2023-08-21 VITALS — BP 131/67 | HR 98 | Temp 98.4°F | Ht 62.0 in | Wt 166.8 lb

## 2023-08-21 DIAGNOSIS — F5101 Primary insomnia: Secondary | ICD-10-CM

## 2023-08-21 DIAGNOSIS — R569 Unspecified convulsions: Secondary | ICD-10-CM | POA: Diagnosis not present

## 2023-08-21 DIAGNOSIS — E785 Hyperlipidemia, unspecified: Secondary | ICD-10-CM

## 2023-08-21 DIAGNOSIS — I10 Essential (primary) hypertension: Secondary | ICD-10-CM

## 2023-08-21 DIAGNOSIS — F411 Generalized anxiety disorder: Secondary | ICD-10-CM

## 2023-08-21 DIAGNOSIS — E1169 Type 2 diabetes mellitus with other specified complication: Secondary | ICD-10-CM | POA: Diagnosis not present

## 2023-08-21 DIAGNOSIS — F320 Major depressive disorder, single episode, mild: Secondary | ICD-10-CM

## 2023-08-21 DIAGNOSIS — Z0001 Encounter for general adult medical examination with abnormal findings: Secondary | ICD-10-CM

## 2023-08-21 DIAGNOSIS — Z86718 Personal history of other venous thrombosis and embolism: Secondary | ICD-10-CM

## 2023-08-21 DIAGNOSIS — Z0000 Encounter for general adult medical examination without abnormal findings: Secondary | ICD-10-CM

## 2023-08-21 DIAGNOSIS — E669 Obesity, unspecified: Secondary | ICD-10-CM

## 2023-08-21 LAB — BAYER DCA HB A1C WAIVED: HB A1C (BAYER DCA - WAIVED): 6.7 % — ABNORMAL HIGH (ref 4.8–5.6)

## 2023-08-21 MED ORDER — ATORVASTATIN CALCIUM 20 MG PO TABS: 20.0000 mg | ORAL_TABLET | Freq: Every day | ORAL | 2 refills | Status: AC

## 2023-08-21 MED ORDER — RAMELTEON 8 MG PO TABS: 8.0000 mg | ORAL_TABLET | Freq: Every day | ORAL | 1 refills | Status: AC

## 2023-08-21 MED ORDER — RYBELSUS 7 MG PO TABS: 7.0000 mg | ORAL_TABLET | Freq: Every day | ORAL | 1 refills | Status: AC

## 2023-08-21 NOTE — Progress Notes (Signed)
 Subjective:    Patient ID: Christina Gonzales, female    DOB: 12-04-1945, 78 y.o.   MRN: [id number]  Chief Complaint  Patient presents with   Medical Management of Chronic Issues   Pt presents to the office today with CPE and  chronic follow up.  She is followed by Neurologists for seizures annually.     She takes Eliquis  5 mg for hx of DVT.   Reports her daughter died throat cancer age 93 in 03-09-23.   Hypertension This is a chronic problem. The current episode started more than 1 year ago. The problem has been resolved since onset. The problem is controlled. Associated symptoms include anxiety and malaise/fatigue. Pertinent negatives include no blurred vision, peripheral edema or shortness of breath. Risk factors for coronary artery disease include dyslipidemia, diabetes mellitus and sedentary lifestyle. The current treatment provides moderate improvement.  Hyperlipidemia This is a chronic problem. The current episode started more than 1 year ago. The problem is controlled. Recent lipid tests were reviewed and are normal. Pertinent negatives include no shortness of breath. Current antihyperlipidemic treatment includes statins. The current treatment provides moderate improvement of lipids. Risk factors for coronary artery disease include dyslipidemia, diabetes mellitus, hypertension, a sedentary lifestyle and post-menopausal.  Diabetes She presents for her follow-up diabetic visit. She has type 2 diabetes mellitus. Hypoglycemia symptoms include nervousness/anxiousness. Pertinent negatives for diabetes include no blurred vision, no foot paresthesias and no foot ulcerations. Symptoms are stable. Risk factors for coronary artery disease include diabetes mellitus, dyslipidemia, hypertension, sedentary lifestyle and post-menopausal. She is following a generally unhealthy diet. (Does not check glucose at home)  Insomnia Primary symptoms: sleep disturbance, difficulty falling asleep, frequent  awakening, malaise/fatigue.   The current episode started more than one year. The onset quality is gradual. PMH includes: depression.   Anxiety Presents for follow-up visit. Symptoms include excessive worry, insomnia, nervous/anxious behavior and restlessness. Patient reports no shortness of breath. Symptoms occur occasionally. The severity of symptoms is mild.    Depression        This is a chronic problem.  The current episode started more than 1 year ago.   The problem occurs intermittently.  Associated symptoms include insomnia and restlessness.  Associated symptoms include no helplessness, no hopelessness and not sad.  Past treatments include nothing.  Past medical history includes anxiety.       Review of Systems  Constitutional:  Positive for malaise/fatigue.  Eyes:  Negative for blurred vision.  Respiratory:  Negative for shortness of breath.   Psychiatric/Behavioral:  Positive for depression and sleep disturbance. The patient is nervous/anxious and has insomnia.   All other systems reviewed and are negative.   Social History   Socioeconomic History   Marital status: Married    Spouse name: Elsie   Number of children: 3   Years of education: 12   Highest education level: 12th grade  Occupational History   Not on file  Tobacco Use   Smoking status: Never   Smokeless tobacco: Never  Vaping Use   Vaping status: Never Used  Substance and Sexual Activity   Alcohol use: No   Drug use: No   Sexual activity: Not Currently  Other Topics Concern   Not on file  Social History Narrative   Retired, lives with husband [place]. Two daughters living, one son deceased. Enjoys walking.    Social Drivers of Corporate investment banker Strain: Not on file  Food Insecurity: No Food Insecurity (06/13/2022)   Hunger  Vital Sign    Worried About Programme researcher, broadcasting/film/video in the Last Year: Never true    Ran Out of Food in the Last Year: Never true  Transportation Needs: No Transportation  Needs (06/13/2022)   PRAPARE - Administrator, Civil Service (Medical): No    Lack of Transportation (Non-Medical): No  Physical Activity: Not on file  Stress: Not on file  Social Connections: Not on file   Family History  Problem Relation Age of Onset   Cancer Mother        lung   Diabetes Brother         Objective:   Physical Exam Vitals reviewed.  Constitutional:      General: She is not in acute distress.    Appearance: She is well-developed.  HENT:     Head: Normocephalic and atraumatic.     Right Ear: Tympanic membrane normal.     Left Ear: Tympanic membrane normal.  Eyes:     Pupils: Pupils are equal, round, and reactive to light.  Neck:     Thyroid : No thyromegaly.  Cardiovascular:     Rate and Rhythm: Normal rate and regular rhythm.     Heart sounds: Normal heart sounds. No murmur heard. Pulmonary:     Effort: Pulmonary effort is normal. No respiratory distress.     Breath sounds: Normal breath sounds. No wheezing.  Abdominal:     General: Bowel sounds are normal. There is no distension.     Palpations: Abdomen is soft.     Tenderness: There is no abdominal tenderness.  Musculoskeletal:        General: No tenderness. Normal range of motion.     Cervical back: Normal range of motion and neck supple.  Skin:    General: Skin is warm and dry.  Neurological:     Mental Status: She is alert and oriented to person, place, and time.     Cranial Nerves: No cranial nerve deficit.     Deep Tendon Reflexes: Reflexes are normal and symmetric.  Psychiatric:        Behavior: Behavior normal.        Thought Content: Thought content normal.        Judgment: Judgment normal.       BP 131/67   Pulse 98   Temp 98.4 F (36.9 C) (Temporal)   Ht 5' 2 (1.575 m)   Wt 166 lb 12.8 oz (75.7 kg)   SpO2 99%   BMI 30.51 kg/m      Assessment & Plan:  DEVORY MCKINZIE comes in today with chief complaint of Medical Management of Chronic  Issues   Diagnosis and orders addressed:  1. Annual physical exam (Primary) - Bayer DCA Hb A1c Waived - CMP14+EGFR - Lipid panel - TSH  2. Essential hypertension, benign - CMP14+EGFR  3. Hyperlipidemia associated with type 2 diabetes mellitus (HCC) - CMP14+EGFR - Lipid panel  4. Seizures (HCC) - CMP14+EGFR  5. GAD (generalized anxiety disorder) - CMP14+EGFR  6. Obesity (BMI 30-39.9) - CMP14+EGFR  7. Current mild episode of major depressive disorder without prior episode (HCC) - CMP14+EGFR  8. History of deep vein thrombosis (DVT) of lower extremity - CMP14+EGFR  9. Primary insomnia Will try Remelteon Has tried ambien  5 mg, trazodone  100 mg, and doxepin  without success.  Sleep ritual  Avoid caffeine  - ramelteon  (ROZEREM ) 8 MG tablet; Take 1 tablet (8 mg total) by mouth at bedtime.  Dispense: 90 tablet; Refill:  1 - CMP14+EGFR  10. Type 2 diabetes mellitus with other specified complication, without long-term current use of insulin  (HCC) - Semaglutide  (RYBELSUS ) 7 MG TABS; Take 1 tablet (7 mg total) by mouth daily.  Dispense: 90 tablet; Refill: 1 - Bayer DCA Hb A1c Waived - CMP14+EGFR - TSH  11. Hyperlipidemia, unspecified hyperlipidemia type - atorvastatin  (LIPITOR) 20 MG tablet; Take 1 tablet (20 mg total) by mouth daily.  Dispense: 90 tablet; Refill: 2 - CMP14+EGFR    Labs pending Will try Remelteon Has tried ambien  5 mg, trazodone  100 mg, and doxepin  without success.  Sleep ritual  Avoid caffeine  Continue current medications  Keep follow up with specialists  Health Maintenance reviewed Diet and exercise encouraged  Return in about 3 months (around 11/21/2023), or if symptoms worsen or fail to improve.    Bari Learn, FNP

## 2023-08-21 NOTE — Patient Instructions (Signed)
 Insomnia Insomnia is a sleep disorder that makes it difficult to fall asleep or stay asleep. Insomnia can cause fatigue, low energy, difficulty concentrating, mood swings, and poor performance at work or school. There are three different ways to classify insomnia: Difficulty falling asleep. Difficulty staying asleep. Waking up too early in the morning. Any type of insomnia can be long-term (chronic) or short-term (acute). Both are common. Short-term insomnia usually lasts for 3 months or less. Chronic insomnia occurs at least three times a week for longer than 3 months. What are the causes? Insomnia may be caused by another condition, situation, or substance, such as: Having certain mental health conditions, such as anxiety and depression. Using caffeine, alcohol, tobacco, or drugs. Having gastrointestinal conditions, such as gastroesophageal reflux disease (GERD). Having certain medical conditions. These include: Asthma. Alzheimer's disease. Stroke. Chronic pain. An overactive thyroid gland (hyperthyroidism). Other sleep disorders, such as restless legs syndrome and sleep apnea. Menopause. Sometimes, the cause of insomnia may not be known. What increases the risk? Risk factors for insomnia include: Gender. Females are affected more often than males. Age. Insomnia is more common as people get older. Stress and certain medical and mental health conditions. Lack of exercise. Having an irregular work schedule. This may include working night shifts and traveling between different time zones. What are the signs or symptoms? If you have insomnia, the main symptom is having trouble falling asleep or having trouble staying asleep. This may lead to other symptoms, such as: Feeling tired or having low energy. Feeling nervous about going to sleep. Not feeling rested in the morning. Having trouble concentrating. Feeling irritable, anxious, or depressed. How is this diagnosed? This condition  may be diagnosed based on: Your symptoms and medical history. Your health care provider may ask about: Your sleep habits. Any medical conditions you have. Your mental health. A physical exam. How is this treated? Treatment for insomnia depends on the cause. Treatment may focus on treating an underlying condition that is causing the insomnia. Treatment may also include: Medicines to help you sleep. Counseling or therapy. Lifestyle adjustments to help you sleep better. Follow these instructions at home: Eating and drinking  Limit or avoid alcohol, caffeinated beverages, and products that contain nicotine and tobacco, especially close to bedtime. These can disrupt your sleep. Do not eat a large meal or eat spicy foods right before bedtime. This can lead to digestive discomfort that can make it hard for you to sleep. Sleep habits  Keep a sleep diary to help you and your health care provider figure out what could be causing your insomnia. Write down: When you sleep. When you wake up during the night. How well you sleep and how rested you feel the next day. Any side effects of medicines you are taking. What you eat and drink. Make your bedroom a dark, comfortable place where it is easy to fall asleep. Put up shades or blackout curtains to block light from outside. Use a white noise machine to block noise. Keep the temperature cool. Limit screen use before bedtime. This includes: Not watching TV. Not using your smartphone, tablet, or computer. Stick to a routine that includes going to bed and waking up at the same times every day and night. This can help you fall asleep faster. Consider making a quiet activity, such as reading, part of your nighttime routine. Try to avoid taking naps during the day so that you sleep better at night. Get out of bed if you are still awake after  15 minutes of trying to sleep. Keep the lights down, but try reading or doing a quiet activity. When you feel  sleepy, go back to bed. General instructions Take over-the-counter and prescription medicines only as told by your health care provider. Exercise regularly as told by your health care provider. However, avoid exercising in the hours right before bedtime. Use relaxation techniques to manage stress. Ask your health care provider to suggest some techniques that may work well for you. These may include: Breathing exercises. Routines to release muscle tension. Visualizing peaceful scenes. Make sure that you drive carefully. Do not drive if you feel very sleepy. Keep all follow-up visits. This is important. Contact a health care provider if: You are tired throughout the day. You have trouble in your daily routine due to sleepiness. You continue to have sleep problems, or your sleep problems get worse. Get help right away if: You have thoughts about hurting yourself or someone else. Get help right away if you feel like you may hurt yourself or others, or have thoughts about taking your own life. Go to your nearest emergency room or: Call 911. Call the National Suicide Prevention Lifeline at (906)021-1611 or 988. This is open 24 hours a day. Text the Crisis Text Line at 325-069-2793. Summary Insomnia is a sleep disorder that makes it difficult to fall asleep or stay asleep. Insomnia can be long-term (chronic) or short-term (acute). Treatment for insomnia depends on the cause. Treatment may focus on treating an underlying condition that is causing the insomnia. Keep a sleep diary to help you and your health care provider figure out what could be causing your insomnia. This information is not intended to replace advice given to you by your health care provider. Make sure you discuss any questions you have with your health care provider. Document Revised: 01/01/2021 Document Reviewed: 01/01/2021 Elsevier Patient Education  2024 [employer].

## 2023-08-22 ENCOUNTER — Ambulatory Visit: Payer: Self-pay | Admitting: Family

## 2023-08-22 ENCOUNTER — Telehealth: Payer: Self-pay

## 2023-08-22 LAB — LIPID PANEL
Chol/HDL Ratio: 2.8 ratio (ref 0.0–4.4)
Cholesterol, Total: 156 mg/dL (ref 100–199)
HDL: 55 mg/dL (ref >39)
LDL Chol Calc (NIH): 82 mg/dL (ref 0–99)
Triglycerides: 104 mg/dL (ref 0–149)
VLDL Cholesterol Cal: 19 mg/dL (ref 5–40)

## 2023-08-22 LAB — CMP14+EGFR
ALT: 34 IU/L — ABNORMAL HIGH (ref 0–32)
AST: 27 IU/L (ref 0–40)
Albumin: 4.2 g/dL (ref 3.8–4.8)
Alkaline Phosphatase: 36 IU/L — ABNORMAL LOW (ref 44–121)
BUN/Creatinine Ratio: 33 — ABNORMAL HIGH (ref 12–28)
BUN: 23 mg/dL (ref 8–27)
Bilirubin Total: 0.3 mg/dL (ref 0.0–1.2)
CO2: 22 mmol/L (ref 20–29)
Calcium: 9.5 mg/dL (ref 8.7–10.3)
Chloride: 104 mmol/L (ref 96–106)
Creatinine, Ser: 0.69 mg/dL (ref 0.57–1.00)
Globulin, Total: 2.7 g/dL (ref 1.5–4.5)
Glucose: 107 mg/dL — ABNORMAL HIGH (ref 70–99)
Potassium: 4.3 mmol/L (ref 3.5–5.2)
Sodium: 140 mmol/L (ref 134–144)
Total Protein: 6.9 g/dL (ref 6.0–8.5)
eGFR: 89 mL/min/1.73 (ref >59)

## 2023-08-22 LAB — TSH: TSH: 1.65 u[IU]/mL (ref 0.450–4.500)

## 2023-08-22 NOTE — Telephone Encounter (Signed)
 Copied from CRM [id number]. Topic: Clinical - Lab/Test Results >> Aug 22, 2023  9:49 AM Christina Gonzales wrote: Reason for CRM: pt returned called relayed message she has no questions at this time

## 2023-08-22 NOTE — Telephone Encounter (Signed)
 Result encounter updated

## 2023-08-28 ENCOUNTER — Encounter: Payer: Self-pay | Admitting: Family Medicine

## 2023-08-28 ENCOUNTER — Ambulatory Visit (INDEPENDENT_AMBULATORY_CARE_PROVIDER_SITE_OTHER): Admitting: Family Medicine

## 2023-08-28 ENCOUNTER — Ambulatory Visit: Payer: Self-pay

## 2023-08-28 VITALS — BP 130/58 | HR 61 | Temp 98.3°F | Ht 62.0 in | Wt 166.0 lb

## 2023-08-28 DIAGNOSIS — N760 Acute vaginitis: Secondary | ICD-10-CM

## 2023-08-28 DIAGNOSIS — R300 Dysuria: Secondary | ICD-10-CM

## 2023-08-28 LAB — MICROSCOPIC EXAMINATION
Bacteria, UA: NONE SEEN
RBC, Urine: NONE SEEN /HPF (ref 0–2)
Renal Epithel, UA: NONE SEEN /HPF
WBC, UA: NONE SEEN /HPF (ref 0–5)
Yeast, UA: NONE SEEN

## 2023-08-28 LAB — URINALYSIS, ROUTINE W REFLEX MICROSCOPIC
Bilirubin, UA: NEGATIVE
Leukocytes,UA: NEGATIVE
Nitrite, UA: NEGATIVE
Protein,UA: NEGATIVE
RBC, UA: NEGATIVE
Specific Gravity, UA: 1.02 (ref 1.005–1.030)
Urobilinogen, Ur: 1 mg/dL (ref 0.2–1.0)
pH, UA: 5.5 (ref 5.0–7.5)

## 2023-08-28 LAB — WET PREP FOR TRICH, YEAST, CLUE
Clue Cell Exam: NEGATIVE
Trichomonas Exam: NEGATIVE
Yeast Exam: POSITIVE — AB

## 2023-08-28 MED ORDER — FLUCONAZOLE 150 MG PO TABS
ORAL_TABLET | ORAL | 0 refills | Status: DC
Start: 2023-08-28 — End: 2023-11-04

## 2023-08-28 NOTE — Telephone Encounter (Signed)
 FYI Only or Action Required?: FYI only for provider.  Patient was last seen in primary care on 08/21/2023 by Lavell Bari LABOR, FNP.  Called Nurse Triage reporting No chief complaint on file..  Symptoms began yesterday.  Interventions attempted: Nothing.  Symptoms are: gradually worsening.  Triage Disposition: See Physician Within 24 Hours Has apt today Patient/caregiver understands and will follow disposition?: Yes  Patient/caregiver understands and will follow disposition?: Yes Reason for Disposition  Urinating more frequently than usual (i.e., frequency) OR new-onset of the feeling of an urgent need to urinate (i.e., urgency)  Answer Assessment - Initial Assessment Questions 1. SYMPTOM: What's the main symptom you're concerned about? (e.g., frequency, incontinence)     Soreness, frequency 2. ONSET: When did the  soreness  start?     yesterday 3. PAIN: Is there any pain? If Yes, ask: How bad is it? (Scale: 1-10; mild, moderate, severe)     mild 4. CAUSE: What do you think is causing the symptoms?     uti 5. OTHER SYMPTOMS: Do you have any other symptoms? (e.g., blood in urine, fever, flank pain, pain with urination)     Flank pain 6. PREGNANCY: Is there any chance you are pregnant? When was your last menstrual period?     na  Protocols used: Urinary Symptoms-A-AH

## 2023-08-28 NOTE — Progress Notes (Signed)
   Acute Office Visit  Subjective:     Patient ID: Christina Gonzales, female    DOB: 01-Sep-1945, 78 y.o.   MRN: [id number]  Chief Complaint  Patient presents with   Dysuria   Vaginitis    Dysuria  This is a new problem. The current episode started in the past 7 days. The problem occurs intermittently. The problem has been unchanged. The quality of the pain is described as burning. There has been no fever. Pertinent negatives include no chills, discharge, flank pain, frequency, hematuria, hesitancy, nausea, possible pregnancy, sweats, urgency or vomiting. Associated symptoms comments: Vaginal irritation. Treatments tried: medication wipes. The treatment provided no relief.    Review of Systems  Constitutional:  Negative for chills.  Gastrointestinal:  Negative for nausea and vomiting.  Genitourinary:  Positive for dysuria. Negative for flank pain, frequency, hematuria, hesitancy and urgency.        Objective:    BP (!) 130/58   Pulse 61   Temp 98.3 F (36.8 C) (Temporal)   Ht 5' 2 (1.575 m)   Wt 166 lb (75.3 kg)   SpO2 98%   BMI 30.36 kg/m    Physical Exam Vitals and nursing note reviewed.  Constitutional:      General: She is not in acute distress.    Appearance: She is not ill-appearing, toxic-appearing or diaphoretic.  Pulmonary:     Effort: Pulmonary effort is normal. No respiratory distress.  Abdominal:     General: Bowel sounds are normal. There is no distension.     Tenderness: There is no abdominal tenderness. There is no right CVA tenderness, left CVA tenderness, guarding or rebound.  Musculoskeletal:     Right lower leg: No edema.     Left lower leg: No edema.  Skin:    General: Skin is warm and dry.  Neurological:     General: No focal deficit present.     Mental Status: She is alert.  Psychiatric:        Mood and Affect: Mood normal.        Behavior: Behavior normal.    Urine dipstick shows positive for glucose and positive for ketones.   Micro exam: 0 WBC's per HPF, 0 RBC's per HPF, and no bacteria.  Microscopic wet-mount exam shows lactobacilli.      Assessment & Plan:   Christina Gonzales was seen today for dysuria and vaginitis.  Diagnoses and all orders for this visit:  Dysuria -     Urinalysis, Routine w reflex microscopic -     fluconazole  (DIFLUCAN ) 150 MG tablet; Take one tablet by mouth now. Repeat in 3 days if symptoms persist.  Acute vaginitis -     WET PREP FOR TRICH, YEAST, CLUE -     fluconazole  (DIFLUCAN ) 150 MG tablet; Take one tablet by mouth now. Repeat in 3 days if symptoms persist.   + yeast on wet prep. Negative UA. Diflucan  as above for yeast vaginitis.   Return if symptoms worsen or fail to improve.  Christina CHRISTELLA Search, FNP

## 2023-08-28 NOTE — Telephone Encounter (Signed)
 Copied from CRM #[id number]. Topic: Clinical - Medical Advice >> Aug 28, 2023  8:24 AM Myrick T wrote: Reason for CRM: patient called said she has some burning, itching and back pain. She thinks she has a UTI

## 2023-08-28 NOTE — Telephone Encounter (Signed)
 Appt noted

## 2023-09-15 ENCOUNTER — Inpatient Hospital Stay: Payer: Medicare Other | Attending: Physician Assistant

## 2023-09-15 DIAGNOSIS — D508 Other iron deficiency anemias: Secondary | ICD-10-CM

## 2023-09-15 DIAGNOSIS — R911 Solitary pulmonary nodule: Secondary | ICD-10-CM | POA: Insufficient documentation

## 2023-09-15 DIAGNOSIS — E559 Vitamin D deficiency, unspecified: Secondary | ICD-10-CM | POA: Diagnosis not present

## 2023-09-15 DIAGNOSIS — Z7901 Long term (current) use of anticoagulants: Secondary | ICD-10-CM | POA: Diagnosis not present

## 2023-09-15 DIAGNOSIS — E611 Iron deficiency: Secondary | ICD-10-CM | POA: Insufficient documentation

## 2023-09-15 DIAGNOSIS — Z86718 Personal history of other venous thrombosis and embolism: Secondary | ICD-10-CM | POA: Insufficient documentation

## 2023-09-15 DIAGNOSIS — E538 Deficiency of other specified B group vitamins: Secondary | ICD-10-CM | POA: Diagnosis not present

## 2023-09-15 LAB — CBC WITH DIFFERENTIAL/PLATELET
Abs Immature Granulocytes: 0.02 K/uL (ref 0.00–0.07)
Basophils Absolute: 0.1 K/uL (ref 0.0–0.1)
Basophils Relative: 1 %
Eosinophils Absolute: 0.2 K/uL (ref 0.0–0.5)
Eosinophils Relative: 2 %
HCT: 43.4 % (ref 36.0–46.0)
Hemoglobin: 14.5 g/dL (ref 12.0–15.0)
Immature Granulocytes: 0 %
Lymphocytes Relative: 37 %
Lymphs Abs: 3.3 K/uL (ref 0.7–4.0)
MCH: 29.7 pg (ref 26.0–34.0)
MCHC: 33.4 g/dL (ref 30.0–36.0)
MCV: 88.9 fL (ref 80.0–100.0)
Monocytes Absolute: 0.5 K/uL (ref 0.1–1.0)
Monocytes Relative: 6 %
Neutro Abs: 4.7 K/uL (ref 1.7–7.7)
Neutrophils Relative %: 54 %
Platelets: 190 K/uL (ref 150–400)
RBC: 4.88 MIL/uL (ref 3.87–5.11)
RDW: 12.5 % (ref 11.5–15.5)
WBC: 8.8 K/uL (ref 4.0–10.5)
nRBC: 0 % (ref 0.0–0.2)

## 2023-09-15 LAB — VITAMIN D 25 HYDROXY (VIT D DEFICIENCY, FRACTURES): Vit D, 25-Hydroxy: 55.76 ng/mL (ref 30–100)

## 2023-09-15 LAB — IRON AND TIBC
Iron: 91 ug/dL (ref 28–170)
Saturation Ratios: 25 % (ref 10.4–31.8)
TIBC: 364 ug/dL (ref 250–450)
UIBC: 273 ug/dL

## 2023-09-15 LAB — FOLATE: Folate: 11.8 ng/mL (ref >5.9)

## 2023-09-15 LAB — VITAMIN B12: Vitamin B-12: 235 pg/mL (ref 180–914)

## 2023-09-15 LAB — FERRITIN: Ferritin: 18 ng/mL (ref 11–307)

## 2023-09-18 ENCOUNTER — Other Ambulatory Visit: Payer: Self-pay | Admitting: Family

## 2023-09-18 DIAGNOSIS — I10 Essential (primary) hypertension: Secondary | ICD-10-CM

## 2023-09-21 LAB — METHYLMALONIC ACID, SERUM: Methylmalonic Acid, Quantitative: 170 nmol/L (ref 0–378)

## 2023-09-21 NOTE — Progress Notes (Unsigned)
 VIRTUAL VISIT via TELEPHONE NOTE [facility]   I connected with Christina Gonzales  on 09/22/23 at  1:45 PM by telephone and verified that I am speaking with the correct person using two identifiers.  Location: Patient: Home Provider: [facility]   I discussed the limitations, risks, security and privacy concerns of performing an evaluation and management service by telephone and the availability of in person appointments. I also discussed with the patient that there may be a patient responsible charge related to this service. The patient expressed understanding and agreed to proceed.  REASON FOR VISIT: Recurrent DVTs, B12 deficiency, vitamin D  deficiency, iron deficiency   CURRENT THERAPY: Eliquis , vitamin B12 2000 mcg daily, vitamin D  50,000 units weekly  INTERVAL HISTORY:   [name] 78 y.o. female returns for routine follow-up of  her recurrent DVTs, B12 deficiency, and vitamin D  deficiency.   She was last seen in clinic by Pleasant Barefoot PA-C on 03/17/2023.   At today's visit, she reports feeling fairly well.   She stays active working with [employer] and caring for her husband, but is grieving the loss of her daughter who passed away in 2023/03/08.  She has not had any episodes of DVT or PE in the interim since her last visit.   She denies any current symptoms of DVT such as unilateral leg swelling, pain, and erythema.    No current symptoms of PE such as shortness of breath, dyspnea on exertion, chest pain, hemoptysis, and palpitations.  She remains on Eliquis  twice daily.   She reports easy bruising, but denies any symptoms of bleeding such as rectal bleeding or melena.   She has occasional epistaxis, lasting less than 5 minutes. She denies any recent falls. She denies any abnormal fatigue or ice pica. She has been taking vitamin B12 2000 mcg daily and vitamin D  50,000 units once weekly. She is somewhat uncertain whether  or not she is taking iron supplement.   She has 85% energy and 100% appetite.  She endorses that she is maintaining a stable weight.  ASSESSMENT & PLAN:  1.  History of recurrent DVTs: - Patient had a previous DVT in the right leg in 2011.  She was treated with 4 to 6 months of anticoagulation with resolution of the clot. - She was then diagnosed in 2019 with a left lower leg extremity DVT on a Doppler that was done 07/31/2017 that showed extensive left lower extremity DVT extending from the left peroneal vein through the femoral popliteal system. - She denied any recent extensive travel.  She was not on any estrogen supplementation.  She denies a history of miscarriages.  She denies any recent surgeries.  She denies a family history of blood clots, heart attacks, or strokes. - It was deemed to be an unprovoked DVT. - Hypercoagulable workup NEGATIVE: Negative for factor V Leiden, PT gene mutation, beta-2  glycoprotein, and negative LA. - She has been on Eliquis  since 2019 due to history of recurrent unprovoked - She remains on Eliquis  to date, tolerating well without any major bleeding events.  No falls within the past year. - No current symptoms of acute DVT or PE. - Most recent labs show normal kidney function, normal CBC, and undetectable D-dimer <0.27 - PLAN:  Continue Eliquis  with periodic (annual) reassessment of ongoing risk/benefits of indefinite anticoagulation.  Patient is aware of alarm symptoms that would prompt immediate medical attention. - Once her vitamin/mineral deficiencies improve/stabilize, she  would otherwise be eligible for discharge to PCP.   2.  Pulmonary nodule: - Patient had a CT of the chest done on 09/04/2017 which showed 2 new nodules in the LLL largest measuring 6 mm. - Patient had a repeat CT of the chest in December 2019 which showed nodules are stable at this time. - CT of chest on 02/16/2019 showed development of a new 3 mm right upper lobe pulmonary nodule.   Follow-up with noncontrast CT in 12 months.  6 mm left apical nodule remains stable from previous scans it is characterized as benign. - Patient declined follow-up CT chest for nodule - She did have CTA chest done on 06/12/2022 which showed 5 mm left apical lung nodule stable, no imaging follow-up recommended.  Additional findings were consistent with respiratory infection. - She denies any new cough, changes in breathing, hemoptysis, or unintentional weight loss. - PLAN: No further follow-up at this time.   3.  Vitamin B12 deficiency: - Prior labs (01/10/2022): Vitamin B12 level low at 155, MMA elevated at 525 - She is taking vitamin B12 2000 mcg daily - Most recent labs (09/15/2023): Vitamin B12 marginal at 235, MMA normalized.  Normal folate. - PLAN: Recommend INCREASING vitamin B12 supplement to 3,000 mcg SL daily.   4.  Vitamin D  deficiency: - She is currently taking vitamin D  50,000 units every week - Most recent labs (09/15/2023) show normal vitamin D  at 55.76 - PLAN: Continue vitamin D  50,000 units every week   5.  Iron deficiency - Labs from 03/10/2023 showed ferritin 26, iron saturation 17%.  No anemia - She is not sure if she is taking her iron pills - Most recent labs (09/15/2023) negative for anemia, but with persistently low iron with ferritin 18, iron saturation 25%. - Denies any rectal bleeding or melena.  No fatigue, but has some aching leg pain at night. - PLAN: Recommend starting ferrous sulfate  325 mg every day if tolerated, otherwise every other day.  Rx sent to pharmacy.    PLAN SUMMARY:  >> Labs in 6 months = CBC/D, ferritin, iron/TIBC, B12, MMA >> OFFICE visit in 6 months (1 week after labs)  **Last office visit 03/17/2023 (annual office visits)      REVIEW OF SYSTEMS:   Review of Systems  Constitutional:  Negative for chills, diaphoresis, fever, malaise/fatigue and weight loss.  Respiratory:  Negative for cough and shortness of breath.   Cardiovascular:  Negative  for chest pain and palpitations.  Gastrointestinal:  Negative for abdominal pain, blood in stool, melena, nausea and vomiting.  Neurological:  Positive for headaches (occasional). Negative for dizziness.     PHYSICAL EXAM: (per limitations of virtual telephone visit)  The patient is alert and oriented x 3, exhibiting adequate mentation, good mood, and ability to speak in full sentences and execute sound judgement.  WRAP UP:   I discussed the assessment and treatment plan with the patient. The patient was provided an opportunity to ask questions and all were answered. The patient agreed with the plan and demonstrated an understanding of the instructions.   The patient was advised to call back or seek an in-person evaluation if the symptoms worsen or if the condition fails to improve as anticipated.  I provided 22 minutes of non-face-to-face time during this encounter, including > 10 minutes of medical discussion.  Pleasant CHRISTELLA Barefoot, PA-C 09/22/23 2:28 PM

## 2023-09-22 ENCOUNTER — Inpatient Hospital Stay: Payer: Medicare Other | Admitting: Physician Assistant

## 2023-09-22 DIAGNOSIS — E538 Deficiency of other specified B group vitamins: Secondary | ICD-10-CM | POA: Diagnosis not present

## 2023-09-22 DIAGNOSIS — D508 Other iron deficiency anemias: Secondary | ICD-10-CM

## 2023-09-22 DIAGNOSIS — E559 Vitamin D deficiency, unspecified: Secondary | ICD-10-CM | POA: Diagnosis not present

## 2023-09-22 DIAGNOSIS — Z86718 Personal history of other venous thrombosis and embolism: Secondary | ICD-10-CM

## 2023-09-22 MED ORDER — VITAMIN B-12 3000 MCG SL SUBL: 3000.0000 ug | SUBLINGUAL_TABLET | Freq: Every day | SUBLINGUAL | 3 refills | Status: AC

## 2023-09-22 MED ORDER — APIXABAN 5 MG PO TABS: 5.0000 mg | ORAL_TABLET | Freq: Two times a day (BID) | ORAL | 11 refills | Status: AC

## 2023-09-22 MED ORDER — FERROUS SULFATE 325 (65 FE) MG PO TBEC: 325.0000 mg | DELAYED_RELEASE_TABLET | Freq: Every day | ORAL | 3 refills | Status: DC

## 2023-09-22 MED ORDER — VITAMIN D (ERGOCALCIFEROL) 1.25 MG (50000 UNIT) PO CAPS: 50000.0000 [IU] | ORAL_CAPSULE | ORAL | 1 refills | Status: AC

## 2023-11-04 ENCOUNTER — Ambulatory Visit: Admitting: Nurse Practitioner

## 2023-11-04 ENCOUNTER — Encounter: Payer: Self-pay | Admitting: Nurse Practitioner

## 2023-11-04 ENCOUNTER — Ambulatory Visit: Payer: Self-pay

## 2023-11-04 VITALS — BP 132/66 | HR 88 | Temp 97.8°F | Ht 62.0 in | Wt 166.0 lb

## 2023-11-04 DIAGNOSIS — B9689 Other specified bacterial agents as the cause of diseases classified elsewhere: Secondary | ICD-10-CM

## 2023-11-04 DIAGNOSIS — N760 Acute vaginitis: Secondary | ICD-10-CM | POA: Diagnosis not present

## 2023-11-04 DIAGNOSIS — B3731 Acute candidiasis of vulva and vagina: Secondary | ICD-10-CM | POA: Diagnosis not present

## 2023-11-04 DIAGNOSIS — R300 Dysuria: Secondary | ICD-10-CM | POA: Diagnosis not present

## 2023-11-04 LAB — URINALYSIS, ROUTINE W REFLEX MICROSCOPIC
Bilirubin, UA: NEGATIVE
Ketones, UA: NEGATIVE
Nitrite, UA: NEGATIVE
Protein,UA: NEGATIVE
RBC, UA: NEGATIVE
Specific Gravity, UA: 1.015 (ref 1.005–1.030)
Urobilinogen, Ur: 1 mg/dL (ref 0.2–1.0)
pH, UA: 6.5 (ref 5.0–7.5)

## 2023-11-04 LAB — MICROSCOPIC EXAMINATION
RBC, Urine: NONE SEEN /HPF (ref 0–2)
Renal Epithel, UA: NONE SEEN /HPF

## 2023-11-04 MED ORDER — METRONIDAZOLE 500 MG PO TABS: 500.0000 mg | ORAL_TABLET | Freq: Three times a day (TID) | ORAL | 0 refills | Status: AC

## 2023-11-04 MED ORDER — FLUCONAZOLE 150 MG PO TABS: ORAL_TABLET | ORAL | 0 refills | Status: DC

## 2023-11-04 NOTE — Progress Notes (Signed)
   Subjective:    Patient ID: Christina Gonzales, female    DOB: [birth year], 78 y.o.   MRN: [id number]   Chief Complaint: Dysuria (Started last week)   Dysuria  This is a new problem. The current episode started in the past 7 days. The problem has been waxing and waning. The pain is at a severity of 5/10. The pain is mild. There has been no fever. She is Not sexually active. There is No history of pyelonephritis. Associated symptoms include frequency, hesitancy and urgency. She has tried nothing for the symptoms. The treatment provided mild relief.    Patient Active Problem List   Diagnosis Date Noted   Diabetes mellitus (HCC) 06/13/2022   Primary insomnia 05/31/2021   History of deep vein thrombosis (DVT) of lower extremity 06/11/2018   Current mild episode of major depressive disorder without prior episode 03/03/2018   Grief 03/03/2018   Seborrheic keratosis 02/26/2017   Obesity (BMI 30-39.9) 05/10/2016   GAD (generalized anxiety disorder) 11/14/2014   Hyperlipidemia associated with type 2 diabetes mellitus (HCC) 09/01/2013   Essential hypertension, benign 09/01/2013   Seizures (HCC) 09/01/2013   Meningioma (HCC) 02/27/2012      Review of Systems  Genitourinary:  Positive for dysuria, frequency, hesitancy and urgency.       Objective:   Physical Exam Constitutional:      Appearance: Normal appearance.  Cardiovascular:     Rate and Rhythm: Normal rate and regular rhythm.     Heart sounds: Normal heart sounds.  Pulmonary:     Breath sounds: Normal breath sounds.  Skin:    General: Skin is warm.  Neurological:     General: No focal deficit present.     Mental Status: She is alert and oriented to person, place, and time.  Psychiatric:        Mood and Affect: Mood normal.        Behavior: Behavior normal.    BP 132/66   Pulse 88   Temp 97.8 F (36.6 C) (Temporal)   Ht 5' 2 (1.575 m)   Wt 166 lb (75.3 kg)   SpO2 97%   BMI 30.36 kg/m         Assessment  & Plan:   Christina Gonzales in today with chief complaint of Dysuria (Started last week)   1. Dysuria (Primary) Force fluids - Urinalysis, Routine w reflex microscopic - Urine Culture  2. Vaginal candidiasis No bubble baths - fluconazole  (DIFLUCAN ) 150 MG tablet; 1 po q week x 4 weeks  Dispense: 4 tablet; Refill: 0  3. Bacterial vaginosis Take all of antibiotic RTO prn - metroNIDAZOLE (FLAGYL) 500 MG tablet; Take 1 tablet (500 mg total) by mouth 3 (three) times daily for 7 days.  Dispense: 14 tablet; Refill: 0    The above assessment and management plan was discussed with the patient. The patient verbalized understanding of and has agreed to the management plan. Patient is aware to call the clinic if symptoms persist or worsen. Patient is aware when to return to the clinic for a follow-up visit. Patient educated on when it is appropriate to go to the emergency department.   Mary-Margaret Gladis, FNP

## 2023-11-04 NOTE — Telephone Encounter (Signed)
 Appt made.

## 2023-11-04 NOTE — Patient Instructions (Signed)

## 2023-11-04 NOTE — Telephone Encounter (Signed)
 FYI Only or Action Required?: FYI only for provider.  Patient was last seen in primary care on 08/28/2023 by Christina Annabella HERO, FNP.  Called Nurse Triage reporting Fever and Dysuria.  Symptoms began today.  Interventions attempted: Nothing.  Symptoms are: gradually worsening.  Triage Disposition: See HCP Within 4 Hours (Or PCP Triage)  Patient/caregiver understands and will follow disposition?: Yes                             Copied from CRM [id number]. Topic: Clinical - Red Word Triage >> Nov 04, 2023  8:31 AM Larissa S wrote: [facility] that prompted transfer to Nurse Triage: fever, body aches, pain and burning with urination Reason for Disposition  Side (flank) or lower back pain present  Answer Assessment - Initial Assessment Questions 1. SEVERITY: How bad is the pain?  (e.g., Scale 1-10; mild, moderate, or severe)     Rates pain a 3 2. FREQUENCY: How many times have you had painful urination today?      1 x  3. PATTERN: Is pain present every time you urinate or just sometimes?      Just sometimes  4. ONSET: When did the painful urination start?      This morning 5. FEVER: Do you have a fever? If Yes, ask: What is your temperature, how was it measured, and when did it start?     States she feels slightly feverish  6. PAST UTI: Have you had a urine infection before? If Yes, ask: When was the last time? and What happened that time?      Yes, states she gets frequent UTIs 7. CAUSE: What do you think is causing the painful urination?  (e.g., UTI, scratch, Herpes sore)     UTI 8. OTHER SYMPTOMS: Do you have any other symptoms? (e.g., blood in urine, flank pain, genital sores, urgency, vaginal discharge)     Body aches (states she received her flu and COVID vaccine yesterday), lower back/tailbone pain, urinary frequency, denies hematuria, denies abdominal pain 9. PREGNANCY: Is there any chance you are pregnant? When was your last  menstrual period?     N/A  Protocols used: Urination Pain - Female-A-AH

## 2023-11-06 LAB — URINE CULTURE

## 2023-11-20 ENCOUNTER — Ambulatory Visit

## 2023-11-20 VITALS — BP 132/66 | HR 88 | Ht 62.0 in | Wt 166.0 lb

## 2023-11-20 DIAGNOSIS — Z0000 Encounter for general adult medical examination without abnormal findings: Secondary | ICD-10-CM

## 2023-11-20 NOTE — Patient Instructions (Signed)
 Christina Gonzales,  Thank you for taking the time for your Medicare Wellness Visit. I appreciate your continued commitment to your health goals. Please review the care plan we discussed, and feel free to reach out if I can assist you further.  Medicare recommends these wellness visits once per year to help you and your care team stay ahead of potential health issues. These visits are designed to focus on prevention, allowing your provider to concentrate on managing your acute and chronic conditions during your regular appointments.  Please note that Annual Wellness Visits do not include a physical exam. Some assessments may be limited, especially if the visit was conducted virtually. If needed, we may recommend a separate in-person follow-up with your provider.  Ongoing Care Seeing your primary care provider every 3 to 6 months helps us  monitor your health and provide consistent, personalized care.   Referrals If a referral was made during today's visit and you haven't received any updates within two weeks, please contact the referred provider directly to check on the status.  Recommended Screenings:  Health Maintenance  Topic Date Due   DEXA scan (bone density measurement)  Never done   Medicare Annual Wellness Visit  08/10/2021   Yearly kidney health urinalysis for diabetes  09/27/2023   Hemoglobin A1C  02/21/2024   Eye exam for diabetics  04/22/2024   COVID-19 Vaccine (7 - Moderna risk 2025-26 season) 05/02/2024   Yearly kidney function blood test for diabetes  08/20/2024   Complete foot exam   08/20/2024   DTaP/Tdap/Td vaccine (2 - Td or Tdap) 11/14/2031   Pneumococcal Vaccine for age over 79  Completed   Flu Shot  Completed   Hepatitis C Screening  Completed   Zoster (Shingles) Vaccine  Completed   Meningitis B Vaccine  Aged Out   Breast Cancer Screening  Discontinued   Colon Cancer Screening  Discontinued   Cologuard (Stool DNA test)  Discontinued       11/20/2023    8:34 AM   Advanced Directives  Does Patient Have a Medical Advance Directive? No   Advance Care Planning is important because it: Ensures you receive medical care that aligns with your values, goals, and preferences. Provides guidance to your family and loved ones, reducing the emotional burden of decision-making during critical moments.  Vision: Annual vision screenings are recommended for early detection of glaucoma, cataracts, and diabetic retinopathy. These exams can also reveal signs of chronic conditions such as diabetes and high blood pressure.  Dental: Annual dental screenings help detect early signs of oral cancer, gum disease, and other conditions linked to overall health, including heart disease and diabetes.  Please see the attached documents for additional preventive care recommendations.

## 2023-11-20 NOTE — Progress Notes (Signed)
 Subjective:   Christina Gonzales is a 79 y.o. who presents for a Medicare Wellness preventive visit.  As a reminder, Annual Wellness Visits don't include a physical exam, and some assessments may be limited, especially if this visit is performed virtually. We may recommend an in-person follow-up visit with your provider if needed.  Visit Complete: Virtual I connected with  Christina Gonzales on 11/20/23 by a audio enabled telemedicine application and verified that I am speaking with the correct person using two identifiers.  Patient Location: Home  Provider Location: Home Office  I discussed the limitations of evaluation and management by telemedicine. The patient expressed understanding and agreed to proceed.  Vital Signs: Because this visit was a virtual/telehealth visit, some criteria may be missing or patient reported. Any vitals not documented were not able to be obtained and vitals that have been documented are patient reported.  VideoDeclined- This patient declined Librarian, academic. Therefore the visit was completed with audio only.  Persons Participating in Visit: Patient.  AWV Questionnaire: No: Patient Medicare AWV questionnaire was not completed prior to this visit.  Cardiac Risk Factors include: advanced age (>40men, >99 women);diabetes mellitus;dyslipidemia;hypertension;obesity (BMI >30kg/m2)     Objective:    Today's Vitals   11/20/23 0829  BP: 132/66  Pulse: 88  Weight: 166 lb (75.3 kg)  Height: 5' 2 (1.575 m)   Body mass index is 30.36 kg/m.     11/20/2023    8:34 AM 09/22/2023    1:09 PM 03/17/2023   10:18 AM 09/12/2022   11:14 AM 06/12/2022    5:59 PM 01/16/2022    8:49 AM 08/10/2020   10:39 AM  Advanced Directives  Does Patient Have a Medical Advance Directive? No Yes No No No No Yes  Type of Furniture conservator/restorer;Living will     Living will  Does patient want to make changes to medical advance  directive?       No - Patient declined  Copy of Healthcare Power of Attorney in Chart?  No - copy requested       Would patient like information on creating a medical advance directive?    No - Patient declined No - Patient declined No - Patient declined     Current Medications (verified) Outpatient Encounter Medications as of 11/20/2023  Medication Sig   amLODipine  (NORVASC ) 10 MG tablet Take 1 tablet (10 mg total) by mouth daily.   apixaban  (ELIQUIS ) 5 MG TABS tablet Take 1 tablet (5 mg total) by mouth 2 (two) times daily.   atorvastatin  (LIPITOR) 20 MG tablet Take 1 tablet (20 mg total) by mouth daily.   blood glucose meter kit and supplies Dispense based on patient and insurance preference. Use up to four times daily as directed. (FOR ICD-10 E10.9, E11.9).   Blood Glucose Monitoring Suppl DEVI 1 each by Does not apply route in the morning, at noon, and at bedtime. May substitute to any manufacturer covered by patient's insurance.   Cyanocobalamin  (VITAMIN B-12) 3000 MCG SUBL Place 3,000 mcg under the tongue daily.   dapagliflozin  propanediol (FARXIGA ) 10 MG TABS tablet Take 1 tablet (10 mg total) by mouth daily before breakfast.   ferrous sulfate  325 (65 FE) MG EC tablet Take 1 tablet (325 mg total) by mouth daily with breakfast. Take with orange juice or Vitamin C to improve absorption.  Can decrease to every other day if experiencing constipation.  Take with food to minimize GI upset.  fluconazole  (DIFLUCAN ) 150 MG tablet 1 po q week x 4 weeks   Lancets (ONETOUCH DELICA PLUS LANCET33G) MISC Check twice a day and as needed   levETIRAcetam  (KEPPRA ) 500 MG tablet Take 500 mg by mouth 2 (two) times daily.   lisinopril  (ZESTRIL ) 40 MG tablet Take 1 tablet by mouth once daily   Multiple Vitamins tablet Take 1 tablet by mouth. Reported on 05/26/2015   [facility] ULTRA test strip 1 each by Other route 4 (four) times daily.   ramelteon  (ROZEREM ) 8 MG tablet Take 1 tablet (8 mg total) by mouth at  bedtime.   Semaglutide  (RYBELSUS ) 7 MG TABS Take 1 tablet (7 mg total) by mouth daily.   Vitamin D , Ergocalciferol , (DRISDOL ) 1.25 MG (50000 UNIT) CAPS capsule Take 1 capsule (50,000 Units total) by mouth every 14 (fourteen) days.   No facility-administered encounter medications on file as of 11/20/2023.    Allergies (verified) Heparin, Warfarin, and Penicillins   History: Past Medical History:  Diagnosis Date   Anxiety    DVT (deep venous thrombosis) (HCC)    Hyperlipidemia    Seizures (HCC)    Past Surgical History:  Procedure Laterality Date   TUMOR EXCISION     Behind left eye   Family History  Problem Relation Age of Onset   Cancer Mother        lung   Diabetes Brother    Social History   Socioeconomic History   Marital status: Married    Spouse name: Elsie   Number of children: 3   Years of education: 12   Highest education level: 12th grade  Occupational History   Not on file  Tobacco Use   Smoking status: Never   Smokeless tobacco: Never  Vaping Use   Vaping status: Never Used  Substance and Sexual Activity   Alcohol use: No   Drug use: No   Sexual activity: Not Currently  Other Topics Concern   Not on file  Social History Narrative   Retired, lives with husband [place]. Two daughters living, one son deceased. Enjoys walking.    Social Drivers of Corporate investment banker Strain: Low Risk  (11/20/2023)   Overall Financial Resource Strain (CARDIA)    Difficulty of Paying Living Expenses: Not hard at all  Food Insecurity: No Food Insecurity (11/20/2023)   Hunger Vital Sign    Worried About Running Out of Food in the Last Year: Never true    Ran Out of Food in the Last Year: Never true  Transportation Needs: No Transportation Needs (11/20/2023)   PRAPARE - Administrator, Civil Service (Medical): No    Lack of Transportation (Non-Medical): No  Physical Activity: Sufficiently Active (11/20/2023)   Exercise Vital Sign    Days of  Exercise per Week: 6 days    Minutes of Exercise per Session: 30 min  Stress: No Stress Concern Present (11/20/2023)   [employer] of Occupational Health - Occupational Stress Questionnaire    Feeling of Stress: Not at all  Social Connections: Socially Integrated (11/20/2023)   Social Connection and Isolation Panel    Frequency of Communication with Friends and Family: More than three times a week    Frequency of Social Gatherings with Friends and Family: More than three times a week    Attends Religious Services: More than 4 times per year    Active Member of [employer] or Organizations: Yes    Attends Banker Meetings: More than 4 times per  year    Marital Status: Married    Tobacco Counseling Counseling given: Yes    Clinical Intake:  Pre-visit preparation completed: Yes  Pain : No/denies pain     BMI - recorded: 30.36 Nutritional Status: BMI > 30  Obese Nutritional Risks: None Diabetes: Yes  Lab Results  Component Value Date   HGBA1C 6.7 (H) 08/21/2023   HGBA1C 6.3 (H) 04/29/2023   HGBA1C 6.4 (H) 09/27/2022     How often do you need to have someone help you when you read instructions, pamphlets, or other written materials from your doctor or pharmacy?: 1 - Never  Interpreter Needed?: No  Information entered by :: alia t/cma   Activities of Daily Living     11/20/2023    8:32 AM  In your present state of health, do you have any difficulty performing the following activities:  Hearing? 0  Vision? 0  Difficulty concentrating or making decisions? 0  Walking or climbing stairs? 0  Dressing or bathing? 0  Doing errands, shopping? 0  Preparing Food and eating ? N  Using the Toilet? N  In the past six months, have you accidently leaked urine? N  Do you have problems with loss of bowel control? N  Managing your Medications? N  Managing your Finances? N  Housekeeping or managing your Housekeeping? N    Patient Care Team: Lavell Bari LABOR,  FNP as PCP - General (Family Medicine) Shaaron Lamar HERO, MD as Consulting Physician (Gastroenterology) Billee Mliss BIRCH, [facility] as Pharmacist (Family Medicine) Vicci Mcardle, OD (Optometry)  I have updated your Care Teams any recent Medical Services you may have received from other providers in the past year.     Assessment:   This is a routine wellness examination for [place].  Hearing/Vision screen Hearing Screening - Comments:: Pt denies hearing dif Vision Screening - Comments:: Pt wear glasses/[facility] in [place], [place]/last 2025   Goals Addressed             This Visit's Progress    Patient Stated   On track    08/10/2020 AWV Goal: Fall Prevention  Over the next year, patient will decrease their risk for falls by: Using assistive devices, such as a cane or walker, as needed Identifying fall risks within their home and correcting them by: Removing throw rugs Adding handrails to stairs or ramps Removing clutter and keeping a clear pathway throughout the home Increasing light, especially at night Adding shower handles/bars Raising toilet seat Identifying potential personal risk factors for falls: Medication side effects Incontinence/urgency Vestibular dysfunction Hearing loss Musculoskeletal disorders Neurological disorders Orthostatic hypotension       Protect My Health   On track      Depression Screen     11/20/2023    8:35 AM 11/04/2023   10:46 AM 09/22/2023    1:07 PM 08/28/2023    1:42 PM 08/21/2023    8:45 AM 04/29/2023    8:34 AM 09/27/2022    8:53 AM  PHQ 2/9 Scores  PHQ - 2 Score 0 0 0 0 0 0 0  PHQ- 9 Score    0 0 3 1    Fall Risk     11/20/2023    8:32 AM 11/04/2023   10:46 AM 08/28/2023    1:42 PM 08/21/2023    8:44 AM 06/27/2022    9:44 AM  Fall Risk   Falls in the past year? 0 0 0 0 0  Number falls in past yr: 0  0 0  Injury with Fall? 0   0 0  Risk for fall due to : No Fall Risks   No Fall Risks No Fall Risks  Follow up Falls evaluation  completed   Falls evaluation completed Falls evaluation completed    MEDICARE RISK AT HOME:  Medicare Risk at Home Any stairs in or around the home?: No If so, are there any without handrails?: No Home free of loose throw rugs in walkways, pet beds, electrical cords, etc?: Yes Adequate lighting in your home to reduce risk of falls?: Yes Life alert?: No Use of a cane, walker or w/c?: No Grab bars in the bathroom?: No Shower chair or bench in shower?: No Elevated toilet seat or a handicapped toilet?: No  TIMED UP AND GO:  Was the test performed?  no  Cognitive Function: 6CIT completed due to disconnection in services/try to reach pt again, was unsuccessful.         11/20/2023    8:38 AM 08/10/2020   10:41 AM 08/04/2019    9:39 AM  6CIT Screen  What Year? 0 points 0 points 0 points  What month? 0 points  0 points  What time? 0 points 0 points 0 points  Count back from 20 0 points 0 points 4 points  Months in reverse 0 points 0 points 4 points  Repeat phrase 0 points 0 points 0 points  Total Score 0 points  8 points    Immunizations Immunization History  Administered Date(s) Administered   Fluad Quad(high Dose 65+) 11/19/2018, 12/14/2019, 11/20/2020, 11/06/2021   INFLUENZA, HIGH DOSE SEASONAL PF 11/10/2013   Influenza Split 04/05/2009   Influenza,inj,Quad PF,6+ Mos 11/14/2014   Influenza-Unspecified 04/05/2009, 11/03/2023   Moderna Covid-19 Fall Seasonal Vaccine 49yrs & older 12/25/2021, 12/30/2022, 11/03/2023   Moderna SARS-COV2 Booster Vaccination 11/03/2023   Moderna Sars-Covid-2 Vaccination 04/09/2019, 05/10/2019, 12/28/2019   Pneumococcal Conjugate-13 11/14/2014   Pneumococcal Polysaccharide-23 04/05/2009, 11/18/2017   Tdap 11/13/2021   Zoster Recombinant(Shingrix) 11/19/2018, 01/16/2019   Zoster, Live 12/19/2010    Screening Tests Health Maintenance  Topic Date Due   DEXA SCAN  Never done   Diabetic kidney evaluation - Urine ACR  09/27/2023   HEMOGLOBIN A1C   02/21/2024   OPHTHALMOLOGY EXAM  04/22/2024   COVID-19 Vaccine (7 - Moderna risk 2025-26 season) 05/02/2024   Diabetic kidney evaluation - eGFR measurement  08/20/2024   FOOT EXAM  08/20/2024   Medicare Annual Wellness (AWV)  11/19/2024   DTaP/Tdap/Td (2 - Td or Tdap) 11/14/2031   Pneumococcal Vaccine: 50+ Years  Completed   Influenza Vaccine  Completed   Hepatitis C Screening  Completed   Zoster Vaccines- Shingrix  Completed   Meningococcal B Vaccine  Aged Out   Mammogram  Discontinued   Colonoscopy  Discontinued   Fecal DNA (Cologuard)  Discontinued    Health Maintenance Items Addressed: See Nurse Notes at the end of this note  Additional Screening:  Vision Screening: Recommended annual ophthalmology exams for early detection of glaucoma and other disorders of the eye. Is the patient up to date with their annual eye exam?  Yes  Who is the provider or what is the name of the office in which the patient attends annual eye exams? [facility] in [facility]  Dental Screening: Recommended annual dental exams for proper oral hygiene  Community Resource Referral / Chronic Care Management: CRR required this visit?  No   CCM required this visit?  No   Plan:    I  have personally reviewed and noted the following in the patient's chart:   Medical and social history Use of alcohol, tobacco or illicit drugs  Current medications and supplements including opioid prescriptions. Patient is not currently taking opioid prescriptions. Functional ability and status Nutritional status Physical activity Advanced directives List of other physicians Hospitalizations, surgeries, and ER visits in previous 12 months Vitals Screenings to include cognitive, depression, and falls Referrals and appointments  In addition, I have reviewed and discussed with patient certain preventive protocols, quality metrics, and best practice recommendations. A written personalized care plan for preventive services  as well as general preventive health recommendations were provided to patient.   Ozie Ned, CMA   11/20/2023   After Visit Summary: (MyChart) Due to this being a telephonic visit, the after visit summary with patients personalized plan was offered to patient via MyChart   Notes: Nothing significant to report at this time.

## 2023-11-25 ENCOUNTER — Ambulatory Visit (INDEPENDENT_AMBULATORY_CARE_PROVIDER_SITE_OTHER): Admitting: Family

## 2023-11-25 ENCOUNTER — Encounter: Payer: Self-pay | Admitting: Family

## 2023-11-25 VITALS — BP 144/66 | HR 67 | Temp 97.1°F | Ht 62.0 in | Wt 167.4 lb

## 2023-11-25 DIAGNOSIS — F5101 Primary insomnia: Secondary | ICD-10-CM | POA: Diagnosis not present

## 2023-11-25 DIAGNOSIS — E785 Hyperlipidemia, unspecified: Secondary | ICD-10-CM | POA: Diagnosis not present

## 2023-11-25 DIAGNOSIS — D329 Benign neoplasm of meninges, unspecified: Secondary | ICD-10-CM

## 2023-11-25 DIAGNOSIS — F411 Generalized anxiety disorder: Secondary | ICD-10-CM

## 2023-11-25 DIAGNOSIS — E1169 Type 2 diabetes mellitus with other specified complication: Secondary | ICD-10-CM

## 2023-11-25 DIAGNOSIS — I10 Essential (primary) hypertension: Secondary | ICD-10-CM | POA: Diagnosis not present

## 2023-11-25 DIAGNOSIS — R569 Unspecified convulsions: Secondary | ICD-10-CM | POA: Diagnosis not present

## 2023-11-25 DIAGNOSIS — E669 Obesity, unspecified: Secondary | ICD-10-CM | POA: Diagnosis not present

## 2023-11-25 DIAGNOSIS — Z86718 Personal history of other venous thrombosis and embolism: Secondary | ICD-10-CM

## 2023-11-25 DIAGNOSIS — F320 Major depressive disorder, single episode, mild: Secondary | ICD-10-CM

## 2023-11-25 LAB — CMP14+EGFR
ALT: 36 IU/L — ABNORMAL HIGH (ref 0–32)
AST: 30 IU/L (ref 0–40)
Albumin: 4.4 g/dL (ref 3.8–4.8)
Alkaline Phosphatase: 36 IU/L — ABNORMAL LOW (ref 49–135)
BUN/Creatinine Ratio: 30 — ABNORMAL HIGH (ref 12–28)
BUN: 22 mg/dL (ref 8–27)
Bilirubin Total: 0.5 mg/dL (ref 0.0–1.2)
CO2: 21 mmol/L (ref 20–29)
Calcium: 9.7 mg/dL (ref 8.7–10.3)
Chloride: 103 mmol/L (ref 96–106)
Creatinine, Ser: 0.74 mg/dL (ref 0.57–1.00)
Globulin, Total: 2.9 g/dL (ref 1.5–4.5)
Glucose: 100 mg/dL — ABNORMAL HIGH (ref 70–99)
Potassium: 4 mmol/L (ref 3.5–5.2)
Sodium: 140 mmol/L (ref 134–144)
Total Protein: 7.3 g/dL (ref 6.0–8.5)
eGFR: 83 mL/min/1.73 (ref >59)

## 2023-11-25 MED ORDER — LISINOPRIL 40 MG PO TABS: 40.0000 mg | ORAL_TABLET | Freq: Every day | ORAL | 2 refills | Status: AC

## 2023-11-25 MED ORDER — CHLORTHALIDONE 25 MG PO TABS: 12.5000 mg | ORAL_TABLET | Freq: Every day | ORAL | 1 refills | Status: AC

## 2023-11-25 NOTE — Patient Instructions (Signed)
 Hypertension, Adult High blood pressure (hypertension) is when the force of blood pumping through the arteries is too strong. The arteries are the blood vessels that carry blood from the heart throughout the body. Hypertension forces the heart to work harder to pump blood and may cause arteries to become narrow or stiff. Untreated or uncontrolled hypertension can lead to a heart attack, heart failure, a stroke, kidney disease, and other problems. A blood pressure reading consists of a higher number over a lower number. Ideally, your blood pressure should be below 120/80. The first ("top") number is called the systolic pressure. It is a measure of the pressure in your arteries as your heart beats. The second ("bottom") number is called the diastolic pressure. It is a measure of the pressure in your arteries as the heart relaxes. What are the causes? The exact cause of this condition is not known. There are some conditions that result in high blood pressure. What increases the risk? Certain factors may make you more likely to develop high blood pressure. Some of these risk factors are under your control, including: Smoking. Not getting enough exercise or physical activity. Being overweight. Having too much fat, sugar, calories, or salt (sodium) in your diet. Drinking too much alcohol. Other risk factors include: Having a personal history of heart disease, diabetes, high cholesterol, or kidney disease. Stress. Having a family history of high blood pressure and high cholesterol. Having obstructive sleep apnea. Age. The risk increases with age. What are the signs or symptoms? High blood pressure may not cause symptoms. Very high blood pressure (hypertensive crisis) may cause: Headache. Fast or irregular heartbeats (palpitations). Shortness of breath. Nosebleed. Nausea and vomiting. Vision changes. Severe chest pain, dizziness, and seizures. How is this diagnosed? This condition is diagnosed by  measuring your blood pressure while you are seated, with your arm resting on a flat surface, your legs uncrossed, and your feet flat on the floor. The cuff of the blood pressure monitor will be placed directly against the skin of your upper arm at the level of your heart. Blood pressure should be measured at least twice using the same arm. Certain conditions can cause a difference in blood pressure between your right and left arms. If you have a high blood pressure reading during one visit or you have normal blood pressure with other risk factors, you may be asked to: Return on a different day to have your blood pressure checked again. Monitor your blood pressure at home for 1 week or longer. If you are diagnosed with hypertension, you may have other blood or imaging tests to help your health care provider understand your overall risk for other conditions. How is this treated? This condition is treated by making healthy lifestyle changes, such as eating healthy foods, exercising more, and reducing your alcohol intake. You may be referred for counseling on a healthy diet and physical activity. Your health care provider may prescribe medicine if lifestyle changes are not enough to get your blood pressure under control and if: Your systolic blood pressure is above 130. Your diastolic blood pressure is above 80. Your personal target blood pressure may vary depending on your medical conditions, your age, and other factors. Follow these instructions at home: Eating and drinking  Eat a diet that is high in fiber and potassium, and low in sodium, added sugar, and fat. An example of this eating plan is called the DASH diet. DASH stands for Dietary Approaches to Stop Hypertension. To eat this way: Eat  plenty of fresh fruits and vegetables. Try to fill one half of your plate at each meal with fruits and vegetables. Eat whole grains, such as whole-wheat pasta, brown rice, or whole-grain bread. Fill about one  fourth of your plate with whole grains. Eat or drink low-fat dairy products, such as skim milk or low-fat yogurt. Avoid fatty cuts of meat, processed or cured meats, and poultry with skin. Fill about one fourth of your plate with lean proteins, such as fish, chicken without skin, beans, eggs, or tofu. Avoid pre-made and processed foods. These tend to be higher in sodium, added sugar, and fat. Reduce your daily sodium intake. Many people with hypertension should eat less than 1,500 mg of sodium a day. Do not drink alcohol if: Your health care provider tells you not to drink. You are pregnant, may be pregnant, or are planning to become pregnant. If you drink alcohol: Limit how much you have to: 0-1 drink a day for women. 0-2 drinks a day for men. Know how much alcohol is in your drink. In the [place] one drink equals one 12 oz bottle of beer (355 mL), one 5 oz glass of wine (148 mL), or one 1 oz glass of hard liquor (44 mL). Lifestyle  Work with your health care provider to maintain a healthy body weight or to lose weight. Ask what an ideal weight is for you. Get at least 30 minutes of exercise that causes your heart to beat faster (aerobic exercise) most days of the week. Activities may include walking, swimming, or biking. Include exercise to strengthen your muscles (resistance exercise), such as Pilates or lifting weights, as part of your weekly exercise routine. Try to do these types of exercises for 30 minutes at least 3 days a week. Do not use any products that contain nicotine or tobacco. These products include cigarettes, chewing tobacco, and vaping devices, such as e-cigarettes. If you need help quitting, ask your health care provider. Monitor your blood pressure at home as told by your health care provider. Keep all follow-up visits. This is important. Medicines Take over-the-counter and prescription medicines only as told by your health care provider. Follow directions carefully. Blood  pressure medicines must be taken as prescribed. Do not skip doses of blood pressure medicine. Doing this puts you at risk for problems and can make the medicine less effective. Ask your health care provider about side effects or reactions to medicines that you should watch for. Contact a health care provider if you: Think you are having a reaction to a medicine you are taking. Have headaches that keep coming back (recurring). Feel dizzy. Have swelling in your ankles. Have trouble with your vision. Get help right away if you: Develop a severe headache or confusion. Have unusual weakness or numbness. Feel faint. Have severe pain in your chest or abdomen. Vomit repeatedly. Have trouble breathing. These symptoms may be an emergency. Get help right away. Call 911. Do not wait to see if the symptoms will go away. Do not drive yourself to the hospital. Summary Hypertension is when the force of blood pumping through your arteries is too strong. If this condition is not controlled, it may put you at risk for serious complications. Your personal target blood pressure may vary depending on your medical conditions, your age, and other factors. For most people, a normal blood pressure is less than 120/80. Hypertension is treated with lifestyle changes, medicines, or a combination of both. Lifestyle changes include losing weight, eating a healthy,  low-sodium diet, exercising more, and limiting alcohol. This information is not intended to replace advice given to you by your health care provider. Make sure you discuss any questions you have with your health care provider. Document Revised: 11/28/2020 Document Reviewed: 11/28/2020 Elsevier Patient Education  2024 [employer].

## 2023-11-25 NOTE — Progress Notes (Signed)
 Subjective:    Patient ID: Christina Gonzales, female    DOB: 1945/11/26, 78 y.o.   MRN: [id number]  Chief Complaint  Patient presents with   Medical Management of Chronic Issues   Pt presents to the office today with chronic follow up.  She is followed by Neurologists for seizures and meningioma annually.  These are stable.    She takes Eliquis  5 mg for hx of DVT.   Reports her daughter died throat cancer age 41 in 23-Mar-2023.   Hypertension This is a chronic problem. The current episode started more than 1 year ago. The problem has been resolved since onset. The problem is controlled. Associated symptoms include anxiety. Pertinent negatives include no blurred vision, malaise/fatigue, peripheral edema or shortness of breath. Risk factors for coronary artery disease include dyslipidemia, diabetes mellitus and sedentary lifestyle. The current treatment provides moderate improvement.  Hyperlipidemia This is a chronic problem. The current episode started more than 1 year ago. The problem is controlled. Recent lipid tests were reviewed and are normal. Pertinent negatives include no shortness of breath. Current antihyperlipidemic treatment includes statins. The current treatment provides moderate improvement of lipids. Risk factors for coronary artery disease include dyslipidemia, diabetes mellitus, hypertension, a sedentary lifestyle and post-menopausal.  Diabetes She presents for her follow-up diabetic visit. She has type 2 diabetes mellitus. Hypoglycemia symptoms include nervousness/anxiousness. Pertinent negatives for diabetes include no blurred vision, no foot paresthesias and no foot ulcerations. Symptoms are stable. Risk factors for coronary artery disease include diabetes mellitus, dyslipidemia, hypertension, sedentary lifestyle and post-menopausal. She is following a generally unhealthy diet. Her overall blood glucose range is 130-140 mg/dl.  Insomnia Primary symptoms: sleep disturbance,  difficulty falling asleep, frequent awakening, no malaise/fatigue.   The current episode started more than one year. The onset quality is gradual. Past treatments include medication. The treatment provided moderate relief. PMH includes: depression.   Anxiety Presents for follow-up visit. Symptoms include excessive worry, insomnia, nervous/anxious behavior and restlessness. Patient reports no shortness of breath. Symptoms occur occasionally. The severity of symptoms is mild.    Depression        This is a chronic problem.  The current episode started more than 1 year ago.   The problem occurs intermittently.  Associated symptoms include insomnia and restlessness.  Associated symptoms include no helplessness, no hopelessness and not sad.  Past treatments include nothing.  Past medical history includes anxiety.        Review of Systems  Constitutional:  Negative for malaise/fatigue.  Eyes:  Negative for blurred vision.  Respiratory:  Negative for shortness of breath.   Psychiatric/Behavioral:  Positive for depression and sleep disturbance. The patient is nervous/anxious and has insomnia.   All other systems reviewed and are negative.   Social History   Socioeconomic History   Marital status: Married    Spouse name: Elsie   Number of children: 3   Years of education: 12   Highest education level: 12th grade  Occupational History   Not on file  Tobacco Use   Smoking status: Never   Smokeless tobacco: Never  Vaping Use   Vaping status: Never Used  Substance and Sexual Activity   Alcohol use: No   Drug use: No   Sexual activity: Not Currently  Other Topics Concern   Not on file  Social History Narrative   Retired, lives with husband [place]. Two daughters living, one son deceased. Enjoys walking.    Social Drivers of Corporate investment banker  Strain: Low Risk  (11/20/2023)   Overall Financial Resource Strain (CARDIA)    Difficulty of Paying Living Expenses: Not hard at  all  Food Insecurity: No Food Insecurity (11/20/2023)   Hunger Vital Sign    Worried About Running Out of Food in the Last Year: Never true    Ran Out of Food in the Last Year: Never true  Transportation Needs: No Transportation Needs (11/20/2023)   PRAPARE - Administrator, Civil Service (Medical): No    Lack of Transportation (Non-Medical): No  Physical Activity: Sufficiently Active (11/20/2023)   Exercise Vital Sign    Days of Exercise per Week: 6 days    Minutes of Exercise per Session: 30 min  Stress: No Stress Concern Present (11/20/2023)   [employer] of Occupational Health - Occupational Stress Questionnaire    Feeling of Stress: Not at all  Social Connections: Socially Integrated (11/20/2023)   Social Connection and Isolation Panel    Frequency of Communication with Friends and Family: More than three times a week    Frequency of Social Gatherings with Friends and Family: More than three times a week    Attends Religious Services: More than 4 times per year    Active Member of [employer] or Organizations: Yes    Attends Engineer, structural: More than 4 times per year    Marital Status: Married   Family History  Problem Relation Age of Onset   Cancer Mother        lung   Diabetes Brother         Objective:   Physical Exam Vitals reviewed.  Constitutional:      General: She is not in acute distress.    Appearance: She is well-developed.  HENT:     Head: Normocephalic and atraumatic.     Right Ear: Tympanic membrane normal.     Left Ear: Tympanic membrane normal.  Eyes:     Pupils: Pupils are equal, round, and reactive to light.  Neck:     Thyroid : No thyromegaly.  Cardiovascular:     Rate and Rhythm: Normal rate and regular rhythm.     Heart sounds: Normal heart sounds. No murmur heard. Pulmonary:     Effort: Pulmonary effort is normal. No respiratory distress.     Breath sounds: Normal breath sounds. No wheezing.  Abdominal:      General: Bowel sounds are normal. There is no distension.     Palpations: Abdomen is soft.     Tenderness: There is no abdominal tenderness.  Musculoskeletal:        General: No tenderness. Normal range of motion.     Cervical back: Normal range of motion and neck supple.  Skin:    General: Skin is warm and dry.  Neurological:     Mental Status: She is alert and oriented to person, place, and time.     Cranial Nerves: No cranial nerve deficit.     Deep Tendon Reflexes: Reflexes are normal and symmetric.  Psychiatric:        Behavior: Behavior normal.        Thought Content: Thought content normal.        Judgment: Judgment normal.       BP (!) 144/66   Pulse 67   Temp (!) 97.1 F (36.2 C) (Temporal)   Ht 5' 2 (1.575 m)   Wt 167 lb 6.4 oz (75.9 kg)   SpO2 98%   BMI 30.62 kg/m  Assessment & Plan:  GABBI WHETSTONE comes in today with chief complaint of Medical Management of Chronic Issues   Diagnosis and orders addressed:  1. Essential hypertension, benign -Will start hygroton 12.5 mg -Daily blood pressure log given with instructions on how to fill out and told to bring to next visit -Dash diet information given -Exercise encouraged - Stress Management  -Continue current meds -RTO in 2-3 weeks  - lisinopril  (ZESTRIL ) 40 MG tablet; Take 1 tablet (40 mg total) by mouth daily.  Dispense: 90 tablet; Refill: 2 - chlorthalidone (HYGROTON) 25 MG tablet; Take 0.5 tablets (12.5 mg total) by mouth daily.  Dispense: 45 tablet; Refill: 1 - CMP14+EGFR  2. Seizures (HCC) (Primary) - CMP14+EGFR  3. Primary insomnia - CMP14+EGFR  4. Obesity (BMI 30-39.9) - CMP14+EGFR  5. Meningioma (HCC) - CMP14+EGFR  6. Hyperlipidemia associated with type 2 diabetes mellitus (HCC) - CMP14+EGFR  7. History of deep vein thrombosis (DVT) of lower extremity - CMP14+EGFR  8. GAD (generalized anxiety disorder) - CMP14+EGFR  9. Type 2 diabetes mellitus with other specified  complication, without long-term current use of insulin  (HCC) - Microalbumin / creatinine urine ratio - CMP14+EGFR  10. Current mild episode of major depressive disorder without prior episode - CMP14+EGFR   Labs pending Start hygroton 12.5 mg Avoid caffeine  Continue current medications  Keep follow up with specialists  Health Maintenance reviewed Diet and exercise encouraged  Return in about 2 weeks (around 12/09/2023), or if symptoms worsen or fail to improve.    Bari Learn, FNP

## 2023-11-26 LAB — MICROALBUMIN / CREATININE URINE RATIO
Creatinine, Urine: 25.6 mg/dL
Microalb/Creat Ratio: <12 mg/g{creat} (ref 0–29)
Microalbumin, Urine: <3 ug/mL

## 2023-11-27 ENCOUNTER — Ambulatory Visit: Payer: Self-pay | Admitting: Family

## 2023-12-02 NOTE — Progress Notes (Signed)
 Christina Gonzales                                          MRN: [id number]   12/02/2023   The [facility] Quality Team Specialist reviewed this patient medical record for the purposes of chart review for care gap closure. The following were reviewed: abstraction for care gap closure-kidney health evaluation for diabetes:eGFR  and uACR.    [facility] Quality Team

## 2023-12-09 ENCOUNTER — Ambulatory Visit: Payer: Self-pay | Admitting: Family

## 2023-12-09 ENCOUNTER — Encounter: Payer: Self-pay | Admitting: Family

## 2023-12-09 VITALS — BP 120/68 | HR 69 | Temp 97.7°F | Ht 62.0 in | Wt 163.0 lb

## 2023-12-09 DIAGNOSIS — I10 Essential (primary) hypertension: Secondary | ICD-10-CM

## 2023-12-09 DIAGNOSIS — B3731 Acute candidiasis of vulva and vagina: Secondary | ICD-10-CM

## 2023-12-09 LAB — OPHTHALMOLOGY REPORT-SCANNED

## 2023-12-09 MED ORDER — FLUCONAZOLE 150 MG PO TABS: 150.0000 mg | ORAL_TABLET | ORAL | 0 refills | Status: AC

## 2023-12-09 NOTE — Patient Instructions (Signed)
 Hypertension, Adult High blood pressure (hypertension) is when the force of blood pumping through the arteries is too strong. The arteries are the blood vessels that carry blood from the heart throughout the body. Hypertension forces the heart to work harder to pump blood and may cause arteries to become narrow or stiff. Untreated or uncontrolled hypertension can lead to a heart attack, heart failure, a stroke, kidney disease, and other problems. A blood pressure reading consists of a higher number over a lower number. Ideally, your blood pressure should be below 120/80. The first ("top") number is called the systolic pressure. It is a measure of the pressure in your arteries as your heart beats. The second ("bottom") number is called the diastolic pressure. It is a measure of the pressure in your arteries as the heart relaxes. What are the causes? The exact cause of this condition is not known. There are some conditions that result in high blood pressure. What increases the risk? Certain factors may make you more likely to develop high blood pressure. Some of these risk factors are under your control, including: Smoking. Not getting enough exercise or physical activity. Being overweight. Having too much fat, sugar, calories, or salt (sodium) in your diet. Drinking too much alcohol. Other risk factors include: Having a personal history of heart disease, diabetes, high cholesterol, or kidney disease. Stress. Having a family history of high blood pressure and high cholesterol. Having obstructive sleep apnea. Age. The risk increases with age. What are the signs or symptoms? High blood pressure may not cause symptoms. Very high blood pressure (hypertensive crisis) may cause: Headache. Fast or irregular heartbeats (palpitations). Shortness of breath. Nosebleed. Nausea and vomiting. Vision changes. Severe chest pain, dizziness, and seizures. How is this diagnosed? This condition is diagnosed by  measuring your blood pressure while you are seated, with your arm resting on a flat surface, your legs uncrossed, and your feet flat on the floor. The cuff of the blood pressure monitor will be placed directly against the skin of your upper arm at the level of your heart. Blood pressure should be measured at least twice using the same arm. Certain conditions can cause a difference in blood pressure between your right and left arms. If you have a high blood pressure reading during one visit or you have normal blood pressure with other risk factors, you may be asked to: Return on a different day to have your blood pressure checked again. Monitor your blood pressure at home for 1 week or longer. If you are diagnosed with hypertension, you may have other blood or imaging tests to help your health care provider understand your overall risk for other conditions. How is this treated? This condition is treated by making healthy lifestyle changes, such as eating healthy foods, exercising more, and reducing your alcohol intake. You may be referred for counseling on a healthy diet and physical activity. Your health care provider may prescribe medicine if lifestyle changes are not enough to get your blood pressure under control and if: Your systolic blood pressure is above 130. Your diastolic blood pressure is above 80. Your personal target blood pressure may vary depending on your medical conditions, your age, and other factors. Follow these instructions at home: Eating and drinking  Eat a diet that is high in fiber and potassium, and low in sodium, added sugar, and fat. An example of this eating plan is called the DASH diet. DASH stands for Dietary Approaches to Stop Hypertension. To eat this way: Eat  plenty of fresh fruits and vegetables. Try to fill one half of your plate at each meal with fruits and vegetables. Eat whole grains, such as whole-wheat pasta, brown rice, or whole-grain bread. Fill about one  fourth of your plate with whole grains. Eat or drink low-fat dairy products, such as skim milk or low-fat yogurt. Avoid fatty cuts of meat, processed or cured meats, and poultry with skin. Fill about one fourth of your plate with lean proteins, such as fish, chicken without skin, beans, eggs, or tofu. Avoid pre-made and processed foods. These tend to be higher in sodium, added sugar, and fat. Reduce your daily sodium intake. Many people with hypertension should eat less than 1,500 mg of sodium a day. Do not drink alcohol if: Your health care provider tells you not to drink. You are pregnant, may be pregnant, or are planning to become pregnant. If you drink alcohol: Limit how much you have to: 0-1 drink a day for women. 0-2 drinks a day for men. Know how much alcohol is in your drink. In the [place] one drink equals one 12 oz bottle of beer (355 mL), one 5 oz glass of wine (148 mL), or one 1 oz glass of hard liquor (44 mL). Lifestyle  Work with your health care provider to maintain a healthy body weight or to lose weight. Ask what an ideal weight is for you. Get at least 30 minutes of exercise that causes your heart to beat faster (aerobic exercise) most days of the week. Activities may include walking, swimming, or biking. Include exercise to strengthen your muscles (resistance exercise), such as Pilates or lifting weights, as part of your weekly exercise routine. Try to do these types of exercises for 30 minutes at least 3 days a week. Do not use any products that contain nicotine or tobacco. These products include cigarettes, chewing tobacco, and vaping devices, such as e-cigarettes. If you need help quitting, ask your health care provider. Monitor your blood pressure at home as told by your health care provider. Keep all follow-up visits. This is important. Medicines Take over-the-counter and prescription medicines only as told by your health care provider. Follow directions carefully. Blood  pressure medicines must be taken as prescribed. Do not skip doses of blood pressure medicine. Doing this puts you at risk for problems and can make the medicine less effective. Ask your health care provider about side effects or reactions to medicines that you should watch for. Contact a health care provider if you: Think you are having a reaction to a medicine you are taking. Have headaches that keep coming back (recurring). Feel dizzy. Have swelling in your ankles. Have trouble with your vision. Get help right away if you: Develop a severe headache or confusion. Have unusual weakness or numbness. Feel faint. Have severe pain in your chest or abdomen. Vomit repeatedly. Have trouble breathing. These symptoms may be an emergency. Get help right away. Call 911. Do not wait to see if the symptoms will go away. Do not drive yourself to the hospital. Summary Hypertension is when the force of blood pumping through your arteries is too strong. If this condition is not controlled, it may put you at risk for serious complications. Your personal target blood pressure may vary depending on your medical conditions, your age, and other factors. For most people, a normal blood pressure is less than 120/80. Hypertension is treated with lifestyle changes, medicines, or a combination of both. Lifestyle changes include losing weight, eating a healthy,  low-sodium diet, exercising more, and limiting alcohol. This information is not intended to replace advice given to you by your health care provider. Make sure you discuss any questions you have with your health care provider. Document Revised: 11/28/2020 Document Reviewed: 11/28/2020 Elsevier Patient Education  2024 [employer].

## 2023-12-09 NOTE — Progress Notes (Signed)
 Subjective:    Patient ID: Christina Gonzales, female    DOB: 1945-08-24, 78 y.o.   MRN: [id number]  Chief Complaint  Patient presents with   Follow-up   Pt presents to the office today to recheck HTN. She was seen on 11/25/23 and started on hygroton 12.5 mg.  Hypertension This is a chronic problem. The current episode started more than 1 year ago. The problem has been waxing and waning since onset. The problem is uncontrolled.  Vaginal Itching The patient's primary symptoms include genital itching. The patient's pertinent negatives include no genital odor or vaginal discharge.      Review of Systems  Genitourinary:  Negative for vaginal discharge.  All other systems reviewed and are negative.   Social History   Socioeconomic History   Marital status: Married    Spouse name: Elsie   Number of children: 3   Years of education: 12   Highest education level: 12th grade  Occupational History   Not on file  Tobacco Use   Smoking status: Never   Smokeless tobacco: Never  Vaping Use   Vaping status: Never Used  Substance and Sexual Activity   Alcohol use: No   Drug use: No   Sexual activity: Not Currently  Other Topics Concern   Not on file  Social History Narrative   Retired, lives with husband [place]. Two daughters living, one son deceased. Enjoys walking.    Social Drivers of Corporate Investment Banker Strain: Low Risk  (11/20/2023)   Overall Financial Resource Strain (CARDIA)    Difficulty of Paying Living Expenses: Not hard at all  Food Insecurity: No Food Insecurity (11/20/2023)   Hunger Vital Sign    Worried About Running Out of Food in the Last Year: Never true    Ran Out of Food in the Last Year: Never true  Transportation Needs: No Transportation Needs (11/20/2023)   PRAPARE - Administrator, Civil Service (Medical): No    Lack of Transportation (Non-Medical): No  Physical Activity: Sufficiently Active (11/20/2023)   Exercise Vital  Sign    Days of Exercise per Week: 6 days    Minutes of Exercise per Session: 30 min  Stress: No Stress Concern Present (11/20/2023)   [employer] of Occupational Health - Occupational Stress Questionnaire    Feeling of Stress: Not at all  Social Connections: Socially Integrated (11/20/2023)   Social Connection and Isolation Panel    Frequency of Communication with Friends and Family: More than three times a week    Frequency of Social Gatherings with Friends and Family: More than three times a week    Attends Religious Services: More than 4 times per year    Active Member of [employer] or Organizations: Yes    Attends Engineer, Structural: More than 4 times per year    Marital Status: Married   Family History  Problem Relation Age of Onset   Cancer Mother        lung   Diabetes Brother         Objective:   Physical Exam Vitals reviewed.  Constitutional:      General: She is not in acute distress.    Appearance: She is well-developed.  HENT:     Head: Normocephalic and atraumatic.     Right Ear: Tympanic membrane normal.     Left Ear: Tympanic membrane normal.  Eyes:     Pupils: Pupils are equal, round, and reactive to light.  Neck:     Thyroid : No thyromegaly.  Cardiovascular:     Rate and Rhythm: Normal rate and regular rhythm.     Heart sounds: Normal heart sounds. No murmur heard. Pulmonary:     Effort: Pulmonary effort is normal. No respiratory distress.     Breath sounds: Normal breath sounds. No wheezing.  Abdominal:     General: Bowel sounds are normal. There is no distension.     Palpations: Abdomen is soft.     Tenderness: There is no abdominal tenderness.  Musculoskeletal:        General: No tenderness. Normal range of motion.     Cervical back: Normal range of motion and neck supple.  Skin:    General: Skin is warm and dry.  Neurological:     Mental Status: She is alert and oriented to person, place, and time.     Cranial Nerves: No cranial  nerve deficit.     Deep Tendon Reflexes: Reflexes are normal and symmetric.  Psychiatric:        Behavior: Behavior normal.        Thought Content: Thought content normal.        Judgment: Judgment normal.       BP (!) 152/50   Pulse 69   Temp 97.7 F (36.5 C) (Temporal)   Ht 5' 2 (1.575 m)   Wt 163 lb (73.9 kg)   BMI 29.81 kg/m      Assessment & Plan:  TAYLIE HELDER comes in today with chief complaint of Follow-up   Diagnosis and orders addressed:  1. Essential hypertension, benign (Primary) At goal  -Dash diet information given -Exercise encouraged - Stress Management  -Continue current meds -RTO in 3 months  - BMP8+EGFR  2. Vagina, candidiasis Keep clean and dry Avoid itching Eat yogurt Need to have good control of glucose - fluconazole  (DIFLUCAN ) 150 MG tablet; Take 1 tablet (150 mg total) by mouth every 3 (three) days.  Dispense: 3 tablet; Refill: 0      Bari Learn, FNP

## 2023-12-10 LAB — BMP8+EGFR
BUN/Creatinine Ratio: 33 — ABNORMAL HIGH (ref 12–28)
BUN: 28 mg/dL — ABNORMAL HIGH (ref 8–27)
CO2: 23 mmol/L (ref 20–29)
Calcium: 9.9 mg/dL (ref 8.7–10.3)
Chloride: 103 mmol/L (ref 96–106)
Creatinine, Ser: 0.84 mg/dL (ref 0.57–1.00)
Glucose: 127 mg/dL — ABNORMAL HIGH (ref 70–99)
Potassium: 4.8 mmol/L (ref 3.5–5.2)
Sodium: 143 mmol/L (ref 134–144)
eGFR: 71 mL/min/1.73 (ref >59)

## 2023-12-11 ENCOUNTER — Ambulatory Visit: Payer: Self-pay | Admitting: Family

## 2023-12-17 ENCOUNTER — Emergency Department (HOSPITAL_COMMUNITY)

## 2023-12-17 ENCOUNTER — Other Ambulatory Visit: Payer: Self-pay

## 2023-12-17 ENCOUNTER — Emergency Department (HOSPITAL_COMMUNITY)
Admission: EM | Admit: 2023-12-17 | Discharge: 2023-12-17 | Disposition: A | Discharge status: Home / Self Care | Attending: Emergency Medicine | Admitting: Emergency Medicine

## 2023-12-17 ENCOUNTER — Encounter (HOSPITAL_COMMUNITY): Payer: Self-pay | Admitting: Emergency Medicine

## 2023-12-17 DIAGNOSIS — L988 Other specified disorders of the skin and subcutaneous tissue: Secondary | ICD-10-CM | POA: Diagnosis not present

## 2023-12-17 DIAGNOSIS — R0789 Other chest pain: Secondary | ICD-10-CM | POA: Insufficient documentation

## 2023-12-17 DIAGNOSIS — Z79899 Other long term (current) drug therapy: Secondary | ICD-10-CM | POA: Insufficient documentation

## 2023-12-17 DIAGNOSIS — R059 Cough, unspecified: Secondary | ICD-10-CM | POA: Insufficient documentation

## 2023-12-17 DIAGNOSIS — Z7901 Long term (current) use of anticoagulants: Secondary | ICD-10-CM | POA: Diagnosis not present

## 2023-12-17 DIAGNOSIS — D492 Neoplasm of unspecified behavior of bone, soft tissue, and skin: Secondary | ICD-10-CM

## 2023-12-17 LAB — D-DIMER, QUANTITATIVE: D-Dimer, Quant: 0.62 ug{FEU}/mL — ABNORMAL HIGH (ref 0.00–0.50)

## 2023-12-17 LAB — TROPONIN T, HIGH SENSITIVITY
Troponin T High Sensitivity: 30 ng/L — ABNORMAL HIGH (ref 0–19)
Troponin T High Sensitivity: 25 ng/L — ABNORMAL HIGH (ref 0–19)

## 2023-12-17 LAB — CBC
HCT: 42.2 % (ref 36.0–46.0)
Hemoglobin: 14.2 g/dL (ref 12.0–15.0)
MCH: 29.5 pg (ref 26.0–34.0)
MCHC: 33.6 g/dL (ref 30.0–36.0)
MCV: 87.6 fL (ref 80.0–100.0)
Platelets: 235 K/uL (ref 150–400)
RBC: 4.82 MIL/uL (ref 3.87–5.11)
RDW: 12.5 % (ref 11.5–15.5)
WBC: 11.7 K/uL — ABNORMAL HIGH (ref 4.0–10.5)
nRBC: 0 % (ref 0.0–0.2)

## 2023-12-17 LAB — BASIC METABOLIC PANEL WITH GFR
Anion gap: 15 (ref 5–15)
BUN: 29 mg/dL — ABNORMAL HIGH (ref 8–23)
CO2: 24 mmol/L (ref 22–32)
Calcium: 10.1 mg/dL (ref 8.9–10.3)
Chloride: 101 mmol/L (ref 98–111)
Creatinine, Ser: 0.86 mg/dL (ref 0.44–1.00)
GFR, Estimated: >60 mL/min (ref >60)
Glucose, Bld: 118 mg/dL — ABNORMAL HIGH (ref 70–99)
Potassium: 5 mmol/L (ref 3.5–5.1)
Sodium: 140 mmol/L (ref 135–145)

## 2023-12-17 NOTE — Discharge Instructions (Addendum)
 Thank you for coming to [facility]. You were seen for chest pain, skin growth. We did an exam, labs, and imaging, and these showed no acute findings.  - Follow-up with a cardiologist for your intermittent chest pain.  They will call you to make an appointment.  Please follow-up within the next week. -You can follow-up with your primary care doctor or dermatologist for your skin growth on your left wrist.  It may need to be removed.  Do not hesitate to return to the ED or call 911 if you experience: -Worsening symptoms -Lightheadedness, passing out -Fevers/chills -Anything else that concerns you

## 2023-12-17 NOTE — ED Provider Notes (Signed)
 [place] EMERGENCY DEPARTMENT AT [facility] Provider Note   CSN: [id number] Arrival date & time: 12/17/23  1215     History  Chief Complaint  Patient presents with   Chest Pain    Christina Gonzales is a 78 y.o. female with PMH as listed below who presents with intermittent CP since Monday.  Patient first complains of a mole/skin tag on her left wrist that she has never had before. When asked what brought her to the ED today she reports chest pain that has been coming and going, radiating to both arms. Pain has been going on for months. Reports pain when she is rushing herself. Endorses a mild cough once in a while. Reports being recently treated for a UTI and discusses her intermittent urinary incontinence which is chronic for her. Has a history of DVT and is on eliquis . No missed doses of eliquis , no h/o PE. Not currently experiencing any pain. Never seen a cardiologist before.   Past Medical History:  Diagnosis Date   Anxiety    DVT (deep venous thrombosis) (HCC)    Hyperlipidemia    Seizures (HCC)        Home Medications Prior to Admission medications   Medication Sig Start Date End Date Taking? Authorizing Provider  amLODipine  (NORVASC ) 10 MG tablet Take 1 tablet (10 mg total) by mouth daily. 04/29/23   Lavell Lye A, FNP  apixaban  (ELIQUIS ) 5 MG TABS tablet Take 1 tablet (5 mg total) by mouth 2 (two) times daily. 09/22/23   Lamon Pleasant HERO, PA-C  atorvastatin  (LIPITOR) 20 MG tablet Take 1 tablet (20 mg total) by mouth daily. 08/21/23   Lavell Lye LABOR, FNP  blood glucose meter kit and supplies Dispense based on patient and insurance preference. Use up to four times daily as directed. (FOR ICD-10 E10.9, E11.9). 07/26/19   Lavell Lye A, FNP  Blood Glucose Monitoring Suppl DEVI 1 each by Does not apply route in the morning, at noon, and at bedtime. May substitute to any manufacturer covered by patient's insurance. 04/10/22   Lavell Lye LABOR, FNP   chlorthalidone (HYGROTON) 25 MG tablet Take 0.5 tablets (12.5 mg total) by mouth daily. 11/25/23   Lavell Lye LABOR, FNP  Cyanocobalamin  (VITAMIN B-12) 3000 MCG SUBL Place 3,000 mcg under the tongue daily. 09/22/23   Lamon Pleasant HERO, PA-C  dapagliflozin  propanediol (FARXIGA ) 10 MG TABS tablet Take 1 tablet (10 mg total) by mouth daily before breakfast. 05/15/23   Lavell Lye LABOR, FNP  ferrous sulfate  325 (65 FE) MG EC tablet Take 1 tablet (325 mg total) by mouth daily with breakfast. Take with orange juice or Vitamin C to improve absorption.  Can decrease to every other day if experiencing constipation.  Take with food to minimize GI upset. 09/22/23   Lamon Pleasant HERO, PA-C  fluconazole  (DIFLUCAN ) 150 MG tablet Take 1 tablet (150 mg total) by mouth every 3 (three) days. 12/09/23   Lavell Lye LABOR, FNP  Lancets [facility] DELICA PLUS [place]) MISC Check twice a day and as needed 12/14/19   Lavell Lye LABOR, FNP  levETIRAcetam  (KEPPRA ) 500 MG tablet Take 500 mg by mouth 2 (two) times daily. 11/30/20   [provider]  lisinopril  (ZESTRIL ) 40 MG tablet Take 1 tablet (40 mg total) by mouth daily. 11/25/23   Lavell Lye LABOR, FNP  Multiple Vitamins tablet Take 1 tablet by mouth. Reported on 05/26/2015    [provider]  [facility] ULTRA test strip 1 each by  Other route 4 (four) times daily. 12/14/19   Lavell Lye A, FNP  ramelteon  (ROZEREM ) 8 MG tablet Take 1 tablet (8 mg total) by mouth at bedtime. 08/21/23   Lavell Lye A, FNP  Semaglutide  (RYBELSUS ) 7 MG TABS Take 1 tablet (7 mg total) by mouth daily. 08/21/23   Lavell Lye A, FNP  Vitamin D , Ergocalciferol , (DRISDOL ) 1.25 MG (50000 UNIT) CAPS capsule Take 1 capsule (50,000 Units total) by mouth every 14 (fourteen) days. 09/22/23   Lamon Pleasant HERO, PA-C      Allergies    Heparin, Warfarin, and Penicillins    Review of Systems   Review of Systems A 10 point review of systems was performed and is negative unless  otherwise reported in HPI.  Physical Exam Updated Vital Signs BP (!) 130/53   Pulse 76   Temp 98.1 F (36.7 C) (Oral)   Resp (!) 21   Ht 5' 2 (1.575 m)   Wt 75.3 kg   SpO2 95%   BMI 30.36 kg/m  Physical Exam General: Normal appearing female, lying in bed.  HEENT: PERRLA, Sclera anicteric, MMM, trachea midline.  Cardiology: RRR, no murmurs/rubs/gallops. No chest wall TTP. Resp: Normal respiratory rate and effort. CTAB, no wheezes, rhonchi, crackles.  Abd: Soft, non-tender, non-distended. No rebound tenderness or guarding.  GU: Deferred. MSK: No peripheral edema or signs of trauma. Extremities without deformity or TTP. No cyanosis or clubbing. Skin: warm, dry. Small, pedunculated, verrucous appearing lesions on patient's radial left wrist. No surrounding erythema/induration/fluctuance. No ulceration. Neuro: A&Ox4, CNs II-XII grossly intact. MAEs. Sensation grossly intact.  Psych: Normal mood and affect.   ED Results / Procedures / Treatments   Labs (all labs ordered are listed, but only abnormal results are displayed) Labs Reviewed  BASIC METABOLIC PANEL WITH GFR - Abnormal; Notable for the following components:      Result Value   Glucose, Bld 118 (*)    BUN 29 (*)    All other components within normal limits  CBC - Abnormal; Notable for the following components:   WBC 11.7 (*)    All other components within normal limits  TROPONIN T, HIGH SENSITIVITY - Abnormal; Notable for the following components:   Troponin T High Sensitivity 30 (*)    All other components within normal limits  D-DIMER, QUANTITATIVE    EKG EKG Interpretation Date/Time:  Wednesday December 17 2023 12:25:41 EST Ventricular Rate:  78 PR Interval:  138 QRS Duration:  86 QT Interval:  346 QTC Calculation: 394 R Axis:   40  Text Interpretation: Normal sinus rhythm Normal ECG Confirmed by Franklyn Gills [phone]) on 12/17/2023 1:52:57 PM  Radiology DG Chest Portable 1 View Result Date:  12/17/2023 CLINICAL DATA:  Chest pain, UTI. EXAM: PORTABLE CHEST 1 VIEW COMPARISON:  06/12/2022.  CT chest 06/12/2022. FINDINGS: Trachea is midline. Heart size normal. Thoracic aorta is calcified. Mild bibasilar scarring. No airspace consolidation or pleural fluid. IMPRESSION: No acute findings. Electronically Signed   By: Newell Eke M.D.   On: 12/17/2023 13:25    Procedures Procedures    Medications Ordered in ED Medications - No data to display  ED Course/ Medical Decision Making/ A&P                          Medical Decision Making Amount and/or Complexity of Data Reviewed Labs: ordered. Decision-making details documented in ED Course. Radiology: ordered. Decision-making details documented in ED Course.    This patient  presents to the ED for concern of intermittent aching chest pain, this involves an extensive number of treatment options, and is a complaint that carries with it a high risk of complications and morbidity.  I considered the following differential and admission for this acute, potentially life threatening condition. Patient is very well-appearing. She is very mildly tachypneic but otherwise vitally stable. No resp distress.  MDM:    DDX for chest pain includes but is not limited to:  ACS/arrhythmia,  PE, aortic dissection, PNA, PTX, esophogeal rupture, biliary disease, cardiac tamponade, pericarditis, GERD/PUD/gastritis, or musculoskeletal pain. Very low suspicion for  aortic dissection given presenting sx. Must consider angina. Patient reports she has never had a cardiac stress test or really cardiac evaluation. Initial troponin is 30, will trend. EKG w/o signs of ischemia/arrhythmia. Patient will likely need f/u with cardiology.  Patient cannot PERC out based on age, but will obtain D dimer and reassess, minimal risk factors for PE. No c/f dissection. No abdominal pain and no c/f biliary disease. Consider GERD, given known history.    Advised f/u with derm/PCP  for removal of skin tag and evaluation by dermatology for skin exam/biopsy.   Clinical Course as of 12/17/23 1353  Wed Dec 17, 2023  1328 DG Chest Portable 1 View No acute findings. [HN]  1352 Troponin T High Sensitivity(!): 30 Mildly elevated, will trend [HN]    Clinical Course User Index [HN] Franklyn Sid SAILOR, MD    Labs: I Ordered, and personally interpreted labs.  The pertinent results include:  those listed above  Imaging Studies ordered: I ordered imaging studies including CXR I independently visualized and interpreted imaging. I agree with the radiologist interpretation  Additional history obtained from chart review, husband at bedside.    Social Determinants of Health: Lives independently  Disposition:  Patient is signed out to the oncoming ED physician who is made aware of her history, presentation, exam, workup, and plan.    Co morbidities that complicate the patient evaluation  Past Medical History:  Diagnosis Date   Anxiety    DVT (deep venous thrombosis) (HCC)    Hyperlipidemia    Seizures (HCC)      Medicines No orders of the defined types were placed in this encounter.   I have reviewed the patients home medicines and have made adjustments as needed  Problem List / ED Course: Problem List Items Addressed This Visit   None Visit Diagnoses       Atypical chest pain    -  Primary   Relevant Orders   Ambulatory referral to Cardiology     Abnormal skin growth                       This note was created using dictation software, which may contain spelling or grammatical errors.    Franklyn Sid SAILOR, MD 12/23/23 [phone]

## 2023-12-17 NOTE — ED Triage Notes (Signed)
 Pt presents with intermittent CP since Monday, being treated for UTI, also has received, RSV, FLU and Pneumonia vaccine in last several weeks.

## 2023-12-17 NOTE — ED Provider Notes (Signed)
 Signed out that intermittent cp for months, and that if ddimer (age adjusted) and delta trop not increasing, plan is d/c to home with outpatient f/u.  Age adjusted ddimer is normal (pts symptoms also intermittent, for months, not c/w PE, and is on eliquis , and is compliant w therapy).   Delta trop not increasing compared to initial.  Recheck pt, vitals stable, no cp or discomfort.   Pt currently appears stable for ED d/c.  Rec outpatient pcp/cardiology f/u, referral provided.  Return precautions provided.      Bernard Drivers, MD 12/17/23 [phone]

## 2023-12-17 NOTE — ED Notes (Signed)
 Pt given water. Okay'd per MD.

## 2023-12-29 ENCOUNTER — Other Ambulatory Visit: Payer: Self-pay | Admitting: Family

## 2023-12-29 DIAGNOSIS — I10 Essential (primary) hypertension: Secondary | ICD-10-CM

## 2024-01-19 ENCOUNTER — Telehealth: Payer: Self-pay

## 2024-01-19 ENCOUNTER — Other Ambulatory Visit (HOSPITAL_COMMUNITY): Payer: Self-pay

## 2024-01-19 NOTE — Telephone Encounter (Signed)
 Pharmacy Patient Advocate Encounter   Received notification from Onbase that prior authorization for Rybelsus  7 is required/requested.   Insurance verification completed.   The patient is insured through [facility].   Per test claim: PA required; PA submitted to above mentioned insurance via Latent Key/confirmation #/EOC [id number] Status is pending

## 2024-02-27 ENCOUNTER — Ambulatory Visit: Admitting: Internal Medicine

## 2024-03-03 ENCOUNTER — Ambulatory Visit: Admitting: Internal Medicine

## 2024-03-17 ENCOUNTER — Inpatient Hospital Stay

## 2024-03-24 ENCOUNTER — Ambulatory Visit: Admitting: Physician Assistant

## 2024-03-30 ENCOUNTER — Inpatient Hospital Stay: Admitting: Physician Assistant

## 2024-03-31 ENCOUNTER — Other Ambulatory Visit

## 2024-04-06 ENCOUNTER — Ambulatory Visit: Admitting: Family

## 2024-04-12 ENCOUNTER — Inpatient Hospital Stay: Admitting: Physician Assistant

## 2024-11-22 ENCOUNTER — Ambulatory Visit
